# Patient Record
Sex: Male | Born: 1965 | Hispanic: Yes | Marital: Married | Smoking: Former smoker
Health system: Southern US, Community
[De-identification: ages and names within clinical notes are randomized; demographics above are authoritative.]

## PROBLEM LIST (undated history)

## (undated) DIAGNOSIS — Z973 Presence of spectacles and contact lenses: Secondary | ICD-10-CM

## (undated) DIAGNOSIS — J189 Pneumonia, unspecified organism: Secondary | ICD-10-CM

## (undated) DIAGNOSIS — J45909 Unspecified asthma, uncomplicated: Secondary | ICD-10-CM

## (undated) DIAGNOSIS — G4730 Sleep apnea, unspecified: Secondary | ICD-10-CM

## (undated) DIAGNOSIS — I2510 Atherosclerotic heart disease of native coronary artery without angina pectoris: Secondary | ICD-10-CM

## (undated) DIAGNOSIS — T68XXXA Hypothermia, initial encounter: Secondary | ICD-10-CM

## (undated) DIAGNOSIS — E039 Hypothyroidism, unspecified: Secondary | ICD-10-CM

## (undated) DIAGNOSIS — E785 Hyperlipidemia, unspecified: Secondary | ICD-10-CM

## (undated) HISTORY — PX: CARDIAC CATHETERIZATION: SHX172

## (undated) HISTORY — PX: ANTERIOR CRUCIATE LIGAMENT REPAIR: SHX115

## (undated) HISTORY — DX: Hypothermia, initial encounter: T68.XXXA

## (undated) HISTORY — DX: Hyperlipidemia, unspecified: E78.5

## (undated) HISTORY — PX: SHOULDER ARTHROSCOPY: SHX128

## (undated) HISTORY — DX: Pneumonia, unspecified organism: J18.9

## (undated) HISTORY — PX: MEDIAL COLLATERAL LIGAMENT REPAIR, KNEE: SHX2019

## (undated) HISTORY — DX: Sleep apnea, unspecified: G47.30

---

## 2004-10-11 ENCOUNTER — Ambulatory Visit: Payer: Self-pay | Admitting: Internal Medicine

## 2005-01-03 ENCOUNTER — Ambulatory Visit: Payer: Self-pay | Admitting: Internal Medicine

## 2005-02-17 ENCOUNTER — Other Ambulatory Visit: Payer: Self-pay

## 2005-02-17 ENCOUNTER — Emergency Department: Payer: Self-pay | Admitting: Emergency Medicine

## 2005-02-25 ENCOUNTER — Ambulatory Visit: Payer: Self-pay | Admitting: Internal Medicine

## 2005-03-16 ENCOUNTER — Ambulatory Visit: Payer: Self-pay | Admitting: Cardiovascular Disease

## 2009-01-06 ENCOUNTER — Emergency Department: Payer: Self-pay | Admitting: Emergency Medicine

## 2009-01-13 ENCOUNTER — Ambulatory Visit: Payer: Self-pay | Admitting: Sports Medicine

## 2009-02-12 ENCOUNTER — Ambulatory Visit: Payer: Self-pay | Admitting: *Deleted

## 2014-05-12 ENCOUNTER — Inpatient Hospital Stay: Payer: Self-pay | Admitting: Internal Medicine

## 2014-05-12 LAB — URINALYSIS, COMPLETE
Bacteria: NONE SEEN
Bilirubin,UR: NEGATIVE
Blood: NEGATIVE
Glucose,UR: NEGATIVE mg/dL (ref 0–75)
Ketone: NEGATIVE
Leukocyte Esterase: NEGATIVE
Nitrite: NEGATIVE
PROTEIN: NEGATIVE
Ph: 7 (ref 4.5–8.0)
RBC, UR: NONE SEEN /HPF (ref 0–5)
Specific Gravity: 1.016 (ref 1.003–1.030)
Squamous Epithelial: NONE SEEN

## 2014-05-12 LAB — PROTIME-INR
INR: 1
Prothrombin Time: 13 secs (ref 11.5–14.7)

## 2014-05-12 LAB — CBC WITH DIFFERENTIAL/PLATELET
Basophil #: 0.1 10*3/uL (ref 0.0–0.1)
Basophil %: 0.6 %
EOS ABS: 0.1 10*3/uL (ref 0.0–0.7)
EOS PCT: 0.4 %
HCT: 48.7 % (ref 40.0–52.0)
HGB: 16.1 g/dL (ref 13.0–18.0)
LYMPHS PCT: 6.6 %
Lymphocyte #: 0.9 10*3/uL — ABNORMAL LOW (ref 1.0–3.6)
MCH: 28.8 pg (ref 26.0–34.0)
MCHC: 33 g/dL (ref 32.0–36.0)
MCV: 87 fL (ref 80–100)
Monocyte #: 0.6 x10 3/mm (ref 0.2–1.0)
Monocyte %: 4.3 %
NEUTROS ABS: 12.6 10*3/uL — AB (ref 1.4–6.5)
NEUTROS PCT: 88.1 %
PLATELETS: 220 10*3/uL (ref 150–440)
RBC: 5.59 10*6/uL (ref 4.40–5.90)
RDW: 14.2 % (ref 11.5–14.5)
WBC: 14.3 10*3/uL — ABNORMAL HIGH (ref 3.8–10.6)

## 2014-05-12 LAB — COMPREHENSIVE METABOLIC PANEL
ALBUMIN: 4.3 g/dL (ref 3.4–5.0)
AST: 31 U/L (ref 15–37)
Alkaline Phosphatase: 97 U/L
Anion Gap: 7 (ref 7–16)
BUN: 18 mg/dL (ref 7–18)
Bilirubin,Total: 0.5 mg/dL (ref 0.2–1.0)
CALCIUM: 9.4 mg/dL (ref 8.5–10.1)
Chloride: 103 mmol/L (ref 98–107)
Co2: 29 mmol/L (ref 21–32)
Creatinine: 1.03 mg/dL (ref 0.60–1.30)
EGFR (Non-African Amer.): >60
Glucose: 124 mg/dL — ABNORMAL HIGH (ref 65–99)
Osmolality: 281 (ref 275–301)
Potassium: 3.8 mmol/L (ref 3.5–5.1)
SGPT (ALT): 40 U/L
SODIUM: 139 mmol/L (ref 136–145)
Total Protein: 8.4 g/dL — ABNORMAL HIGH (ref 6.4–8.2)

## 2014-05-12 LAB — CSF CELL CT + PROT + GLU PANEL
CSF Tube #: 1
Eosinophil: 0 %
Glucose, CSF: 69 mg/dL (ref 40–75)
Lymphocytes: 0 %
Monocytes/Macrophages: 0 %
NEUTROS PCT: 0 %
OTHER CELLS: 0 %
Protein, CSF: 40 mg/dL (ref 15–45)
RBC (CSF): 0 /mm3
WBC (CSF): 3 /mm3

## 2014-05-12 LAB — MAGNESIUM: Magnesium: 1.9 mg/dL

## 2014-05-12 LAB — PHOSPHORUS: PHOSPHORUS: 1.5 mg/dL — AB (ref 2.5–4.9)

## 2014-05-12 LAB — HEMOGLOBIN A1C: Hemoglobin A1C: 6.1 % (ref 4.2–6.3)

## 2014-05-12 LAB — TSH: THYROID STIMULATING HORM: 1.36 u[IU]/mL

## 2014-05-12 LAB — RAPID HIV SCREEN (HIV 1/2 AB+AG)

## 2014-05-12 LAB — TROPONIN I: Troponin-I: <0.02 ng/mL

## 2014-05-13 LAB — BASIC METABOLIC PANEL
ANION GAP: 9 (ref 7–16)
BUN: 12 mg/dL (ref 7–18)
CALCIUM: 8.3 mg/dL — AB (ref 8.5–10.1)
Chloride: 104 mmol/L (ref 98–107)
Co2: 24 mmol/L (ref 21–32)
Creatinine: 1 mg/dL (ref 0.60–1.30)
Glucose: 125 mg/dL — ABNORMAL HIGH (ref 65–99)
Osmolality: 275 (ref 275–301)
POTASSIUM: 3.5 mmol/L (ref 3.5–5.1)
Sodium: 137 mmol/L (ref 136–145)

## 2014-05-13 LAB — CBC WITH DIFFERENTIAL/PLATELET
BASOS ABS: 0 10*3/uL (ref 0.0–0.1)
Basophil %: 0.2 %
EOS ABS: 0 10*3/uL (ref 0.0–0.7)
EOS PCT: 0.1 %
HCT: 43.3 % (ref 40.0–52.0)
HGB: 14.3 g/dL (ref 13.0–18.0)
Lymphocyte #: 0.4 10*3/uL — ABNORMAL LOW (ref 1.0–3.6)
Lymphocyte %: 5.3 %
MCH: 28.9 pg (ref 26.0–34.0)
MCHC: 33 g/dL (ref 32.0–36.0)
MCV: 87 fL (ref 80–100)
MONO ABS: 0.5 x10 3/mm (ref 0.2–1.0)
Monocyte %: 6.4 %
NEUTROS ABS: 6.8 10*3/uL — AB (ref 1.4–6.5)
NEUTROS PCT: 88 %
Platelet: 158 10*3/uL (ref 150–440)
RBC: 4.96 10*6/uL (ref 4.40–5.90)
RDW: 14 % (ref 11.5–14.5)
WBC: 7.8 10*3/uL (ref 3.8–10.6)

## 2014-05-14 LAB — BASIC METABOLIC PANEL
Anion Gap: 3 — ABNORMAL LOW (ref 7–16)
BUN: 7 mg/dL (ref 7–18)
Calcium, Total: 7.9 mg/dL — ABNORMAL LOW (ref 8.5–10.1)
Chloride: 108 mmol/L — ABNORMAL HIGH (ref 98–107)
Co2: 27 mmol/L (ref 21–32)
Creatinine: 0.92 mg/dL (ref 0.60–1.30)
EGFR (Non-African Amer.): >60
Glucose: 94 mg/dL (ref 65–99)
Osmolality: 273 (ref 275–301)
POTASSIUM: 3.9 mmol/L (ref 3.5–5.1)
Sodium: 138 mmol/L (ref 136–145)

## 2014-05-14 LAB — VANCOMYCIN, TROUGH: VANCOMYCIN, TROUGH: 7 ug/mL — AB (ref 10–20)

## 2014-05-15 LAB — CSF CULTURE W GRAM STAIN

## 2014-05-17 LAB — CULTURE, BLOOD (SINGLE)

## 2014-09-12 DIAGNOSIS — J189 Pneumonia, unspecified organism: Secondary | ICD-10-CM

## 2014-09-12 HISTORY — DX: Pneumonia, unspecified organism: J18.9

## 2015-01-03 NOTE — H&P (Signed)
PATIENT NAME:  Erik Bush, CASIQUE MR#:  [id number] DATE OF BIRTH:  1966-06-20  DATE OF ADMISSION:  05/12/2014  PRIMARY CARE PHYSICIAN: Dr. Rosario Jacks.   CHIEF COMPLAINT: Rigors and back pain.   HISTORY OF PRESENT ILLNESS: This is a 49 year old male who presents to the hospital complaining of chills and rigors that began at work this morning. The patient said he was in his usual state of health was feeling fine when this morning when he went to work he started developing significant rigors and chills. He also started to have generalized body ache and some back pain. His boss then drove him to the ER for further evaluation. In the Emergency Room, the patient did have a fever of 103 rectally. He was noted to have leukocytosis. The patient met criteria for sepsis given his leukocytosis, tachycardia, and fever. Hospitalist services were contacted for further treatment and evaluation. The patient does admit to a headache and some neck stiffness but no nausea, no vomiting, no chest pain, no shortness of breath, no abdominal pain, diarrhea. He also complains of lower back pain, but no history of recent trauma or no open wound to the area.   REVIEW OF SYSTEMS:  CONSTITUTIONAL: Positive documented fever. No weight gain, no weight loss.  EYES: No blurry or double vision.  ENT: No tinnitus. No postnasal drip. No redness of the oropharynx.  RESPIRATORY: No cough, no wheeze, no hemoptysis, no dyspnea.  CARDIOVASCULAR: No chest pain, no orthopnea, no palpitations, or syncope.  GASTROINTESTINAL: No nausea, no vomiting, diarrhea. No abdominal pain. No melena or hematochezia.  GENITOURINARY: No dysuria and hematuria.  ENDOCRINE: No polyuria or nocturia. No heat or cold intolerance. HEMATOLOGIC: No anemia, no bruising, no bleeding.  INTEGUMENTARY: No rashes, no lesions.  MUSCULOSKELETAL: No arthritis, no swelling, no gout.  NEUROLOGIC: No numbness or tingling. No ataxia. No seizure-type activity.  PSYCHIATRIC: No  anxiety. No insomnia. No ADD.   PAST MEDICAL HISTORY: Consistent with diabetes and hypothyroidism.   ALLERGIES: TO ASPIRIN, WHICH CAUSES ANAPHYLAXIS.   SOCIAL HISTORY: No smoking. No alcohol abuse. No illicit drug abuse. Lives by himself.   FAMILY HISTORY: The patient's mother is alive and healthy. Father died from complications of a heart attack.   CURRENT MEDICATIONS: He is currently on no medications.   PHYSICAL EXAMINATION:  VITALS SIGNS: Presently is as follows: Temperature is 99, pulse 108, respirations 28, blood pressure 100/72, sats 94% on room air.  GENERAL: He is a pleasant-appearing male in no apparent distress.  HEAD, EYES, EARS, NOSE, AND THROAT: Atraumatic, normocephalic. Extraocular muscles are intact. Pupils equal and reactive to light. Sclerae anicteric. No conjunctival injection. No pharyngeal erythema.  NECK: Supple. There is no jugular venous distention. No bruits, no lymphadenopathy, no thyromegaly.  HEART: Regular rate, rhythm, tachycardic. No murmurs, no rubs, no clicks.  LUNGS: Clear to auscultation bilaterally. No rales, rhonchi, no wheezes.  ABDOMEN: Soft, flat, nontender, nondistended. Has good bowel sounds. No hepatosplenomegaly appreciated.  EXTREMITIES: No evidence of any cyanosis, clubbing, or peripheral edema. Has +2 pedal and radial pulses bilaterally.  NEUROLOGICAL: The patient is alert, awake, and oriented x 3 with no focal motor or sensory deficits distribution bilaterally.  SKIN: Moist and warm with no rashes appreciated.  LYMPHATIC: There is no cervical or axillary lymphadenopathy.   LABORATORY AND RADIOLOGICAL DATA: Serum glucose of 124, BUN 18, creatinine 1.03, sodium 139, potassium 3.8, chloride 103, bicarbonate 29. LFTs are within normal limits. Troponin less than 0.02. White cell count 14.3, hemoglobin 16.1, hematocrit 48.7,  platelet count of 220,000. INR is 1.0. Urinalysis within normal limits.   The patient did have a chest x-ray done which  showed low lung volumes and right basilar atelectasis.   ASSESSMENT AND PLAN: This is a 50 year old male with a history of diet-controlled diabetes, hypothyroidism, who presented to the hospital with rigors, fevers, and generalized body aches.   PROBLEMS:  1. Sepsis. This is the likely diagnosis given the patient's presenting symptoms of fever, tachycardia, and leukocytosis. The source of the sepsis is currently unclear. The patient's chest x-ray is negative. Urinalysis is negative. He has no upper respiratory symptoms. Given his headache and some neck stiffness, he underwent a lumbar puncture done in the Emergency Room, the results of which are still pending. My suspicion for meningitis, although, was on the low side. I will empirically treat the patient with IV vancomycin and Zosyn, follow blood cultures, follow fever curve, and follow him clinically.  2. Diet-controlled diabetes. The patient is currently not taking any medications. I will place him on sliding scale insulin, carbohydrate-controlled diet, check a hemoglobin A1c.  3. Hypothyroidism. The patient currently not on any medications. Check a TSH. 4. Leukocytosis. This was likely due to the sepsis. I will follow white cell count with IV antibiotic therapy.   CODE STATUS: The patient is a full code.   TIME SPENT: 50 minutes.    ____________________________ Belia Heman. Verdell Carmine, MD vjs:lt D: 05/12/2014 14:07:56 ET T: 05/12/2014 14:45:32 ET JOB#: [id number]  cc: Belia Heman. Verdell Carmine, MD, <Dictator> Henreitta Leber MD ELECTRONICALLY SIGNED 05/21/2014 15:48

## 2015-01-03 NOTE — Discharge Summary (Signed)
PATIENT NAME:  Erik Bush, Erik Bush MR#:  [id number] DATE OF BIRTH:  [birth year]  DATE OF ADMISSION:  05/12/2014 DATE OF DISCHARGE:  05/14/2014  ADMITTING PHYSICIAN: Belia Heman. Verdell Carmine, MD  DISCHARGING PHYSICIAN: Gladstone Lighter, MD   PRIMARY MEDICAL DOCTOR: Dr. Loretha Stapler IN THE HOSPITAL: ID consultation by Dr. Ola Spurr.   DISCHARGE DIAGNOSES:  1.  Sepsis secondary to pneumonia, viral or bacterial.  2.  Hypothyroidism.  3.  Chronic low back pain.  DISCHARGE HOME MEDICATIONS: Augmentin 875 mg tablet, 1 tablet p.o. b.i.d. for 8 days.   DISCHARGE DIET: Low-sodium diet.   DISCHARGE ACTIVITY: As tolerated.  FOLLOWUP INSTRUCTIONS:  1.  PCP followup in 2 weeks.  2.  If fevers do not resolve, follow up with Dr. Ola Spurr in 1 week.  LABORATORY AND IMAGING STUDIES PRIOR TO DISCHARGE: Sodium 138, potassium 3.9, chloride 108, bicarbonate 27, BUN 7, creatinine 0.92, glucose 94, and calcium of 7.9.   WBC 7.8, hemoglobin 14.3, hematocrit 43.3, platelet count 158,000. Chest x-ray showing left lower lobe opacity, nonspecific, could be from atelectasis versus infiltrate. CT of the chest done without contrast showing bibasilar atelectasis. No evidence of pneumonia. Age advanced atherosclerosis, hepatic steatosis noted. CSF cultures were negative. CSF negative for any infection. TSH within normal limits. Urinalysis negative for any infection. Blood cultures were negative. HbA1c is 6.1. Also, HIV antibody test is negative.  BRIEF HOSPITAL COURSE: Erik Bush is a 49 year old with no significant past medical history, not taking any medications at home, presents to the hospital secondary to sudden onset of high-grade fevers. He had a temperature of 102 degrees Fahrenheit. Fevers of unknown origin with sepsis. Cultures are negative. No source of infection was identified except clinical exam revealed left basilar crackles. Chest x-ray revealed possible infiltrate; however, CT mentioned atelectasis.  He could still have early pneumonia, either viral or bacterial. He was on broad-spectrum antibiotics. Once the cultures were negative, he was tapered to oral antibiotics. Being discharged on oral augmentin. Seen by ID consult in the hospital, and patient has not had further fevers after the first few hours. He had some back pain; however, he said that was his chronic low back pain and it got worse with fever. With afebrile status he has not had further fevers, so no further workup was pursued since his cultures were negative. His course has been otherwise uneventful in the hospital.   DISCHARGE CONDITION: Stable.   DISCHARGE DISPOSITION: Home.  TIME SPENT ON DISCHARGE: 45 minutes.   ____________________________ Gladstone Lighter, MD rk:ST D: 05/14/2014 14:04:28 ET T: 05/14/2014 21:46:21 ET JOB#: [id number]  cc: Gladstone Lighter, MD, <Dictator> Cheral Marker. Ola Spurr, MD Gladstone Lighter MD ELECTRONICALLY SIGNED 05/15/2014 14:07

## 2015-01-03 NOTE — Consult Note (Signed)
PATIENT NAME:  Erik Bush, Erik Bush MR#:  [id number] DATE OF BIRTH:  Apr 08, 1966  DATE OF CONSULTATION:  05/13/2014  REFERRING PHYSICIAN:  Dr. Verdell Carmine CONSULTING PHYSICIAN:  Cheral Marker. Ola Spurr, MD  REASON FOR CONSULTATION: Fever of unknown origin.   HISTORY OF PRESENT ILLNESS: This is a very pleasant 49 year old gentleman with no known past medical history, who works in a [employer] as a Librarian, academic. He was in his usual state of health for the last several days until yesterday when he began with the sudden onset of chills and rigors while he was at work. He was also having generalized body aches and back pain. He came to the Emergency Room where he was found to have an ER of 103, as well as a leukocytosis. He was admitted and started on IV antibiotics. Since then, he reports feeling somewhat better with less body aches.   The patient has had no travel recently, no sick contacts. He lives alone, but is sexually active with his partner. He has no children. He has 2 pet dogs at home but no other animal contact and no tick bites or insect bites recently. He has done no camping or outdoor activity in the last several weeks.   He does report yesterday when the onset came on he was having some difficulty breathing and pain in the left side of his chest. This was associated with some shortness of breath.   PAST MEDICAL HISTORY: None.   PAST SURGICAL HISTORY: None.   SOCIAL HISTORY: No tobacco, alcohol or drugs. Lives by himself. The rest as above.   FAMILY HISTORY: Positive for father with heart attacks mother is alive.   MEDICATIONS: None.   ALLERGIES: ASPIRIN.   REVIEW OF SYSTEMS: Eleven systems reviewed and negative except as per HPI.   ANTIBIOTICS SINCE ADMISSION: Include vancomycin, Zosyn, and doxycycline.   PHYSICAL EXAMINATION: VITAL SIGNS: T-max since admission 102.3, currently 98.4, pulse 89, blood pressure 116/78, respirations 18, saturating 95% on room air.  GENERAL: He is pleasant,  interactive, in no acute distress. He is overweight.  HEENT: Pupils are equal, round, and reactive to light and accommodation. Extraocular movements are intact. Sclerae are anicteric. His oropharynx is clear. There is no thrush.  NECK: Supple. No anterior cervical, posterior cervical, or supraclavicular lymphadenopathy.  HEART: Tachycardic but regular.  LUNGS: He has crackles at the left base but good air movement. No wheeze.  ABDOMEN: Soft, nontender, nondistended. No hepatosplenomegaly.  SPINE: He has mild tenderness over his mid spine, but this is not severe. He has no CVA tenderness.  EXTREMITIES: No clubbing, cyanosis, or edema.  NEUROLOGIC: He is alert and oriented x 3. Grossly nonfocal neurologic exam.  SKIN: He has multiple tattoos, but no evidence of stigmata of endocarditis.   DATA: White blood count on admission was 14.3, down to 7.8. Differential on admission showed 12.6 neutrophils, hemoglobin stable at 14.3, platelets 158. Lymphocyte count was low at 0.9. INR was 1.0. CSF showed 3 white cells, 0 red cells, normal protein and glucose. Blood cultures x 2 are no growth to date. CSF culture is negative. Urinalysis is negative. HIV antibody test is negative. Lactic acid was elevated at 2.0. LFTs were normal. Troponin was negative. TSH was normal. Renal panel was normal.   IMAGING: Chest x-ray done on admission showed low lung volumes with right basilar atelectasis, upper normal heart size. No other findings.   IMPRESSION: A 49 year old gentleman admitted with sudden onset of fevers and chills, as well as some back pain, generalized  body aches, and some chest pain and dyspnea. His lab work showed an elevated white count with a neutrophilic predominance. Cultures were negative to date. On exam, he has some crackles at the left base. He has no other unusual exposures. His HIV test is negative.   RECOMMENDATIONS: 1.  Continue vancomycin, Zosyn, and doxycycline for now.  2.  Check and repeat  chest x-ray today.  3.  Await further culture results.  4.  Based on further above information can tailor antibiotics.   Thank you for the consult. I would be glad to follow with you.    ____________________________ Cheral Marker. Ola Spurr, MD dpf:at D: 05/13/2014 08:43:00 ET T: 05/13/2014 10:30:24 ET JOB#: [id number]  cc: Cheral Marker. Ola Spurr, MD, <Dictator> Nira Visscher Ola Spurr MD ELECTRONICALLY SIGNED 05/22/2014 10:22

## 2015-05-27 ENCOUNTER — Encounter: Payer: Self-pay | Admitting: *Deleted

## 2015-06-03 ENCOUNTER — Ambulatory Visit (INDEPENDENT_AMBULATORY_CARE_PROVIDER_SITE_OTHER): Payer: Commercial Indemnity | Admitting: General Surgery

## 2015-06-03 ENCOUNTER — Encounter: Payer: Self-pay | Admitting: General Surgery

## 2015-06-03 VITALS — BP 122/62 | HR 78 | Resp 12 | Ht 67.0 in | Wt 167.0 lb

## 2015-06-03 DIAGNOSIS — K603 Anal fistula: Secondary | ICD-10-CM

## 2015-06-03 NOTE — Patient Instructions (Addendum)
Anal Fistula An anal fistula is an abnormal tunnel that develops between the bowel and skin near the outside of the anus, where feces comes out. The anus has a number of tiny glands that make lubricating fluid. Sometimes these glands can become plugged and infected. This may lead to the development of a fluid-filled pocket (abscess). An anal fistula often develops after this infection or abscess. It is nearly always caused by a past or current anal abscess.  CAUSES  Though an anal fistula is almost always caused by a past or current anal abscess, other causes can include:  A complication of surgery.  Trauma to the rectal area.  Radiation to the area.  Other medical conditions or diseases, such as:   Chronic inflammatory bowel disease, such as Crohn disease or ulcerative colitis.   Colon or rectal cancer.   Diverticular disease, such as diverticulitis.   A sexually transmitted disease, such as gonorrhea, chlamydia, or syphilis.  An HIV infection or AIDS.  SYMPTOMS   Throbbing or constant pain that may be worse when sitting.   Swelling or irritation around the anus.   Drainage of pus or blood from an opening near the anus.   Pain with bowel movements.  Fever or chills. DIAGNOSIS  Your caregiver will examine the area to find the openings of the anal fistula and the fistula tract. The external opening of the anal fistula may be seen during a physical examination. Other examinations that may be performed include:   Examination of the rectal area with a gloved hand (digital rectal exam).   Examination with a probe or scope to help locate the internal opening of the fistula.   Injection of a dye into the fistula opening. X-rays can be taken to find the exact location and path of the fistula.   An MRI or ultrasound of the anal area.  Other tests may be performed to find the cause of the anal fistula.   TREATMENT  The most common treatment for an anal fistula is  surgery. There are different surgery options depending on where your fistula is located and how complex the fistula is. Surgical options include:  A fistulotomy. This surgery involves opening up the whole fistula and draining the contents inside to promote healing.  Seton placement. A silk string (seton) is placed into the fistula during a fistulotomy to drain any infection to promote healing.  Advancement flap procedure. Tissue is removed from your rectum or the skin around the anus and is attached to the opening of the fistula.  Bioprosthetic plug. A cone-shaped plug is made from your tissue and is used to block the opening of the fistula. Some anal fistulas do not require surgery. A fibrin glue is a non-surgical option that involves injecting the glue to seal the fistula. You also may be prescribed an antibiotic medicine to treat an infection.  HOME CARE INSTRUCTIONS   Take your antibiotics as directed. Finish them even if you start to feel better.  Only take over-the-counter or prescription medicines as directed by your caregiver.Use a stool softener or laxative, if recommended.   Eat a high-fiber diet to help avoid constipation or as directed by your caregiver.  Drink enough water to keep your urine clear or pale yellow.   A warm sitz bath may be soothing and help with healing. You may take warm sitz baths for 15-20 minutes, 3-4 times a day to ease pain and discomfort.   Follow excellent hygiene to keep the anal area  as clean and dry as possible. Use wet toilet paper or moist towelettes after each bowel movement.  SEEK MEDICAL CARE IF: You have increased pain not controlled with medicines.  SEEK IMMEDIATE MEDICAL CARE IF:  You have severe, intolerable pain.  You have new swelling, redness, or discharge around the anal area.  You have tenderness or warmth around the anal area.  You have chills or diarrhea.  You have severe problems urinating or having a bowel movement.    You have a fever or persistent symptoms for more than 2-3 days.   You have a fever and your symptoms suddenly get worse.  MAKE SURE YOU:   Understand these instructions.  Will watch your condition.  Will get help right away if you are not doing well or get worse. Document Released: 08/11/2008 Document Revised: 08/15/2012 Document Reviewed: 07/04/2011 [facility] Patient Information 2015 [place], [place]. This information is not intended to replace advice given to you by your health care provider. Make sure you discuss any questions you have with your health care provider.  Patient's surgery has been scheduled for 06-08-15 at [facility].

## 2015-06-03 NOTE — Progress Notes (Signed)
[name], male   DOB: 07-29-1966, 49 y.o.   MRN: [id number]  Chief Complaint  Patient presents with  . Other    abscess on anal area    HPI Erik Bush is a 49 y.o. male here today for a evaluation  an anal abscess. He states she noticed this about three months ago. He states the area comes and goes.No drainage noticed, some bleeding. HPI  Past Medical History  Diagnosis Date  . Pneumonia   . Sleep apnea   . Hypothermia     Past Surgical History  Procedure Laterality Date  . Anterior cruciate ligament repair      knees  . Shoulder arthroscopy Right   . Medial collateral ligament repair, knee      Family History  Problem Relation Age of Onset  . Thyroid disease Father   . Heart attack      Social History Social History  Substance Use Topics  . Smoking status: Former Smoker -- 6 years    Quit date: 09/12/1989  . Smokeless tobacco: None  . Alcohol Use: 0.0 oz/week    0 Standard drinks or equivalent per week     Comment: occasionally    Allergies  Allergen Reactions  . Aspirin Itching and Swelling    Current Outpatient Prescriptions  Medication Sig Dispense Refill  . levothyroxine (SYNTHROID, LEVOTHROID) 125 MCG tablet Take 125 mcg by mouth daily before breakfast.     No current facility-administered medications for this visit.    Review of Systems Review of Systems  Constitutional: Negative.   Respiratory: Negative.   Cardiovascular: Negative.   Gastrointestinal: Negative.     Blood pressure 122/62, pulse 78, resp. rate 12, height 5\' 7"  (1.702 m), weight 167 lb (75.751 kg).  Physical Exam Physical Exam  Constitutional: He is oriented to person, place, and time. He appears well-developed and well-nourished.  Eyes: Conjunctivae are normal. No scleral icterus.  Neck: Neck supple.  Cardiovascular: Normal rate, regular rhythm and normal heart sounds.   Pulmonary/Chest: Effort normal and breath sounds normal.  Abdominal: Soft. Normal  appearance and bowel sounds are normal. There is no tenderness. A hernia ( 5-8NI reducible umbilical hernia) is present.  Genitourinary:     Lymphadenopathy:    He has no cervical adenopathy.  Neurological: He is alert and oriented to person, place, and time.  Skin: Skin is warm and dry.    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      Plan    Surgical treatment discussed with pt and he is agreeable.   Patient's surgery has been scheduled for 06-08-15 at [facility].     PCP:  Kris Hartmann 06/03/2015, 1:13 PM

## 2015-06-04 ENCOUNTER — Other Ambulatory Visit: Payer: Self-pay

## 2015-06-04 ENCOUNTER — Encounter: Payer: Self-pay | Admitting: *Deleted

## 2015-06-04 NOTE — Patient Instructions (Signed)
  Your procedure is scheduled on: 06-08-15 Report to [place]. To find out your arrival time please call [phone] between 1PM - 3PM on 06-05-15  Remember: Instructions that are not followed completely may result in serious medical risk, up to and including death, or upon the discretion of your surgeon and anesthesiologist your surgery may need to be rescheduled.    _X___ 1. Do not eat food or drink liquids after midnight. No gum chewing or hard candies.     _X___ 2. No Alcohol for 24 hours before or after surgery.   ____ 3. Bring all medications with you on the day of surgery if instructed.    ____ 4. Notify your doctor if there is any change in your medical condition     (cold, fever, infections).     Do not wear jewelry, make-up, hairpins, clips or nail polish.  Do not wear lotions, powders, or perfumes. You may wear deodorant.  Do not shave 48 hours prior to surgery. Men may shave face and neck.  Do not bring valuables to the hospital.    [facility] is not responsible for any belongings or valuables.               Contacts, dentures or bridgework may not be worn into surgery.  Leave your suitcase in the car. After surgery it may be brought to your room.  For patients admitted to the hospital, discharge time is determined by your treatment team.   Patients discharged the day of surgery will not be allowed to drive home.   Please read over the following fact sheets that you were given:      _X___ Take these medicines the morning of surgery with A SIP OF WATER:    1. LEVOTHYROXINE  2.   3.   4.  5.  6.  _X___ Fleet Enema (as directed)-DO FLEETS ENEMA 1 HOUR PRIOR TO ARRIVAL TIME TO HOSPITAL   ____ Use CHG Soap as directed  ____ Use inhalers on the day of surgery  ____ Stop metformin 2 days prior to surgery    ____ Take 1/2 of usual insulin dose the night before surgery and none on the morning of surgery.   ____ Stop  Coumadin/Plavix/aspirin-N/A  ____ Stop Anti-inflammatories-TYLENOL OK   ____ Stop supplements until after surgery.    ____ Bring C-Pap to the hospital.

## 2015-06-08 ENCOUNTER — Ambulatory Visit: Payer: Managed Care, Other (non HMO) | Admitting: *Deleted

## 2015-06-08 ENCOUNTER — Encounter: Payer: Self-pay | Admitting: *Deleted

## 2015-06-08 ENCOUNTER — Telehealth: Payer: Self-pay | Admitting: General Surgery

## 2015-06-08 ENCOUNTER — Encounter
Admission: RE | Disposition: A | Discharge status: Home / Self Care | Payer: Self-pay | Source: Ambulatory Visit | Attending: General Surgery

## 2015-06-08 ENCOUNTER — Ambulatory Visit
Admission: RE | Admit: 2015-06-08 | Discharge: 2015-06-08 | Disposition: A | Discharge status: Home / Self Care | Payer: Managed Care, Other (non HMO) | Source: Ambulatory Visit | Attending: General Surgery | Admitting: General Surgery

## 2015-06-08 DIAGNOSIS — Z886 Allergy status to analgesic agent status: Secondary | ICD-10-CM | POA: Insufficient documentation

## 2015-06-08 DIAGNOSIS — E669 Obesity, unspecified: Secondary | ICD-10-CM | POA: Diagnosis not present

## 2015-06-08 DIAGNOSIS — G4730 Sleep apnea, unspecified: Secondary | ICD-10-CM | POA: Insufficient documentation

## 2015-06-08 DIAGNOSIS — Z6833 Body mass index (BMI) 33.0-33.9, adult: Secondary | ICD-10-CM | POA: Insufficient documentation

## 2015-06-08 DIAGNOSIS — E039 Hypothyroidism, unspecified: Secondary | ICD-10-CM | POA: Diagnosis not present

## 2015-06-08 DIAGNOSIS — Z87891 Personal history of nicotine dependence: Secondary | ICD-10-CM | POA: Diagnosis not present

## 2015-06-08 DIAGNOSIS — K603 Anal fistula: Secondary | ICD-10-CM | POA: Diagnosis not present

## 2015-06-08 HISTORY — DX: Hypothyroidism, unspecified: E03.9

## 2015-06-08 HISTORY — PX: ANAL FISTULOTOMY: SHX6423

## 2015-06-08 SURGERY — ANAL FISTULOTOMY
Anesthesia: General | Wound class: Clean Contaminated

## 2015-06-08 MED ORDER — FENTANYL CITRATE (PF) 100 MCG/2ML IJ SOLN
INTRAMUSCULAR | Status: DC | PRN
  Administered 2015-06-08 (×2): 50 ug via INTRAVENOUS

## 2015-06-08 MED ORDER — OXYCODONE-ACETAMINOPHEN 5-325 MG PO TABS: 1.0000 | ORAL_TABLET | ORAL | Status: DC | PRN

## 2015-06-08 MED ORDER — LACTATED RINGERS IV SOLN
INTRAVENOUS | Status: DC
  Administered 2015-06-08 (×2): via INTRAVENOUS

## 2015-06-08 MED ORDER — EPHEDRINE SULFATE 50 MG/ML IJ SOLN
INTRAMUSCULAR | Status: DC | PRN
  Administered 2015-06-08 (×2): 5 mg via INTRAVENOUS

## 2015-06-08 MED ORDER — FAMOTIDINE 20 MG PO TABS
20.0000 mg | ORAL_TABLET | Freq: Once | ORAL | Status: AC
  Administered 2015-06-08: 20 mg via ORAL

## 2015-06-08 MED ORDER — ACETAMINOPHEN 10 MG/ML IV SOLN
INTRAVENOUS | Status: AC
  Filled 2015-06-08: qty 100

## 2015-06-08 MED ORDER — KETOROLAC TROMETHAMINE 30 MG/ML IJ SOLN
INTRAMUSCULAR | Status: DC | PRN
  Administered 2015-06-08: 30 mg via INTRAVENOUS

## 2015-06-08 MED ORDER — FLEET ENEMA 7-19 GM/118ML RE ENEM: 1.0000 | ENEMA | Freq: Once | RECTAL | Status: DC

## 2015-06-08 MED ORDER — ACETAMINOPHEN 10 MG/ML IV SOLN
INTRAVENOUS | Status: DC | PRN
  Administered 2015-06-08: 1000 mg via INTRAVENOUS

## 2015-06-08 MED ORDER — FAMOTIDINE 20 MG PO TABS
ORAL_TABLET | ORAL | Status: AC
  Administered 2015-06-08: 20 mg via ORAL
  Filled 2015-06-08: qty 1

## 2015-06-08 MED ORDER — ONDANSETRON HCL 4 MG/2ML IJ SOLN
INTRAMUSCULAR | Status: DC | PRN
  Administered 2015-06-08: 4 mg via INTRAVENOUS

## 2015-06-08 MED ORDER — HYDROMORPHONE HCL 1 MG/ML IJ SOLN: 0.2500 mg | INTRAMUSCULAR | Status: DC | PRN

## 2015-06-08 MED ORDER — LIDOCAINE HCL (PF) 1 % IJ SOLN
INTRAMUSCULAR | Status: AC
  Filled 2015-06-08: qty 30

## 2015-06-08 MED ORDER — BUPIVACAINE HCL (PF) 0.5 % IJ SOLN
INTRAMUSCULAR | Status: DC | PRN
  Administered 2015-06-08: 20 mL

## 2015-06-08 MED ORDER — MIDAZOLAM HCL 2 MG/2ML IJ SOLN
INTRAMUSCULAR | Status: DC | PRN
  Administered 2015-06-08: 3 mg via INTRAVENOUS

## 2015-06-08 MED ORDER — PROPOFOL 10 MG/ML IV BOLUS
INTRAVENOUS | Status: DC | PRN
  Administered 2015-06-08: 200 mg via INTRAVENOUS

## 2015-06-08 MED ORDER — BACITRACIN ZINC 500 UNIT/GM EX OINT
TOPICAL_OINTMENT | CUTANEOUS | Status: AC
  Filled 2015-06-08: qty 28.35

## 2015-06-08 MED ORDER — BACIT-POLY-NEO HC 1 % EX OINT
TOPICAL_OINTMENT | CUTANEOUS | Status: DC | PRN
  Administered 2015-06-08: 1

## 2015-06-08 MED ORDER — ONDANSETRON HCL 4 MG/2ML IJ SOLN: 4.0000 mg | Freq: Once | INTRAMUSCULAR | Status: DC | PRN

## 2015-06-08 MED ORDER — BUPIVACAINE HCL (PF) 0.5 % IJ SOLN
INTRAMUSCULAR | Status: AC
  Filled 2015-06-08: qty 30

## 2015-06-08 SURGICAL SUPPLY — 22 items
BLADE SURG 15 STRL SS SAFETY (BLADE) ×3 IMPLANT
BRIEF STRETCH MATERNITY 2XLG (MISCELLANEOUS) ×3 IMPLANT
CANISTER SUCT 1200ML W/VALVE (MISCELLANEOUS) ×3 IMPLANT
DRAPE LEGGINS SURG 28X43 STRL (DRAPES) ×3 IMPLANT
DRAPE PED LAPAROTOMY (DRAPES) ×3 IMPLANT
DRAPE UNDER BUTTOCK W/FLU (DRAPES) ×3 IMPLANT
GLOVE BIO SURGEON STRL SZ7 (GLOVE) ×15 IMPLANT
GOWN STRL REUS W/ TWL LRG LVL3 (GOWN DISPOSABLE) ×3 IMPLANT
GOWN STRL REUS W/TWL LRG LVL3 (GOWN DISPOSABLE) ×6
KIT RM TURNOVER CYSTO AR (KITS) ×3 IMPLANT
LABEL OR SOLS (LABEL) IMPLANT
NEEDLE HYPO 25X1 1.5 SAFETY (NEEDLE) ×3 IMPLANT
PACK BASIN MINOR ARMC (MISCELLANEOUS) ×3 IMPLANT
PAD GROUND ADULT SPLIT (MISCELLANEOUS) ×3 IMPLANT
PAD OB MATERNITY 4.3X12.25 (PERSONAL CARE ITEMS) ×3 IMPLANT
PAD PREP 24X41 OB/GYN DISP (PERSONAL CARE ITEMS) ×3 IMPLANT
SOL PREP PVP 2OZ (MISCELLANEOUS) ×3
SOLUTION PREP PVP 2OZ (MISCELLANEOUS) ×1 IMPLANT
SURGILUBE 2OZ TUBE FLIPTOP (MISCELLANEOUS) ×3 IMPLANT
SUT VIC AB 3-0 SH 27 (SUTURE) ×2
SUT VIC AB 3-0 SH 27X BRD (SUTURE) ×1 IMPLANT
SYR CONTROL 10ML (SYRINGE) ×3 IMPLANT

## 2015-06-08 NOTE — Discharge Instructions (Signed)
Anal Fistula °An anal fistula is an abnormal tunnel that develops between the bowel and skin near the outside of the anus, where feces comes out. The anus has a number of tiny glands that make lubricating fluid. Sometimes these glands can become plugged and infected. This may lead to the development of a fluid-filled pocket (abscess). An anal fistula often develops after this infection or abscess. It is nearly always caused by a past or current anal abscess.  °CAUSES  °Though an anal fistula is almost always caused by a past or current anal abscess, other causes can include: °· A complication of surgery. °· Trauma to the rectal area. °· Radiation to the area. °· Other medical conditions or diseases, such as:   °¨ Chronic inflammatory bowel disease, such as Crohn disease or ulcerative colitis.   °¨ Colon or rectal cancer.   °¨ Diverticular disease, such as diverticulitis.   °¨ A sexually transmitted disease, such as gonorrhea, chlamydia, or syphilis. °¨ An HIV infection or AIDS.   °SYMPTOMS  °· Throbbing or constant pain that may be worse when sitting.   °· Swelling or irritation around the anus.   °· Drainage of pus or blood from an opening near the anus.   °· Pain with bowel movements. °· Fever or chills. °DIAGNOSIS  °Your caregiver will examine the area to find the openings of the anal fistula and the fistula tract. The external opening of the anal fistula may be seen during a physical examination. Other examinations that may be performed include:  °· Examination of the rectal area with a gloved hand (digital rectal exam).   °· Examination with a probe or scope to help locate the internal opening of the fistula.   °· Injection of a dye into the fistula opening. X-rays can be taken to find the exact location and path of the fistula.   °· An MRI or ultrasound of the anal area.   °Other tests may be performed to find the cause of the anal fistula.    °TREATMENT  °The most common treatment for an anal fistula is  surgery. There are different surgery options depending on where your fistula is located and how complex the fistula is. Surgical options include: °· A fistulotomy. This surgery involves opening up the whole fistula and draining the contents inside to promote healing. °· Seton placement. A silk string (seton) is placed into the fistula during a fistulotomy to drain any infection to promote healing. °· Advancement flap procedure. Tissue is removed from your rectum or the skin around the anus and is attached to the opening of the fistula. °· Bioprosthetic plug. A cone-shaped plug is made from your tissue and is used to block the opening of the fistula. °Some anal fistulas do not require surgery. A fibrin glue is a non-surgical option that involves injecting the glue to seal the fistula. You also may be prescribed an antibiotic medicine to treat an infection.  °HOME CARE INSTRUCTIONS  °· Take your antibiotics as directed. Finish them even if you start to feel better. °· Only take over-the-counter or prescription medicines as directed by your caregiver. Use a stool softener or laxative, if recommended.   °· Eat a high-fiber diet to help avoid constipation or as directed by your caregiver. °· Drink enough water to keep your urine clear or pale yellow.   °· A warm sitz bath may be soothing and help with healing. You may take warm sitz baths for 15-20 minutes, 3-4 times a day to ease pain and discomfort.   °· Follow excellent hygiene to keep the anal area   as clean and dry as possible. Use wet toilet paper or moist towelettes after each bowel movement.   °SEEK MEDICAL CARE IF: °You have increased pain not controlled with medicines.  °SEEK IMMEDIATE MEDICAL CARE IF: °· You have severe, intolerable pain. °· You have new swelling, redness, or discharge around the anal area. °· You have tenderness or warmth around the anal area. °· You have chills or diarrhea. °· You have severe problems urinating or having a bowel movement.    °· You have a fever or persistent symptoms for more than 2-3 days.   °· You have a fever and your symptoms suddenly get worse.   °MAKE SURE YOU:  °· Understand these instructions. °· Will watch your condition. °· Will get help right away if you are not doing well or get worse. °Document Released: 08/11/2008 Document Revised: 08/15/2012 Document Reviewed: 07/04/2011 °[employer]® Patient Information ©2015 [employer]. This information is not intended to replace advice given to you by your health care provider. Make sure you discuss any questions you have with your health care provider. ° °

## 2015-06-08 NOTE — Telephone Encounter (Signed)
PT HAD ANAL FISTULOTOMY 06-08-15 BY DR Jamal Collin.HE WOULD LIKE TO KNOW WHEN HE CAN RET TO WORK? HE WOULD LIKE TO RET Wednesday 06-10-15.

## 2015-06-08 NOTE — Progress Notes (Signed)
   06/08/15 0900  Clinical Encounter Type  Visited With Patient and family together  Visit Type Initial  Provided pastoral presence and support to patient in same day surgery.  [place] [phone]

## 2015-06-08 NOTE — Op Note (Signed)
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  Complications: None  EBL: Minimal  Drains: None  Description: This patient was put to sleep in supine position happening table and then placed in lithotomy position. Anal area was prepped and draped sterile field.. Digital and speculum examination of the rectum was performed showing no apparent abnormality within the anal canal. At about the 8:00 location about a centimeter and a half away from the anal opening was a small fluctuant area. This was felt to be the side of this abscess and fistula. This area was opened with a 5 mm incision and a small amount of purulent material was drained out. With the probe and a hemostat at tract was identified going towards the anal area and ended just within the anal opening. The sphincter muscle was not involved. This tract was laid open and the unhealthy tissue cauterized. The wound was then left open to heal secondarily. Area was covered with the bacitracin ointment and dressed. Procedure was well-tolerated with no immediate problems encountered. Patient advanced and returned recovery room in stable condition.

## 2015-06-08 NOTE — Anesthesia Procedure Notes (Signed)
Procedure Name: LMA Insertion Date/Time: 06/08/2015 9:44 AM Performed by: Jonna Clark Pre-anesthesia Checklist: Patient identified, Patient being monitored, Timeout performed, Emergency Drugs available and Suction available Patient Re-evaluated:Patient Re-evaluated prior to inductionOxygen Delivery Method: Circle system utilized Preoxygenation: Pre-oxygenation with 100% oxygen Intubation Type: IV induction Ventilation: Mask ventilation without difficulty LMA: LMA inserted LMA Size: 4.5 Tube type: Oral Number of attempts: 1 Placement Confirmation: positive ETCO2 and breath sounds checked- equal and bilateral Tube secured with: Tape Dental Injury: Teeth and Oropharynx as per pre-operative assessment

## 2015-06-08 NOTE — Anesthesia Postprocedure Evaluation (Signed)
  Anesthesia Post-op Note  Patient: Erik Bush  Procedure(s) Performed: Procedure(s): ANAL FISTULOTOMY (N/A)  Anesthesia type:General  Patient location: PACU  Post pain: Pain level controlled  Post assessment: Post-op Vital signs reviewed, Patient's Cardiovascular Status Stable, Respiratory Function Stable, Patent Airway and No signs of Nausea or vomiting  Post vital signs: Reviewed and stable  Last Vitals:  Filed Vitals:   06/08/15 1023  BP: 109/59  Pulse: 63  Temp: 36.5 C  Resp: 16    Level of consciousness: awake, alert  and patient cooperative  Complications: No apparent anesthesia complications

## 2015-06-08 NOTE — Interval H&P Note (Signed)
History and Physical Interval Note:  06/08/2015 9:23 AM  Erik Bush  has presented today for surgery, with the diagnosis of ANAL FISTULA  The various methods of treatment have been discussed with the patient and family. After consideration of risks, benefits and other options for treatment, the patient has consented to  Procedure(s): ANAL FISTULOTOMY (N/A) as a surgical intervention .  The patient's history has been reviewed, patient examined, no change in status, stable for surgery.  I have reviewed the patient's chart and labs.  Questions were answered to the patient's satisfaction.     SANKAR,SEEPLAPUTHUR G

## 2015-06-08 NOTE — Anesthesia Preprocedure Evaluation (Addendum)
Anesthesia Evaluation  Patient identified by MRN, date of birth, ID band Patient awake    Reviewed: Allergy & Precautions, NPO status , Patient's Chart, lab work & pertinent test results  Airway Mallampati: IV  TM Distance: >3 FB Neck ROM: Full    Dental  (+) Teeth Intact   Pulmonary sleep apnea , former smoker,  No using CPAP yet--not fitted for the mask and does not yet have settings.   Pulmonary exam normal        Cardiovascular negative cardio ROS Normal cardiovascular exam     Neuro/Psych    GI/Hepatic   Endo/Other  Hypothyroidism Treated.  Renal/GU      Musculoskeletal   Abdominal (+) + obese,  Abdomen: soft.    Peds  Hematology   Anesthesia Other Findings   Reproductive/Obstetrics                            Anesthesia Physical Anesthesia Plan  ASA: III  Anesthesia Plan: General   Post-op Pain Management:    Induction: Intravenous  Airway Management Planned: LMA  Additional Equipment:   Intra-op Plan:   Post-operative Plan: Extubation in OR  Informed Consent: I have reviewed the patients History and Physical, chart, labs and discussed the procedure including the risks, benefits and alternatives for the proposed anesthesia with the patient or authorized representative who has indicated his/her understanding and acceptance.     Plan Discussed with: CRNA  Anesthesia Plan Comments:         Anesthesia Quick Evaluation

## 2015-06-08 NOTE — Transfer of Care (Signed)
Immediate Anesthesia Transfer of Care Note  Patient: Erik Bush  Procedure(s) Performed: Procedure(s): ANAL FISTULOTOMY (N/A)  Patient Location: PACU  Anesthesia Type:General  Level of Consciousness: sedated and responds to stimulation  Airway & Oxygen Therapy: Patient Spontanous Breathing and Patient connected to face mask oxygen  Post-op Assessment: Report given to RN and Post -op Vital signs reviewed and stable  Post vital signs: Reviewed and stable  Last Vitals:  Filed Vitals:   06/08/15 1023  BP: 109/59  Pulse: 63  Temp: 36.5 C  Resp: 16    Complications: No apparent anesthesia complications

## 2015-06-08 NOTE — H&P (View-Only) (Signed)
[name], male   DOB: 1966-05-19, 49 y.o.   MRN: [id number]  Chief Complaint  Patient presents with  . Other    abscess on anal area    HPI Erik Bush is a 49 y.o. male here today for a evaluation  an anal abscess. He states she noticed this about three months ago. He states the area comes and goes.No drainage noticed, some bleeding. HPI  Past Medical History  Diagnosis Date  . Pneumonia   . Sleep apnea   . Hypothermia     Past Surgical History  Procedure Laterality Date  . Anterior cruciate ligament repair      knees  . Shoulder arthroscopy Right   . Medial collateral ligament repair, knee      Family History  Problem Relation Age of Onset  . Thyroid disease Father   . Heart attack      Social History Social History  Substance Use Topics  . Smoking status: Former Smoker -- 6 years    Quit date: 09/12/1989  . Smokeless tobacco: None  . Alcohol Use: 0.0 oz/week    0 Standard drinks or equivalent per week     Comment: occasionally    Allergies  Allergen Reactions  . Aspirin Itching and Swelling    Current Outpatient Prescriptions  Medication Sig Dispense Refill  . levothyroxine (SYNTHROID, LEVOTHROID) 125 MCG tablet Take 125 mcg by mouth daily before breakfast.     No current facility-administered medications for this visit.    Review of Systems Review of Systems  Constitutional: Negative.   Respiratory: Negative.   Cardiovascular: Negative.   Gastrointestinal: Negative.     Blood pressure 122/62, pulse 78, resp. rate 12, height 5\' 7"  (1.702 m), weight 167 lb (75.751 kg).  Physical Exam Physical Exam  Constitutional: He is oriented to person, place, and time. He appears well-developed and well-nourished.  Eyes: Conjunctivae are normal. No scleral icterus.  Neck: Neck supple.  Cardiovascular: Normal rate, regular rhythm and normal heart sounds.   Pulmonary/Chest: Effort normal and breath sounds normal.  Abdominal: Soft. Normal  appearance and bowel sounds are normal. There is no tenderness. A hernia ( 4-2VZ reducible umbilical hernia) is present.  Genitourinary:     Lymphadenopathy:    He has no cervical adenopathy.  Neurological: He is alert and oriented to person, place, and time.  Skin: Skin is warm and dry.    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      Plan    Surgical treatment discussed with pt and he is agreeable.   Patient's surgery has been scheduled for 06-08-15 at [facility].     PCP:  Kris Hartmann 06/03/2015, 1:13 PM

## 2015-06-09 NOTE — Telephone Encounter (Signed)
May return to work as long as he is comfortable.

## 2015-06-17 ENCOUNTER — Encounter: Payer: Self-pay | Admitting: General Surgery

## 2015-06-17 ENCOUNTER — Ambulatory Visit (INDEPENDENT_AMBULATORY_CARE_PROVIDER_SITE_OTHER): Payer: Commercial Indemnity | Admitting: General Surgery

## 2015-06-17 VITALS — BP 120/70 | HR 88 | Resp 12 | Ht 67.0 in | Wt 211.0 lb

## 2015-06-17 DIAGNOSIS — K603 Anal fistula: Secondary | ICD-10-CM

## 2015-06-17 NOTE — Patient Instructions (Signed)
The patient is aware to call back for any questions or concerns.  

## 2015-06-17 NOTE — Progress Notes (Signed)
This is a 49 year old male here today for his post op anal fistulotomy done on 06/08/15. He states he is doing well. He did have some bleeding after he went to the gym. Bowels are moving good.  Anal fistulotomy wound clean.  Follow up in one month. The patient is aware to call back for any questions or concerns.   PCP:  Casilda Carls

## 2015-06-18 ENCOUNTER — Encounter: Payer: Self-pay | Admitting: General Surgery

## 2015-06-23 ENCOUNTER — Ambulatory Visit: Payer: Commercial Indemnity | Admitting: General Surgery

## 2015-07-02 ENCOUNTER — Ambulatory Visit
Admission: RE | Admit: 2015-07-02 | Discharge: 2015-07-02 | Disposition: A | Discharge status: Home / Self Care | Payer: Managed Care, Other (non HMO) | Source: Ambulatory Visit | Attending: Internal Medicine | Admitting: Internal Medicine

## 2015-07-02 ENCOUNTER — Other Ambulatory Visit: Payer: Self-pay | Admitting: Internal Medicine

## 2015-07-02 DIAGNOSIS — M25562 Pain in left knee: Secondary | ICD-10-CM

## 2015-07-02 DIAGNOSIS — M1712 Unilateral primary osteoarthritis, left knee: Secondary | ICD-10-CM | POA: Diagnosis not present

## 2015-07-16 ENCOUNTER — Encounter: Payer: Self-pay | Admitting: General Surgery

## 2015-07-16 ENCOUNTER — Ambulatory Visit (INDEPENDENT_AMBULATORY_CARE_PROVIDER_SITE_OTHER): Payer: Commercial Indemnity | Admitting: General Surgery

## 2015-07-16 VITALS — BP 100/62 | HR 74 | Resp 12 | Ht 67.0 in | Wt 217.0 lb

## 2015-07-16 DIAGNOSIS — K603 Anal fistula: Secondary | ICD-10-CM

## 2015-07-16 NOTE — Patient Instructions (Signed)
Patient to return as needed. 

## 2015-07-16 NOTE — Progress Notes (Signed)
This is a 49 year old male here today for his post op anal fistulotomy done on 06/08/15. He states he is doing well. No rectal symptoms. I have reviewed the history of present illness with the patient.  Anal fistulotomy wound is completely healed. No new findings  Patient to follow up as needed.

## 2016-03-09 ENCOUNTER — Encounter: Payer: Self-pay | Admitting: Emergency Medicine

## 2016-03-09 ENCOUNTER — Emergency Department
Admission: EM | Admit: 2016-03-09 | Discharge: 2016-03-09 | Disposition: A | Discharge status: Home / Self Care | Payer: Managed Care, Other (non HMO) | Attending: Emergency Medicine | Admitting: Emergency Medicine

## 2016-03-09 ENCOUNTER — Emergency Department: Payer: Managed Care, Other (non HMO)

## 2016-03-09 DIAGNOSIS — S0232XA Fracture of orbital floor, left side, initial encounter for closed fracture: Secondary | ICD-10-CM | POA: Diagnosis not present

## 2016-03-09 DIAGNOSIS — E039 Hypothyroidism, unspecified: Secondary | ICD-10-CM | POA: Diagnosis not present

## 2016-03-09 DIAGNOSIS — S022XXA Fracture of nasal bones, initial encounter for closed fracture: Secondary | ICD-10-CM | POA: Insufficient documentation

## 2016-03-09 DIAGNOSIS — Y999 Unspecified external cause status: Secondary | ICD-10-CM | POA: Diagnosis not present

## 2016-03-09 DIAGNOSIS — S01112A Laceration without foreign body of left eyelid and periocular area, initial encounter: Secondary | ICD-10-CM

## 2016-03-09 DIAGNOSIS — Z79899 Other long term (current) drug therapy: Secondary | ICD-10-CM | POA: Diagnosis not present

## 2016-03-09 DIAGNOSIS — Y9389 Activity, other specified: Secondary | ICD-10-CM | POA: Diagnosis not present

## 2016-03-09 DIAGNOSIS — W501XXA Accidental kick by another person, initial encounter: Secondary | ICD-10-CM | POA: Diagnosis not present

## 2016-03-09 DIAGNOSIS — Y929 Unspecified place or not applicable: Secondary | ICD-10-CM | POA: Diagnosis not present

## 2016-03-09 DIAGNOSIS — Z87891 Personal history of nicotine dependence: Secondary | ICD-10-CM | POA: Insufficient documentation

## 2016-03-09 DIAGNOSIS — S0285XA Fracture of orbit, unspecified, initial encounter for closed fracture: Secondary | ICD-10-CM

## 2016-03-09 DIAGNOSIS — S0993XA Unspecified injury of face, initial encounter: Secondary | ICD-10-CM | POA: Diagnosis present

## 2016-03-09 MED ORDER — TETANUS-DIPHTH-ACELL PERTUSSIS 5-2.5-18.5 LF-MCG/0.5 IM SUSP
0.5000 mL | Freq: Once | INTRAMUSCULAR | Status: AC
  Administered 2016-03-09: 0.5 mL via INTRAMUSCULAR

## 2016-03-09 MED ORDER — TETANUS-DIPHTHERIA TOXOIDS TD 5-2 LFU IM INJ: 0.5000 mL | INJECTION | Freq: Once | INTRAMUSCULAR | Status: DC

## 2016-03-09 MED ORDER — CEPHALEXIN 500 MG PO CAPS
ORAL_CAPSULE | ORAL | Status: AC
  Administered 2016-03-09: 500 mg via ORAL
  Filled 2016-03-09: qty 1

## 2016-03-09 MED ORDER — TETANUS-DIPHTH-ACELL PERTUSSIS 5-2.5-18.5 LF-MCG/0.5 IM SUSP
INTRAMUSCULAR | Status: AC
  Filled 2016-03-09: qty 0.5

## 2016-03-09 MED ORDER — CEPHALEXIN 500 MG PO CAPS
500.0000 mg | ORAL_CAPSULE | Freq: Once | ORAL | Status: AC
  Administered 2016-03-09: 500 mg via ORAL
  Filled 2016-03-09: qty 1

## 2016-03-09 MED ORDER — OXYCODONE-ACETAMINOPHEN 5-325 MG PO TABS: 1.0000 | ORAL_TABLET | Freq: Four times a day (QID) | ORAL | Status: DC | PRN

## 2016-03-09 MED ORDER — CEPHALEXIN 500 MG PO CAPS: 500.0000 mg | ORAL_CAPSULE | Freq: Four times a day (QID) | ORAL | Status: DC

## 2016-03-09 NOTE — ED Notes (Signed)
Spoke with Dr Reita Cliche regarding pt---st have flex eval prior to performing CT scan performed
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
## 2016-03-16 ENCOUNTER — Encounter: Payer: Self-pay | Admitting: *Deleted

## 2016-03-21 NOTE — Discharge Instructions (Signed)

## 2016-03-22 ENCOUNTER — Ambulatory Visit: Payer: Managed Care, Other (non HMO) | Admitting: Anesthesiology

## 2016-03-22 ENCOUNTER — Encounter
Admission: RE | Disposition: A | Discharge status: Home / Self Care | Payer: Self-pay | Source: Ambulatory Visit | Attending: Otolaryngology

## 2016-03-22 ENCOUNTER — Encounter: Payer: Self-pay | Admitting: Emergency Medicine

## 2016-03-22 ENCOUNTER — Ambulatory Visit
Admission: RE | Admit: 2016-03-22 | Discharge: 2016-03-22 | Disposition: A | Discharge status: Home / Self Care | Payer: Managed Care, Other (non HMO) | Source: Ambulatory Visit | Attending: Otolaryngology | Admitting: Otolaryngology

## 2016-03-22 ENCOUNTER — Emergency Department: Payer: Managed Care, Other (non HMO)

## 2016-03-22 ENCOUNTER — Emergency Department
Admission: EM | Admit: 2016-03-22 | Discharge: 2016-03-22 | Disposition: A | Discharge status: Home / Self Care | Payer: Managed Care, Other (non HMO) | Source: Home / Self Care | Attending: Emergency Medicine | Admitting: Emergency Medicine

## 2016-03-22 DIAGNOSIS — Z809 Family history of malignant neoplasm, unspecified: Secondary | ICD-10-CM | POA: Diagnosis not present

## 2016-03-22 DIAGNOSIS — S022XXA Fracture of nasal bones, initial encounter for closed fracture: Secondary | ICD-10-CM | POA: Insufficient documentation

## 2016-03-22 DIAGNOSIS — Z79899 Other long term (current) drug therapy: Secondary | ICD-10-CM | POA: Insufficient documentation

## 2016-03-22 DIAGNOSIS — E039 Hypothyroidism, unspecified: Secondary | ICD-10-CM

## 2016-03-22 DIAGNOSIS — Z8249 Family history of ischemic heart disease and other diseases of the circulatory system: Secondary | ICD-10-CM | POA: Insufficient documentation

## 2016-03-22 DIAGNOSIS — G4730 Sleep apnea, unspecified: Secondary | ICD-10-CM | POA: Diagnosis not present

## 2016-03-22 DIAGNOSIS — R079 Chest pain, unspecified: Secondary | ICD-10-CM

## 2016-03-22 DIAGNOSIS — Z87891 Personal history of nicotine dependence: Secondary | ICD-10-CM | POA: Insufficient documentation

## 2016-03-22 DIAGNOSIS — X58XXXA Exposure to other specified factors, initial encounter: Secondary | ICD-10-CM | POA: Insufficient documentation

## 2016-03-22 DIAGNOSIS — Y9371 Activity, boxing: Secondary | ICD-10-CM | POA: Insufficient documentation

## 2016-03-22 DIAGNOSIS — J45901 Unspecified asthma with (acute) exacerbation: Secondary | ICD-10-CM

## 2016-03-22 DIAGNOSIS — Z8379 Family history of other diseases of the digestive system: Secondary | ICD-10-CM | POA: Diagnosis not present

## 2016-03-22 HISTORY — DX: Presence of spectacles and contact lenses: Z97.3

## 2016-03-22 HISTORY — DX: Unspecified asthma, uncomplicated: J45.909

## 2016-03-22 HISTORY — PX: CLOSED REDUCTION NASAL FRACTURE: SHX5365

## 2016-03-22 LAB — CBC
HEMATOCRIT: 43.5 % (ref 40.0–52.0)
HEMOGLOBIN: 15.2 g/dL (ref 13.0–18.0)
MCH: 29.8 pg (ref 26.0–34.0)
MCHC: 35.1 g/dL (ref 32.0–36.0)
MCV: 85.1 fL (ref 80.0–100.0)
Platelets: 194 10*3/uL (ref 150–440)
RBC: 5.11 MIL/uL (ref 4.40–5.90)
RDW: 13.7 % (ref 11.5–14.5)
WBC: 6.4 10*3/uL (ref 3.8–10.6)

## 2016-03-22 LAB — BASIC METABOLIC PANEL
ANION GAP: 10 (ref 5–15)
BUN: 13 mg/dL (ref 6–20)
CHLORIDE: 102 mmol/L (ref 101–111)
CO2: 23 mmol/L (ref 22–32)
Calcium: 9.7 mg/dL (ref 8.9–10.3)
Creatinine, Ser: 0.95 mg/dL (ref 0.61–1.24)
GFR calc Af Amer: >60 mL/min (ref >60)
GLUCOSE: 193 mg/dL — AB (ref 65–99)
POTASSIUM: 3.6 mmol/L (ref 3.5–5.1)
Sodium: 135 mmol/L (ref 135–145)

## 2016-03-22 LAB — TROPONIN I: Troponin I: <0.03 ng/mL (ref <0.03)

## 2016-03-22 SURGERY — CLOSED REDUCTION, FRACTURE, NASAL BONE
Anesthesia: General | Site: Nose | Wound class: Clean Contaminated

## 2016-03-22 MED ORDER — ALBUTEROL SULFATE HFA 108 (90 BASE) MCG/ACT IN AERS: 2.0000 | INHALATION_SPRAY | RESPIRATORY_TRACT | Status: DC | PRN

## 2016-03-22 MED ORDER — MEPERIDINE HCL 25 MG/ML IJ SOLN: 6.2500 mg | INTRAMUSCULAR | Status: DC | PRN

## 2016-03-22 MED ORDER — MORPHINE SULFATE (PF) 4 MG/ML IV SOLN
INTRAVENOUS | Status: AC
  Administered 2016-03-22: 4 mg via INTRAVENOUS
  Filled 2016-03-22: qty 1

## 2016-03-22 MED ORDER — SODIUM CHLORIDE 0.9 % IV BOLUS (SEPSIS)
1000.0000 mL | Freq: Once | INTRAVENOUS | Status: AC
  Administered 2016-03-22: 1000 mL via INTRAVENOUS

## 2016-03-22 MED ORDER — MORPHINE SULFATE (PF) 4 MG/ML IV SOLN
4.0000 mg | Freq: Once | INTRAVENOUS | Status: AC
  Administered 2016-03-22: 4 mg via INTRAVENOUS

## 2016-03-22 MED ORDER — GLYCOPYRROLATE 0.2 MG/ML IJ SOLN
INTRAMUSCULAR | Status: DC | PRN
  Administered 2016-03-22: .1 mg via INTRAVENOUS

## 2016-03-22 MED ORDER — FENTANYL CITRATE (PF) 100 MCG/2ML IJ SOLN
INTRAMUSCULAR | Status: DC | PRN
  Administered 2016-03-22: 100 ug via INTRAVENOUS

## 2016-03-22 MED ORDER — LIDOCAINE HCL (CARDIAC) 20 MG/ML IV SOLN
INTRAVENOUS | Status: DC | PRN
  Administered 2016-03-22: 50 mg via INTRATRACHEAL

## 2016-03-22 MED ORDER — IPRATROPIUM-ALBUTEROL 0.5-2.5 (3) MG/3ML IN SOLN
3.0000 mL | Freq: Once | RESPIRATORY_TRACT | Status: AC
  Administered 2016-03-22: 3 mL via RESPIRATORY_TRACT
  Filled 2016-03-22: qty 3

## 2016-03-22 MED ORDER — PROMETHAZINE HCL 25 MG/ML IJ SOLN: 6.2500 mg | INTRAMUSCULAR | Status: DC | PRN

## 2016-03-22 MED ORDER — DEXAMETHASONE SODIUM PHOSPHATE 4 MG/ML IJ SOLN
INTRAMUSCULAR | Status: DC | PRN
  Administered 2016-03-22: 10 mg via INTRAVENOUS

## 2016-03-22 MED ORDER — OXYCODONE HCL 5 MG PO TABS: 5.0000 mg | ORAL_TABLET | Freq: Once | ORAL | Status: AC | PRN

## 2016-03-22 MED ORDER — OXYMETAZOLINE HCL 0.05 % NA SOLN
NASAL | Status: DC | PRN
  Administered 2016-03-22: 1 via TOPICAL

## 2016-03-22 MED ORDER — CEFPROZIL 500 MG PO TABS: 500.0000 mg | ORAL_TABLET | Freq: Two times a day (BID) | ORAL | Status: DC

## 2016-03-22 MED ORDER — ONDANSETRON HCL 4 MG/2ML IJ SOLN
4.0000 mg | Freq: Once | INTRAMUSCULAR | Status: AC
  Administered 2016-03-22: 4 mg via INTRAVENOUS

## 2016-03-22 MED ORDER — HYDROCODONE-ACETAMINOPHEN 5-325 MG PO TABS: 1.0000 | ORAL_TABLET | ORAL | Status: DC | PRN

## 2016-03-22 MED ORDER — ONDANSETRON HCL 4 MG/2ML IJ SOLN
INTRAMUSCULAR | Status: AC
  Administered 2016-03-22: 4 mg via INTRAVENOUS
  Filled 2016-03-22: qty 2

## 2016-03-22 MED ORDER — ONDANSETRON HCL 4 MG/2ML IJ SOLN
INTRAMUSCULAR | Status: DC | PRN
  Administered 2016-03-22: 4 mg via INTRAVENOUS

## 2016-03-22 MED ORDER — HYDROMORPHONE HCL 1 MG/ML IJ SOLN
0.2500 mg | INTRAMUSCULAR | Status: DC | PRN
  Administered 2016-03-22: 0.4 mg via INTRAVENOUS
  Administered 2016-03-22 (×2): 0.3 mg via INTRAVENOUS

## 2016-03-22 MED ORDER — LACTATED RINGERS IV SOLN
INTRAVENOUS | Status: DC
  Administered 2016-03-22: 08:00:00 via INTRAVENOUS

## 2016-03-22 MED ORDER — MIDAZOLAM HCL 5 MG/5ML IJ SOLN
INTRAMUSCULAR | Status: DC | PRN
  Administered 2016-03-22: 2 mg via INTRAVENOUS

## 2016-03-22 MED ORDER — PROPOFOL 10 MG/ML IV BOLUS
INTRAVENOUS | Status: DC | PRN
  Administered 2016-03-22: 200 mg via INTRAVENOUS

## 2016-03-22 MED ORDER — ALBUTEROL SULFATE (2.5 MG/3ML) 0.083% IN NEBU
2.5000 mg | INHALATION_SOLUTION | Freq: Once | RESPIRATORY_TRACT | Status: AC
  Administered 2016-03-22: 2.5 mg via RESPIRATORY_TRACT
  Filled 2016-03-22: qty 3

## 2016-03-22 MED ORDER — OXYCODONE HCL 5 MG/5ML PO SOLN
5.0000 mg | Freq: Once | ORAL | Status: AC | PRN
  Administered 2016-03-22: 5 mg via ORAL

## 2016-03-22 SURGICAL SUPPLY — 21 items
ADHESIVE MASTISOL STRL (MISCELLANEOUS) ×3 IMPLANT
AQUAPLAST 3X3 FLAT (MISCELLANEOUS) ×3
BASIN GRAD PLASTIC 32OZ STRL (MISCELLANEOUS) ×3 IMPLANT
CANISTER SUCT 1200ML W/VALVE (MISCELLANEOUS) IMPLANT
CLOSURE WOUND 1/2 X4 (GAUZE/BANDAGES/DRESSINGS) ×1
COVER MAYO STAND STRL (DRAPES) IMPLANT
COVER TABLE BACK 60X90 (DRAPES) IMPLANT
CUP MEDICINE 2OZ PLAST GRAD ST (MISCELLANEOUS) ×3 IMPLANT
DRSG NASAL 4CM NASOPORE (MISCELLANEOUS) ×3 IMPLANT
GAUZE SPONGE 4X4 12PLY STRL (GAUZE/BANDAGES/DRESSINGS) ×3 IMPLANT
GLOVE BIO SURGEON STRL SZ7.5 (GLOVE) ×3 IMPLANT
KIT ROOM TURNOVER OR (KITS) ×3 IMPLANT
MARKER SKIN DUAL TIP RULER LAB (MISCELLANEOUS) ×3 IMPLANT
NEEDLE HYPO 25GX1X1/2 BEV (NEEDLE) ×3 IMPLANT
PATTIES SURGICAL .5 X3 (DISPOSABLE) IMPLANT
SPLINT AQUAPLAST 3X3 FLAT (MISCELLANEOUS) ×1 IMPLANT
STRIP CLOSURE SKIN 1/2X4 (GAUZE/BANDAGES/DRESSINGS) ×2 IMPLANT
SYRINGE 10CC LL (SYRINGE) ×3 IMPLANT
TOWEL OR 17X26 4PK STRL BLUE (TOWEL DISPOSABLE) ×3 IMPLANT
TUBING CONN 6MMX3.1M (TUBING) ×2
TUBING SUCTION CONN 0.25 STRL (TUBING) ×1 IMPLANT

## 2016-03-22 NOTE — ED Notes (Addendum)
Pt alert, talking and states CP 4/10. Color WNL at this time. Pt states that he is feeling better.

## 2016-03-22 NOTE — Addendum Note (Signed)
Addendum  created 03/22/16 1105 by Estill Batten, DO   Modules edited: Orders

## 2016-03-22 NOTE — ED Notes (Signed)
Patient transported to x-ray. ?

## 2016-03-22 NOTE — Anesthesia Preprocedure Evaluation (Signed)
Anesthesia Evaluation  Patient identified by MRN, date of birth, ID band Patient awake    Reviewed: Allergy & Precautions, NPO status , Patient's Chart, lab work & pertinent test results  Airway Mallampati: II  TM Distance: >3 FB Neck ROM: Full    Dental no notable dental hx.    Pulmonary sleep apnea , former smoker,    Pulmonary exam normal        Cardiovascular Normal cardiovascular exam     Neuro/Psych negative neurological ROS  negative psych ROS   GI/Hepatic negative GI ROS, Neg liver ROS,   Endo/Other  Hypothyroidism   Renal/GU negative Renal ROS     Musculoskeletal negative musculoskeletal ROS (+)   Abdominal   Peds  Hematology negative hematology ROS (+)   Anesthesia Other Findings   Reproductive/Obstetrics                             Anesthesia Physical Anesthesia Plan  ASA: II  Anesthesia Plan: General   Post-op Pain Management:    Induction: Intravenous  Airway Management Planned:   Additional Equipment:   Intra-op Plan:   Post-operative Plan:   Informed Consent: I have reviewed the patients History and Physical, chart, labs and discussed the procedure including the risks, benefits and alternatives for the proposed anesthesia with the patient or authorized representative who has indicated his/her understanding and acceptance.     Plan Discussed with: CRNA  Anesthesia Plan Comments:         Anesthesia Quick Evaluation

## 2016-03-22 NOTE — ED Notes (Signed)
Pt to ed with c/o chest pain that started today.  Pt had surgery on nose today and afterwards had EKG changes and bradycardia.  Pt also reports sob and difficulty breathing.

## 2016-03-22 NOTE — Transfer of Care (Signed)
Immediate Anesthesia Transfer of Care Note  Patient: Erik Bush  Procedure(s) Performed: Procedure(s) with comments: CLOSED REDUCTION NASAL FRACTURE (N/A) - sleep apnea  Patient Location: PACU  Anesthesia Type: General  Level of Consciousness: awake, alert  and patient cooperative  Airway and Oxygen Therapy: Patient Spontanous Breathing and Patient connected to supplemental oxygen  Post-op Assessment: Post-op Vital signs reviewed, Patient's Cardiovascular Status Stable, Respiratory Function Stable, Patent Airway and No signs of Nausea or vomiting  Post-op Vital Signs: Reviewed and stable  Complications: No apparent anesthesia complications

## 2016-03-22 NOTE — ED Notes (Signed)
Pt alert and oriented X4, active, cooperative, pt in NAD. RR even and unlabored, color WNL.  Pt informed to return if any life threatening symptoms occur.  Pt able to drink and ambulate prior to discharge.

## 2016-03-22 NOTE — Anesthesia Postprocedure Evaluation (Signed)
Anesthesia Post Note  Patient: Erik Bush  Procedure(s) Performed: Procedure(s) (LRB): CLOSED REDUCTION NASAL FRACTURE (N/A)  Patient location during evaluation: PACU Anesthesia Type: General Level of consciousness: awake and alert and oriented Pain management: pain level controlled Vital Signs Assessment: post-procedure vital signs reviewed and stable Respiratory status: spontaneous breathing and nonlabored ventilation Cardiovascular status: stable Postop Assessment: no signs of nausea or vomiting and adequate PO intake Anesthetic complications: no    Estill Batten

## 2016-03-22 NOTE — Discharge Instructions (Signed)
You were seen in the Emergency Department (ED) today and was diagnosed with an asthma exacerbation.   During an asthma attack, the airways swell and narrow. This makes it hard to breathe. Severe asthma attacks can be life-threatening, but you can help prevent them by keeping your asthma under control and treating symptoms before they get bad. This can also help you avoid future trips to the emergency room.   Follow-up with your doctor in 1 day for further evaluation.  Use albuterol 2 puffs every 4 hours as needed for shortness of breath, wheezing, or cough. Take the antibiotics prescribed to you by your surgeon.  When should you call for help?  Call 911 anytime you think you may need emergency care. For example, call if:  You have severe trouble breathing. Call your doctor now or seek immediate medical care if:  Your symptoms do not get better after you have followed your asthma action plan.  You have new or worse trouble breathing.  Your coughing and wheezing get worse.  You cough up dark brown or bloody mucus (sputum).  You have a new or higher fever. Watch closely for changes in your health, and be sure to contact your doctor if:  You need to use quick-relief medicine on more than 2 days a week (unless it is just for exercise).  You cough more deeply or more often, especially if you notice more mucus or a change in the color of your mucus.  You are not getting better as expected.  How can you care for yourself at home?  Follow your asthma action plan to prevent and treat attacks. If you don't have an asthma action plan, work with your doctor to create one.  Take your asthma medicines exactly as prescribed. Talk to your doctor right away if you have any questions about how to take them.  Use your quick-relief medicine when you have symptoms of an attack. Quick-relief medicine is usually an albuterol inhaler. Some people need to use quick-relief medicine before they exercise.  Take your  controller medicine every day, not just when you have symptoms. Controller medicine is usually an inhaled corticosteroid. The goal is to prevent problems before they occur. Don't use your controller medicine to treat an attack that has already started. It doesn't work fast enough to help.  If your doctor prescribed corticosteroid pills to use during an attack, take them exactly as prescribed. It may take hours for the pills to work, but they may make the episode shorter and help you breathe better.  Keep your quick-relief medicine with you at all times. Talk to your doctor before using other medicines. Some medicines, such as aspirin, can cause asthma attacks in some people.  Learn how to use a peak flow meter to check how well you are breathing. This can help you predict when an asthma attack is going to occur. Then you can take medicine to prevent the asthma attack or make it less severe.  Do not smoke or allow others to smoke around you. Avoid smoky places. Smoking makes asthma worse. If you need help quitting, talk to your doctor about stop-smoking programs and medicines. These can increase your chances of quitting for good.  Learn what triggers an asthma attack for you, and avoid the triggers when you can. Common triggers include colds, smoke, air pollution, dust, pollen, mold, pets, cockroaches, stress, and cold air.  Avoid colds and the flu. Get a pneumococcal vaccine shot. If you have had one before,  ask your doctor if you need a second dose. Get a flu vaccine every fall. If you must be around people with colds or the flu, wash your hands often.  To control asthma over the long term   Medicines  Controller medicines reduce swelling in your lungs. They also prevent asthma attacks. Take your controller medicine exactly as prescribed. Talk to your doctor if you have any problems with your medicine.  Inhaled corticosteroid is a common and effective controller medicine. Using it the right way can  prevent or reduce most side effects.  Take your controller medicine every day, not just when you have symptoms. It helps prevent problems before they occur.  Your doctor may prescribe another medicine that you use along with the corticosteroid. This is often a long-acting bronchodilator. Do not take this medicine by itself. Using a long-acting bronchodilator by itself can increase your risk of a severe or fatal asthma attack.  Do not take inhaled corticosteroids or long-acting bronchodilators to stop an asthma attack that has already started. They don't work fast enough to help.  Talk to your doctor before you use other medicines. Some medicines, such as aspirin, can cause asthma attacks in some people.  Education  Learn what triggers an asthma attack. Avoid these triggers when you can. Common triggers include colds, smoke, air pollution, dust, pollen, mold, pets, cockroaches, stress, and cold air.  If you have a peak flow meter, use it to check how well you are breathing. It can help you know when an asthma attack is going to occur. Then you can take medicine to prevent the asthma attack or make it less severe.  Do not smoke or allow others to smoke around you. Avoid smoky places. Smoking makes asthma worse. If you need help quitting, talk to your doctor about stop-smoking programs and medicines. These can increase your chances of quitting for good.  Avoid colds and the flu. Get a pneumococcal vaccine shot. If you have had one before, ask your doctor whether you need a second dose. Get a flu vaccine every year, as soon as it's available. If you must be around people with colds or the flu, wash your hands often.

## 2016-03-22 NOTE — Op Note (Signed)
03/22/2016 9:52 AM  Raford Pitcher RC:[id number]   Pre-Op Dx: NASAL FRACTURE  Post-op Dx: SAME  Procedure: Closed reduction of nasal fracture   Surgeon: Riley Nearing., MD  Anesthesia: General Endotracheal   EBL: Minimal   Complications: None   Findings: Nasal dorsum had a left sided depressed nasal bone fracture   Procedure: After the patient was identified in holding and the history and physical and consent was reviewed, the patient was taken to the operating room and placed in a supine position. General endotracheal anesthesia was induced in the normal fashion. The patient was draped with the eyes protected. The nose was decongested with Afrin moistened pledgets. After allowing time for decongestion, these were removed.   A Boies elevator was then placed intranasally, and used to manipulate the nasal bone fragments until the nasal dorsum appeared to be midline with no palpable step-off deformity. The left nasal bone had very little support, so Nasopore absorbable packing was placed in the left nasal cavity to support the loose bone fragment in the appropriate position.  Following this, the skin was prepped with Mastisol, and 1/2 Ster-strips applied to the nose. Next an Aquaplast splint was fashioned to fit the nasal dorsum, and placed for protection of the nasal bones during healing. This was further secured with Steri-strips. The care of patient was returned to anesthesia, awakened, and transferred to recovery in stable condition.   Disposition: PACU to home   Plan: Regular diet. Ice pack to nose as needed for pain and swelling. Keep nasal splint in place until follow-up. Limit exercise and strenuous activity for the next week. Recheck my office once week.   Riley Nearing., MD  9:52 AM 03/22/2016

## 2016-03-22 NOTE — ED Notes (Signed)
MD at bedside. 

## 2016-03-22 NOTE — Progress Notes (Signed)
Occasional irregular beats noted at approx 1045  when HR in the 40s, which appeared to be junctional escape beats. 12 lead EKG ordered & reviewed by Dr. Tommi Rumps, which revealed sinus bradycardia. Otherwise pt's VSS have been stable, pt asymptomatic, MD verbalizes pt is OK for discharge.

## 2016-03-22 NOTE — ED Provider Notes (Signed)
[facility] Emergency Department Provider Note  ____________________________________________  Time seen: Approximately 4:30 PM  I have reviewed the triage vital signs and the nursing notes.   HISTORY  Chief Complaint Chest Pain   HPI Erik Bush is a 50 y.o. male with a history of hypothyroidism, remote h/o asthma, and POD 0 from rhinoplasty who presents for evaluation of chest pain. Patient had his procedure earlier this morning. According to his wife the procedure lasted about 15 minutes. Patient was intubated with an LMA. During recovery patient had one episode of bradycardia but was asymptomatic. He was discharged home. At 1:30 PM he took a dose of Vicodin for pain. An hour later patient woke up and started complaining of left-sided chest pain. He currently endorses severe pain, located in left side of his chest, non radiating, pleuritic in nature, associated with shortness of breath. No diaphoresis, no nausea, no vomiting. He reports a very small amount of bleeding from his nose. Patient denies any personal history of ischemic heart disease. Has a brother with a history of ischemic heart disease. Patient is not a smoker. Denies any personal or family history blood clots, any recent travel or immobilization, exogenous hormones, leg pain or swelling. Denies fever or coughing.  Past Medical History  Diagnosis Date  . Hypothermia   . Pneumonia 2016  . Hypothyroidism   . Sleep apnea     NO CPAP  . Wears contact lenses   . Asthma     There are no active problems to display for this patient.   Past Surgical History  Procedure Laterality Date  . Anterior cruciate ligament repair      knees  . Shoulder arthroscopy Right   . Medial collateral ligament repair, knee    . Anal fistulotomy N/A 06/08/2015    Procedure: ANAL FISTULOTOMY;  Surgeon: Christene Lye, MD;  Location: [facility] ORS;  Service: General;  Laterality: N/A;    Current Outpatient Rx    Name  Route  Sig  Dispense  Refill  . cefPROZIL (CEFZIL) 500 MG tablet   Oral   Take 1 tablet (500 mg total) by mouth 2 (two) times daily.   20 tablet   0   . HYDROcodone-acetaminophen (NORCO/VICODIN) 5-325 MG tablet   Oral   Take 1-2 tablets by mouth every 4 (four) hours as needed for moderate pain.   30 tablet   0   . levothyroxine (SYNTHROID, LEVOTHROID) 125 MCG tablet   Oral   Take 125 mcg by mouth daily before breakfast.         . albuterol (PROVENTIL HFA;VENTOLIN HFA) 108 (90 Base) MCG/ACT inhaler   Inhalation   Inhale 2 puffs into the lungs every 4 (four) hours as needed for wheezing or shortness of breath.   1 Inhaler   2     Allergies Aspirin; Brussels sprouts; and Other  Family History  Problem Relation Age of Onset  . Thyroid disease Father   . Heart attack      Social History Social History  Substance Use Topics  . Smoking status: Former Smoker -- 6 years    Quit date: 09/12/1989  . Smokeless tobacco: None  . Alcohol Use: 3.6 oz/week    6 Cans of beer, 0 Standard drinks or equivalent per week     Comment:      Review of Systems Constitutional: Negative for fever. Eyes: Negative for visual changes. ENT: Negative for sore throat. Cardiovascular:+ chest pain. Respiratory: + shortness of  breath. Gastrointestinal: Negative for abdominal pain, vomiting or diarrhea. Genitourinary: Negative for dysuria. Musculoskeletal: Negative for back pain. Skin: Negative for rash. Neurological: Negative for headaches, weakness or numbness.  ____________________________________________   PHYSICAL EXAM:  VITAL SIGNS: ED Triage Vitals  Enc Vitals Group     BP 03/22/16 1607 119/77 mmHg     Pulse Rate 03/22/16 1607 70     Resp 03/22/16 1607 26     Temp 03/22/16 1607 98 F (36.7 C)     Temp Source 03/22/16 1607 Oral     SpO2 03/22/16 1607 100 %     Weight 03/22/16 1607 190 lb (86.183 kg)     Height 03/22/16 1607 5\' 7"  (1.702 m)     Head Cir --      Peak  Flow --      Pain Score 03/22/16 1605 6     Pain Loc --      Pain Edu? --      Excl. in [place]? --     Constitutional: Alert and oriented. Well appearing and in no apparent distress. HEENT:      Head: Normocephalic and atraumatic.         Eyes: Conjunctivae are normal. Sclera is non-icteric. EOMI. PERRL      Mouth/Throat: Mucous membranes are moist.       Neck: Supple with no signs of meningismus. Cardiovascular: Regular rate and rhythm. No murmurs, gallops, or rubs. 2+ symmetrical distal pulses are present in all extremities. No JVD. Respiratory: Normal respiratory effort. Lungs are clear to auscultation bilaterally. No wheezes, crackles, or rhonchi. Decrease air movement. Gastrointestinal: Soft, non tender, and non distended with positive bowel sounds. No rebound or guarding. Musculoskeletal: Nontender with normal range of motion in all extremities. No edema, cyanosis, or erythema of extremities. Neurologic: Normal speech and language. Face is symmetric. Moving all extremities. No gross focal neurologic deficits are appreciated. Skin: Skin is warm, dry and intact. No rash noted. Psychiatric: Mood and affect are normal. Speech and behavior are normal.  ____________________________________________   LABS (all labs ordered are listed, but only abnormal results are displayed)  Labs Reviewed  BASIC METABOLIC PANEL - Abnormal; Notable for the following:    Glucose, Bld 193 (*)    All other components within normal limits  CBC  TROPONIN I   ____________________________________________  EKG  ED ECG REPORT I, Rudene Re, the attending physician, personally viewed and interpreted this ECG.  Normal sinus rhythm, rate of 69, first-degree AV block, normal QRS and QTc intervals, normal axis, no ST elevations or depressions, T-wave flattening on lead 3. Unchanged from prior ____________________________________________  RADIOLOGY  CXR:  Atelectasis ____________________________________________   PROCEDURES  Procedure(s) performed: None Critical Care performed:  None ____________________________________________   INITIAL IMPRESSION / ASSESSMENT AND PLAN / ED COURSE  50 y.o. male with a history of hypothyroidism, remote h/o asthma, and POD 0 from rhinoplasty who presents for evaluation of left sided chest pain, pleuritic, associated with shortness of breath. Patient is postop today however was a very short surgery lasted 15 minutes, no airway manipulation. I believe this should not predispose patient to blood clots. He has no other risk factors for blood clots. Heart score of 1. Possibly aspiration causing pneumonitis and asthma flair. Plan for telemetry, labs, CXR. Will treat pain with IV morphine, IV zofran, and normal saline. Will give duoneb treatment.   ----------------------------------------- 5:53 PM on 03/22/2016 -----------------------------------------  Patient reports marked improvement of his shortness of breath and chest pain after  one DuoNeb treatment. Still not moving great air. We'll give a second breathing treatment.   ----------------------------------------- 6:30 PM on 03/22/2016 -----------------------------------------  Patient reports resolution of chest pain and shortness of breath after 2 breathing treatments. We'll discharge him home with an albuterol inhaler, 2 puffs every 4 hours for shortness of breath or wheezing. Patient is on antibiotics postop which cover aspiration pneumonia so encourage patient to fill his prescription today and start taking it. Discussed return precautions with patient.   Pertinent labs & imaging results that were available during my care of the patient were reviewed by me and considered in my medical decision making (see chart for details).   I discussed my evaluation of the patient's symptoms, my clinical impression, and my proposed outpatient treatment plan with  patient/ family members. We have discussed anticipatory guidance, scheduled follow-up, and careful return precautions. The patient expresses understanding and is comfortable with the discharge plan. All patient's questions were answered.    ____________________________________________   FINAL CLINICAL IMPRESSION(S) / ED DIAGNOSES  Final diagnoses:  Chest pain, unspecified chest pain type  Asthma exacerbation      NEW MEDICATIONS STARTED DURING THIS VISIT:  New Prescriptions   ALBUTEROL (PROVENTIL HFA;VENTOLIN HFA) 108 (90 BASE) MCG/ACT INHALER    Inhale 2 puffs into the lungs every 4 (four) hours as needed for wheezing or shortness of breath.     Note:  This document was prepared using Dragon voice recognition software and may include unintentional dictation errors.    Rudene Re, MD 03/22/16 [phone]

## 2016-03-22 NOTE — H&P (Signed)
History and physical reviewed and will be scanned in later. No change in medical status reported by the patient or family, appears stable for surgery. All questions regarding the procedure answered, and patient (or family if a child) expressed understanding of the procedure.  Amer Alcindor S @TODAY@ 

## 2016-03-22 NOTE — Anesthesia Procedure Notes (Signed)
Procedure Name: LMA Insertion Date/Time: 03/22/2016 9:33 AM Performed by: Mayme Genta Pre-anesthesia Checklist: Patient identified, Emergency Drugs available, Suction available, Timeout performed and Patient being monitored Patient Re-evaluated:Patient Re-evaluated prior to inductionOxygen Delivery Method: Circle system utilized Preoxygenation: Pre-oxygenation with 100% oxygen Intubation Type: IV induction LMA: LMA inserted LMA Size: 4.0 Number of attempts: 1 Placement Confirmation: positive ETCO2 and breath sounds checked- equal and bilateral Tube secured with: Tape

## 2016-03-22 NOTE — ED Notes (Addendum)
Pt pale, color WNL. Pt did take 1 Vicodin 2.5 hours PTA.

## 2016-03-23 ENCOUNTER — Encounter: Payer: Self-pay | Admitting: Otolaryngology

## 2017-01-11 ENCOUNTER — Other Ambulatory Visit: Payer: Self-pay | Admitting: Neurosurgery

## 2017-01-11 DIAGNOSIS — M5412 Radiculopathy, cervical region: Secondary | ICD-10-CM

## 2017-01-25 ENCOUNTER — Ambulatory Visit: Payer: 59

## 2017-02-13 ENCOUNTER — Ambulatory Visit
Admission: RE | Admit: 2017-02-13 | Discharge: 2017-02-13 | Disposition: A | Discharge status: Home / Self Care | Payer: 59 | Source: Ambulatory Visit | Attending: Neurosurgery | Admitting: Neurosurgery

## 2017-02-13 DIAGNOSIS — M50221 Other cervical disc displacement at C4-C5 level: Secondary | ICD-10-CM | POA: Diagnosis not present

## 2017-02-13 DIAGNOSIS — M4802 Spinal stenosis, cervical region: Secondary | ICD-10-CM | POA: Insufficient documentation

## 2017-02-13 DIAGNOSIS — M5412 Radiculopathy, cervical region: Secondary | ICD-10-CM

## 2017-02-13 DIAGNOSIS — D170 Benign lipomatous neoplasm of skin and subcutaneous tissue of head, face and neck: Secondary | ICD-10-CM | POA: Insufficient documentation

## 2017-02-13 DIAGNOSIS — M50322 Other cervical disc degeneration at C5-C6 level: Secondary | ICD-10-CM | POA: Diagnosis not present

## 2017-02-13 DIAGNOSIS — M2578 Osteophyte, vertebrae: Secondary | ICD-10-CM | POA: Diagnosis not present

## 2017-02-27 ENCOUNTER — Ambulatory Visit (INDEPENDENT_AMBULATORY_CARE_PROVIDER_SITE_OTHER): Payer: 59 | Admitting: Surgery

## 2017-02-27 ENCOUNTER — Encounter: Payer: Self-pay | Admitting: Surgery

## 2017-02-27 VITALS — BP 128/76 | HR 69 | Temp 98.0°F | Ht 67.0 in | Wt 217.2 lb

## 2017-02-27 DIAGNOSIS — D170 Benign lipomatous neoplasm of skin and subcutaneous tissue of head, face and neck: Secondary | ICD-10-CM | POA: Diagnosis not present

## 2017-02-27 NOTE — Patient Instructions (Signed)
We have scheduled your Lipoma Removal for 04/05/17 with Dr. Dahlia Byes.  Please see your blue sheet for further instructions.

## 2017-02-27 NOTE — Progress Notes (Signed)
Surgical Consultation  02/27/2017  Erik Bush is an 51 y.o. male.   Chief Complaint  Patient presents with  . New Patient (Initial Visit)    Lipoma on right side of neck-referred by Dr. Rosario Jacks Office     HPI: Erik Bush Seen in consultation for a lipoma referred by Dr. Rosario Jacks. He reports that he has had this soft tissue mass for several years for last few months it has increased in size and now experiences some intermittent pain. Patient reports that he is experiences some numbness to the right arm and the pain goes from the neck to the arm. Pain  is intermittent and sharp in nature and is moderate. No nspecific  alleviating or aggravating factors. The workup has included an MRI that I have personally reviewed. There is a large lipoma on the nuchal area on the right side no evidence of sarcomatous component. No previous surgeries in that area. hE is able to perform more than 4 METS w/o SOB or c/p  Past Medical History:  Diagnosis Date  . Asthma   . Hypothermia   . Hypothyroidism   . Pneumonia 2016  . Sleep apnea    NO CPAP  . Wears contact lenses     Past Surgical History:  Procedure Laterality Date  . ANAL FISTULOTOMY N/A 06/08/2015   Procedure: ANAL FISTULOTOMY;  Surgeon: Christene Lye, MD;  Location: [facility] ORS;  Service: General;  Laterality: N/A;  . ANTERIOR CRUCIATE LIGAMENT REPAIR     knees  . CLOSED REDUCTION NASAL FRACTURE N/A 03/22/2016   Procedure: CLOSED REDUCTION NASAL FRACTURE;  Surgeon: Clyde Canterbury, MD;  Location: [place];  Service: ENT;  Laterality: N/A;  sleep apnea  . MEDIAL COLLATERAL LIGAMENT REPAIR, KNEE    . SHOULDER ARTHROSCOPY Right     Family History  Problem Relation Age of Onset  . Thyroid disease Father   . Heart attack Unknown     Social History:  reports that he quit smoking about 27 years ago. He quit after 6.00 years of use. He has never used smokeless tobacco. He reports that he drinks alcohol. He reports that he does  not use drugs.  Allergies:  Allergies  Allergen Reactions  . Aspirin Itching and Swelling  . Brussels Sprouts [Brassica Oleracea Italica] Swelling  . Other Swelling    OLIVES-SWELLING    Medications reviewed.    ROS Full ROS performed and is otherwise negative other than what is stated in the HPI   BP 128/76   Pulse 69   Temp 98 F (36.7 C) (Oral)   Ht 5\' 7"  (1.702 m)   Wt 98.5 kg (217 lb 3.2 oz)   BMI 34.02 kg/m    Physical Exam  Constitutional: He is oriented to person, place, and time and well-developed, well-nourished, and in no distress. No distress.  Eyes: Pupils are equal, round, and reactive to light. No scleral icterus.  Neck: Normal range of motion. Neck supple. No JVD present. No tracheal deviation present. No thyromegaly present.  Cardiovascular: Normal rate, regular rhythm and normal heart sounds.   No murmur heard. Pulmonary/Chest: Effort normal. No respiratory distress. He has no wheezes. He has no rales.  Abdominal: Soft. He exhibits no distension. There is no tenderness. There is no rebound and no guarding.  Neurological: He is alert and oriented to person, place, and time. Gait normal. GCS score is 15.  Skin: Skin is warm and dry.  7 cms x 5 cms right sided nuchal soft tissue  mass, non tender c/w lipoma  Psychiatric: Mood, memory, affect and judgment normal.  Nursing note and vitals reviewed.   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 Discussed with the patient in detail about the procedure. I do think that given its size we'll have to do it under general anesthetic and in a prone position. We'll likely place and JP drain given the size of the lesion. Scars with the patient in detail about the procedure, risks benefits and possible complications including but not limited to: Bleeding, seroma, infection, recurrence. He understands and wishes to proceed. Discussed in detail about the neurological symptoms  may not be related to the lipoma but more likely related to his spine pathology. He understands this.    Caroleen Hamman, MD [facility] General Surgeon

## 2017-03-01 ENCOUNTER — Telehealth: Payer: Self-pay | Admitting: Surgery

## 2017-03-01 NOTE — Telephone Encounter (Signed)
Pt advised of pre op date/time and sx date. Sx: 04/05/17 with Dr Dorathy Daft removal of the nuchal.  Pre op: 03/29/17 between 9-1:00pm--phone.   Patient made aware to call [phone], between 1-3:00pm the day before surgery, to find out what time to arrive.

## 2017-03-29 ENCOUNTER — Encounter
Admission: RE | Admit: 2017-03-29 | Discharge: 2017-03-29 | Disposition: A | Discharge status: Home / Self Care | Payer: 59 | Source: Ambulatory Visit | Attending: Surgery | Admitting: Surgery

## 2017-03-29 DIAGNOSIS — Z01812 Encounter for preprocedural laboratory examination: Secondary | ICD-10-CM | POA: Insufficient documentation

## 2017-03-29 DIAGNOSIS — D170 Benign lipomatous neoplasm of skin and subcutaneous tissue of head, face and neck: Secondary | ICD-10-CM | POA: Insufficient documentation

## 2017-03-29 NOTE — Patient Instructions (Signed)
  Your procedure is scheduled on: 04-05-17 [facility] Report to Same Day Surgery 2nd floor medical mall [facility] Entrance-take elevator on left to 2nd floor.  Check in with surgery information desk.) To find out your arrival time please call [phone] between 1PM - 3PM on 04-04-17 TUESDAY  Remember: Instructions that are not followed completely may result in serious medical risk, up to and including death, or upon the discretion of your surgeon and anesthesiologist your surgery may need to be rescheduled.    _x___ 1. Do not eat food or drink liquids after midnight. No gum chewing or hard candies.     __x__ 2. No Alcohol for 24 hours before or after surgery.   __x__3. No Smoking for 24 prior to surgery.   ____  4. Bring all medications with you on the day of surgery if instructed.    __x__ 5. Notify your doctor if there is any change in your medical condition     (cold, fever, infections).     Do not wear jewelry, make-up, hairpins, clips or nail polish.  Do not wear lotions, powders, or perfumes. You may wear deodorant.  Do not shave 48 hours prior to surgery. Men may shave face and neck.  Do not bring valuables to the hospital.    [facility] is not responsible for any belongings or valuables.               Contacts, dentures or bridgework may not be worn into surgery.  Leave your suitcase in the car. After surgery it may be brought to your room.  For patients admitted to the hospital, discharge time is determined by your treatment team.   Patients discharged the day of surgery will not be allowed to drive home.  You will need someone to drive you home and stay with you the night of your procedure.    Please read over the following fact sheets that you were given:     _x___ [place] WITH A SMALL SIP OF WATER. These include:  1. LEVOTHYROXINE  2.  3.  4.  5.  6.  ____Fleets enema or Magnesium Citrate as directed.   _x___ Use  CHG Soap or sage wipes as directed on instruction sheet   ____ Use inhalers on the day of surgery and bring to hospital day of surgery  ____ Stop Metformin and Janumet 2 days prior to surgery.    ____ Take 1/2 of usual insulin dose the night before surgery and none on the morning  surgery.   ____ Follow recommendations from Cardiologist, Pulmonologist or PCP regarding stopping Aspirin, Coumadin, Pllavix ,Eliquis, Effient, or Pradaxa, and Pletal.  X____Stop Anti-inflammatories such as Advil, Aleve, Ibuprofen, Motrin, Naproxen, Naprosyn, Goodies powders or aspirin products NOW-OK to take Tylenol    ____ Stop supplements until after surgery.     ____ Bring C-Pap to the hospital.

## 2017-03-30 ENCOUNTER — Encounter
Admission: RE | Admit: 2017-03-30 | Discharge: 2017-03-30 | Disposition: A | Discharge status: Home / Self Care | Payer: 59 | Source: Ambulatory Visit | Attending: Surgery | Admitting: Surgery

## 2017-03-30 DIAGNOSIS — Z01812 Encounter for preprocedural laboratory examination: Secondary | ICD-10-CM | POA: Diagnosis present

## 2017-03-30 DIAGNOSIS — D170 Benign lipomatous neoplasm of skin and subcutaneous tissue of head, face and neck: Secondary | ICD-10-CM | POA: Diagnosis not present

## 2017-03-30 LAB — BASIC METABOLIC PANEL
ANION GAP: 7 (ref 5–15)
BUN: 15 mg/dL (ref 6–20)
CHLORIDE: 104 mmol/L (ref 101–111)
CO2: 27 mmol/L (ref 22–32)
Calcium: 9.3 mg/dL (ref 8.9–10.3)
Creatinine, Ser: 0.92 mg/dL (ref 0.61–1.24)
GFR calc Af Amer: >60 mL/min (ref >60)
GFR calc non Af Amer: >60 mL/min (ref >60)
GLUCOSE: 100 mg/dL — AB (ref 65–99)
POTASSIUM: 4.1 mmol/L (ref 3.5–5.1)
Sodium: 138 mmol/L (ref 135–145)

## 2017-04-04 MED ORDER — CEFAZOLIN SODIUM-DEXTROSE 2-4 GM/100ML-% IV SOLN
2.0000 g | INTRAVENOUS | Status: AC
  Administered 2017-04-05: 2 g via INTRAVENOUS

## 2017-04-05 ENCOUNTER — Ambulatory Visit
Admission: RE | Admit: 2017-04-05 | Discharge: 2017-04-05 | Disposition: A | Discharge status: Home / Self Care | Payer: 59 | Source: Ambulatory Visit | Attending: Surgery | Admitting: Surgery

## 2017-04-05 ENCOUNTER — Encounter: Payer: Self-pay | Admitting: *Deleted

## 2017-04-05 ENCOUNTER — Encounter
Admission: RE | Disposition: A | Discharge status: Home / Self Care | Payer: Self-pay | Source: Ambulatory Visit | Attending: Surgery

## 2017-04-05 ENCOUNTER — Ambulatory Visit: Payer: 59 | Admitting: Anesthesiology

## 2017-04-05 DIAGNOSIS — D170 Benign lipomatous neoplasm of skin and subcutaneous tissue of head, face and neck: Secondary | ICD-10-CM | POA: Diagnosis present

## 2017-04-05 DIAGNOSIS — E039 Hypothyroidism, unspecified: Secondary | ICD-10-CM | POA: Insufficient documentation

## 2017-04-05 DIAGNOSIS — G4730 Sleep apnea, unspecified: Secondary | ICD-10-CM | POA: Insufficient documentation

## 2017-04-05 DIAGNOSIS — J45909 Unspecified asthma, uncomplicated: Secondary | ICD-10-CM | POA: Diagnosis not present

## 2017-04-05 DIAGNOSIS — Z87891 Personal history of nicotine dependence: Secondary | ICD-10-CM | POA: Insufficient documentation

## 2017-04-05 DIAGNOSIS — R221 Localized swelling, mass and lump, neck: Secondary | ICD-10-CM | POA: Diagnosis not present

## 2017-04-05 DIAGNOSIS — Z79899 Other long term (current) drug therapy: Secondary | ICD-10-CM | POA: Diagnosis not present

## 2017-04-05 HISTORY — PX: LIPOMA EXCISION: SHX5283

## 2017-04-05 SURGERY — EXCISION LIPOMA
Anesthesia: General | Wound class: Clean

## 2017-04-05 MED ORDER — HYDROCODONE-ACETAMINOPHEN 5-325 MG PO TABS
1.0000 | ORAL_TABLET | Freq: Four times a day (QID) | ORAL | Status: AC | PRN
  Administered 2017-04-05 (×2): 1 via ORAL

## 2017-04-05 MED ORDER — HYDROCODONE-ACETAMINOPHEN 5-325 MG PO TABS
ORAL_TABLET | ORAL | Status: AC
  Administered 2017-04-05 (×2): 1 via ORAL
  Filled 2017-04-05: qty 1

## 2017-04-05 MED ORDER — CHLORHEXIDINE GLUCONATE CLOTH 2 % EX PADS: 6.0000 | MEDICATED_PAD | Freq: Once | CUTANEOUS | Status: DC

## 2017-04-05 MED ORDER — BUPIVACAINE HCL (PF) 0.25 % IJ SOLN
INTRAMUSCULAR | Status: AC
  Filled 2017-04-05: qty 30

## 2017-04-05 MED ORDER — FENTANYL CITRATE (PF) 100 MCG/2ML IJ SOLN: 25.0000 ug | INTRAMUSCULAR | Status: DC | PRN

## 2017-04-05 MED ORDER — KETOROLAC TROMETHAMINE 30 MG/ML IJ SOLN
INTRAMUSCULAR | Status: DC | PRN
  Administered 2017-04-05: 30 mg via INTRAVENOUS

## 2017-04-05 MED ORDER — LACTATED RINGERS IV SOLN
INTRAVENOUS | Status: DC
  Administered 2017-04-05: 10:00:00 via INTRAVENOUS

## 2017-04-05 MED ORDER — LACTATED RINGERS IV SOLN
INTRAVENOUS | Status: DC | PRN
  Administered 2017-04-05: 10:00:00 via INTRAVENOUS

## 2017-04-05 MED ORDER — FAMOTIDINE 20 MG PO TABS
20.0000 mg | ORAL_TABLET | Freq: Once | ORAL | Status: AC
  Administered 2017-04-05: 20 mg via ORAL

## 2017-04-05 MED ORDER — PROPOFOL 10 MG/ML IV BOLUS
INTRAVENOUS | Status: DC | PRN
  Administered 2017-04-05: 160 mg via INTRAVENOUS

## 2017-04-05 MED ORDER — MIDAZOLAM HCL 2 MG/2ML IJ SOLN
INTRAMUSCULAR | Status: DC | PRN
  Administered 2017-04-05: 2 mg via INTRAVENOUS

## 2017-04-05 MED ORDER — MIDAZOLAM HCL 2 MG/2ML IJ SOLN
INTRAMUSCULAR | Status: AC
  Filled 2017-04-05: qty 2

## 2017-04-05 MED ORDER — FENTANYL CITRATE (PF) 100 MCG/2ML IJ SOLN
INTRAMUSCULAR | Status: DC | PRN
  Administered 2017-04-05: 100 ug via INTRAVENOUS

## 2017-04-05 MED ORDER — PROPOFOL 10 MG/ML IV BOLUS
INTRAVENOUS | Status: AC
  Filled 2017-04-05: qty 20

## 2017-04-05 MED ORDER — HYDROCODONE-ACETAMINOPHEN 5-325 MG PO TABS
1.0000 | ORAL_TABLET | Freq: Four times a day (QID) | ORAL | Status: DC | PRN
  Administered 2017-04-05: 1 via ORAL

## 2017-04-05 MED ORDER — ROCURONIUM BROMIDE 100 MG/10ML IV SOLN
INTRAVENOUS | Status: DC | PRN
  Administered 2017-04-05: 40 mg via INTRAVENOUS
  Administered 2017-04-05: 10 mg via INTRAVENOUS

## 2017-04-05 MED ORDER — ONDANSETRON HCL 4 MG/2ML IJ SOLN
4.0000 mg | Freq: Once | INTRAMUSCULAR | Status: DC | PRN
Start: 2017-04-05 — End: 2017-04-05

## 2017-04-05 MED ORDER — SUGAMMADEX SODIUM 200 MG/2ML IV SOLN
INTRAVENOUS | Status: DC | PRN
  Administered 2017-04-05: 190.6 mg via INTRAVENOUS

## 2017-04-05 MED ORDER — ONDANSETRON HCL 4 MG/2ML IJ SOLN
INTRAMUSCULAR | Status: DC | PRN
  Administered 2017-04-05: 4 mg via INTRAVENOUS

## 2017-04-05 MED ORDER — HYDROCODONE-ACETAMINOPHEN 5-325 MG PO TABS
ORAL_TABLET | ORAL | Status: AC
  Filled 2017-04-05: qty 1

## 2017-04-05 MED ORDER — BUPIVACAINE HCL (PF) 0.5 % IJ SOLN
INTRAMUSCULAR | Status: AC
  Filled 2017-04-05: qty 30

## 2017-04-05 MED ORDER — HYDROCODONE-ACETAMINOPHEN 5-325 MG PO TABS: 1.0000 | ORAL_TABLET | Freq: Four times a day (QID) | ORAL | 0 refills | Status: DC | PRN

## 2017-04-05 MED ORDER — FAMOTIDINE 20 MG PO TABS
ORAL_TABLET | ORAL | Status: AC
  Administered 2017-04-05: 20 mg via ORAL
  Filled 2017-04-05: qty 1

## 2017-04-05 MED ORDER — FENTANYL CITRATE (PF) 100 MCG/2ML IJ SOLN
INTRAMUSCULAR | Status: AC
  Filled 2017-04-05: qty 2

## 2017-04-05 MED ORDER — LIDOCAINE HCL (CARDIAC) 20 MG/ML IV SOLN
INTRAVENOUS | Status: DC | PRN
  Administered 2017-04-05: 50 mg via INTRAVENOUS

## 2017-04-05 MED ORDER — CEFAZOLIN SODIUM-DEXTROSE 2-4 GM/100ML-% IV SOLN
INTRAVENOUS | Status: AC
  Filled 2017-04-05: qty 100

## 2017-04-05 SURGICAL SUPPLY — 20 items
BLADE CLIPPER SURG (BLADE) ×3 IMPLANT
DERMABOND ADVANCED (GAUZE/BANDAGES/DRESSINGS) ×2
DERMABOND ADVANCED .7 DNX12 (GAUZE/BANDAGES/DRESSINGS) ×1 IMPLANT
DRSG TELFA 4X3 1S NADH ST (GAUZE/BANDAGES/DRESSINGS) ×3 IMPLANT
ELECT REM PT RETURN 9FT ADLT (ELECTROSURGICAL) ×3
ELECTRODE REM PT RTRN 9FT ADLT (ELECTROSURGICAL) ×1 IMPLANT
GAUZE SPONGE 4X4 12PLY STRL (GAUZE/BANDAGES/DRESSINGS) ×3 IMPLANT
GLOVE BIO SURGEON STRL SZ7 (GLOVE) ×6 IMPLANT
GLOVE BIOGEL PI IND STRL 7.5 (GLOVE) ×1 IMPLANT
GLOVE BIOGEL PI INDICATOR 7.5 (GLOVE) ×2
GOWN STRL REUS W/ TWL LRG LVL3 (GOWN DISPOSABLE) ×2 IMPLANT
GOWN STRL REUS W/TWL LRG LVL3 (GOWN DISPOSABLE) ×4
NDL SAFETY 25GX1.5 (NEEDLE) ×3 IMPLANT
PACK BASIN MINOR ARMC (MISCELLANEOUS) ×3 IMPLANT
SPONGE KITTNER 5P (MISCELLANEOUS) ×3 IMPLANT
SUT VIC AB 3-0 SH 27 (SUTURE) ×4
SUT VIC AB 3-0 SH 27X BRD (SUTURE) ×2 IMPLANT
SYR 20CC LL (SYRINGE) ×3 IMPLANT
TOWEL OR 17X26 4PK STRL BLUE (TOWEL DISPOSABLE) ×3 IMPLANT
WATER STERILE IRR 1000ML POUR (IV SOLUTION) ×3 IMPLANT

## 2017-04-05 NOTE — H&P (Signed)
Chief Complaint  Patient presents with  . New Patient (Initial Visit)    Lipoma on right side of neck-referred by Dr. Rosario Jacks Office     HPI: Erik Bush Seen in consultation for a lipoma referred by Dr. Rosario Jacks. He reports that he has had this soft tissue mass for several years for last few months it has increased in size and now experiences some intermittent pain. Patient reports that he is experiences some numbness to the right arm and the pain goes from the neck to the arm. Pain  is intermittent and sharp in nature and is moderate. No nspecific  alleviating or aggravating factors. The workup has included an MRI that I have personally reviewed. There is a large lipoma on the nuchal area on the right side no evidence of sarcomatous component. No previous surgeries in that area. hE is able to perform more than 4 METS w/o SOB or c/p      Past Medical History:  Diagnosis Date  . Asthma   . Hypothermia   . Hypothyroidism   . Pneumonia 2016  . Sleep apnea    NO CPAP  . Wears contact lenses          Past Surgical History:  Procedure Laterality Date  . ANAL FISTULOTOMY N/A 06/08/2015   Procedure: ANAL FISTULOTOMY;  Surgeon: Christene Lye, MD;  Location: [facility] ORS;  Service: General;  Laterality: N/A;  . ANTERIOR CRUCIATE LIGAMENT REPAIR     knees  . CLOSED REDUCTION NASAL FRACTURE N/A 03/22/2016   Procedure: CLOSED REDUCTION NASAL FRACTURE;  Surgeon: Clyde Canterbury, MD;  Location: [place];  Service: ENT;  Laterality: N/A;  sleep apnea  . MEDIAL COLLATERAL LIGAMENT REPAIR, KNEE    . SHOULDER ARTHROSCOPY Right          Family History  Problem Relation Age of Onset  . Thyroid disease Father   . Heart attack Unknown     Social History:  reports that he quit smoking about 27 years ago. He quit after 6.00 years of use. He has never used smokeless tobacco. He reports that he drinks alcohol. He reports that he does not use drugs.  Allergies:    Allergies  Allergen Reactions  . Aspirin Itching and Swelling  . Brussels Sprouts [Brassica Oleracea Italica] Swelling  . Other Swelling    OLIVES-SWELLING    Medications reviewed.    ROS Full ROS performed and is otherwise negative other than what is stated in the HPI   Physical Exam  Constitutional: He is oriented to person, place, and time and well-developed, well-nourished, and in no distress. No distress.  Eyes: Pupils are equal, round, and reactive to light. No scleral icterus.  Neck: Normal range of motion. Neck supple. No JVD present. No tracheal deviation present. No thyromegaly present.  Cardiovascular: Normal rate, regular rhythm and normal heart sounds.   No murmur heard. Pulmonary/Chest: Effort normal. No respiratory distress. He has no wheezes. He has no rales.  Abdominal: Soft. He exhibits no distension. There is no tenderness. There is no rebound and no guarding.  Neurological: He is alert and oriented to person, place, and time. Gait normal. GCS score is 15.  Skin: Skin is warm and dry.  7 cms x 5 cms right sided nuchal soft tissue mass, non tender c/w lipoma  Psychiatric: Mood, memory, affect and judgment normal.  Nursing note and vitals reviewed.   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 Discussed with the  patient in detail about the procedure. I do think that given its size we'll have to do it under general anesthetic and in a prone position. We'll likely place and JP drain given the size of the lesion. Scars with the patient in detail about the procedure, risks benefits and possible complications including but not limited to: Bleeding, seroma, infection, recurrence. He understands and wishes to proceed. Discussed in detail about the neurological symptoms may not be related to the lipoma but more likely related to his spine pathology. He understands this. All questions answered    Caroleen Hamman, MD [place] Surgeon

## 2017-04-05 NOTE — Transfer of Care (Signed)
Immediate Anesthesia Transfer of Care Note  Patient: Erik Bush  Procedure(s) Performed: Procedure(s) with comments: EXCISION LIPOMA Nuchal area.  prone position (N/A) - Prone  Patient Location: PACU  Anesthesia Type:General  Level of Consciousness: sedated  Airway & Oxygen Therapy: Patient Spontanous Breathing and Patient connected to face mask oxygen  Post-op Assessment: Report given to RN and Post -op Vital signs reviewed and stable  Post vital signs: Reviewed and stable  Last Vitals:  Vitals:   04/05/17 0908  BP: 109/71  Pulse: (!) 58  Resp: 20  Temp: 36.8 C    Last Pain:  Vitals:   04/05/17 0908  TempSrc: Oral  PainSc: 8       Patients Stated Pain Goal: 0 (16/10/96 0454)  Complications: No apparent anesthesia complications

## 2017-04-05 NOTE — OR Nursing (Signed)
Prescription for home 1-2 tabs 5 mg hydrocodone-325 mg acetaminophen every 6 hours as needed for moderate pan.

## 2017-04-05 NOTE — Anesthesia Procedure Notes (Signed)
Procedure Name: Intubation Date/Time: 04/05/2017 10:34 AM Performed by: XENMMHW, Kent Braunschweig Pre-anesthesia Checklist: Patient identified, Emergency Drugs available, Suction available, Patient being monitored and Timeout performed Patient Re-evaluated:Patient Re-evaluated prior to induction Oxygen Delivery Method: Circle system utilized Preoxygenation: Pre-oxygenation with 100% oxygen Induction Type: IV induction Laryngoscope Size: Mac and 4 Grade View: Grade I Tube type: Oral Tube size: 7.5 mm Number of attempts: 1 Placement Confirmation: ETT inserted through vocal cords under direct vision,  breath sounds checked- equal and bilateral,  CO2 detector and positive ETCO2 Secured at: 22 cm Tube secured with: Tape

## 2017-04-05 NOTE — Anesthesia Post-op Follow-up Note (Cosign Needed)
Anesthesia QCDR form completed.        

## 2017-04-05 NOTE — Op Note (Addendum)
  04/05/2017  11:29 AM  PATIENT:  Erik Bush  51 y.o. male  PRE-OPERATIVE DIAGNOSIS: Right posterior Neck soft tissue mass  POST-OPERATIVE DIAGNOSIS:  Same  PROCEDURE:  Excision of large deep subcutaneous mass Right posterior neck area  SURGEON:  Surgeon(s) and Role:    * Pabon, Diego F, MD - Primary  EBL: 5cc  ANESTHESIA: GETA   DICTATION:  Patient was playing about proceeding detail, risk benefits possible complications and a consent was obtained. The patient taken to the operating room and placed in the prone position.   He was prepped and draped in the usual sterile fashion using a 10 blade knife , cautery used to divide the subq tissue and the lipoma was noted to be in the deep subcutaneous cutaneous tissue. The lipoma was underneath the fascia but on top of the muscle. The fascia was incised and the lipoma delivered and excised from the subcutaneous tissues and muscle using electrocautery. We were very careful not to be in the vicinity of the spinal accessory nerve and we stayed on the medial aspect of the trapezius muscle at all times. Hemostasis was obtained with electrocautery. The fascia was closed with interrupted 2-0 Vicryls and the subcutaneous tissue approximated with 3-0 Vicryl in a running fashion. Care was closed with the Monocryl 4-0 in a subcuticular manner Dermabond applied. Marcaine quarter percent was injected around the wound site. Needle and laparotomy counts were correct and there were no immediate complications  Jules Husbands, MD

## 2017-04-05 NOTE — Anesthesia Preprocedure Evaluation (Signed)
Anesthesia Evaluation  Patient identified by MRN, date of birth, ID band Patient awake    Reviewed: Allergy & Precautions, NPO status , Patient's Chart, lab work & pertinent test results, reviewed documented beta blocker date and time   Airway Mallampati: III  TM Distance: >3 FB     Dental  (+) Chipped   Pulmonary sleep apnea and Continuous Positive Airway Pressure Ventilation , pneumonia, former smoker,           Cardiovascular      Neuro/Psych    GI/Hepatic   Endo/Other  Hypothyroidism   Renal/GU      Musculoskeletal   Abdominal   Peds  Hematology   Anesthesia Other Findings   Reproductive/Obstetrics                             Anesthesia Physical Anesthesia Plan  ASA: III  Anesthesia Plan: General   Post-op Pain Management:    Induction: Intravenous  PONV Risk Score and Plan:   Airway Management Planned: Oral ETT  Additional Equipment:   Intra-op Plan:   Post-operative Plan:   Informed Consent: I have reviewed the patients History and Physical, chart, labs and discussed the procedure including the risks, benefits and alternatives for the proposed anesthesia with the patient or authorized representative who has indicated his/her understanding and acceptance.     Plan Discussed with: CRNA  Anesthesia Plan Comments:         Anesthesia Quick Evaluation

## 2017-04-05 NOTE — Anesthesia Postprocedure Evaluation (Signed)
Anesthesia Post Note  Patient: Erik Bush  Procedure(Bush) Performed: Procedure(Bush) (LRB): EXCISION LIPOMA Nuchal area.  prone position (N/A)  Patient location during evaluation: PACU Anesthesia Type: General Level of consciousness: awake and alert Pain management: pain level controlled Vital Signs Assessment: post-procedure vital signs reviewed and stable Respiratory status: spontaneous breathing, nonlabored ventilation, respiratory function stable and patient connected to nasal cannula oxygen Cardiovascular status: blood pressure returned to baseline and stable Postop Assessment: no signs of nausea or vomiting Anesthetic complications: no     Last Vitals:  Vitals:   04/05/17 1230 04/05/17 1240  BP: 114/81 116/67  Pulse: 70 70  Resp: 18 18  Temp:  (!) 36 C    Last Pain:  Vitals:   04/05/17 1240  TempSrc: Temporal  PainSc:                  Erik Bush

## 2017-04-06 ENCOUNTER — Telehealth: Payer: Self-pay

## 2017-04-06 LAB — SURGICAL PATHOLOGY

## 2017-04-06 NOTE — Telephone Encounter (Signed)
Post-op call made to patient at this time. Spoke with [employer]. Post-op interview questions below.  1. How are you feeling? Still having some soreness  2. Is your pain controlled? Yes  3. What are you doing for the pain? Taking pain medication as needed  4. Are you having any Nausea or Vomiting? Some nausea  5. Are you having any Fever or Chills? None  6. Are you having any Constipation or Diarrhea? Constipation  7. Is there any Swelling or Bruising you are concerned about?   8. Do you have any questions or concerns at this time? Wants to know if he can have a et a doctors note for work with his restrictions faxed to his job? I told him that I would call and get a fax number and have it sent over for him as well as have a copy of the work note at the front desk. Patient verbalized understanding and stated that he would pick up the work note tomorrow.    Discussion: Reminded patient of his appointment on 8/15 with Dr. Dahlia Byes at 2:15PM     Call made to [place] to obtain a fax number [phone]) to send patients' work note.

## 2017-04-06 NOTE — Telephone Encounter (Signed)
Post-op call made to patient at this time. Left message for patient to call back with any questions or concerns. Also reminded him of his appointment with Dr. Dahlia Byes on 8/15 at 2:15PM.

## 2017-04-13 ENCOUNTER — Encounter: Payer: Self-pay | Admitting: Surgery

## 2017-04-13 ENCOUNTER — Ambulatory Visit (INDEPENDENT_AMBULATORY_CARE_PROVIDER_SITE_OTHER): Payer: 59 | Admitting: Surgery

## 2017-04-13 VITALS — BP 104/68 | HR 74 | Temp 98.4°F | Wt 215.0 lb

## 2017-04-13 DIAGNOSIS — D170 Benign lipomatous neoplasm of skin and subcutaneous tissue of head, face and neck: Secondary | ICD-10-CM

## 2017-04-13 NOTE — Progress Notes (Signed)
Outpatient postop visit  04/13/2017  Erik Bush is an 51 y.o. male.    Procedure: Excision of neck mass  OZ:YYQM wound  HPI: Patient status post excision of a posterior neck mass. He called today stating that his wound had opened up and was asked to be seen. He also describes shortness of breath and dizziness 2 or 3 days ago he has not had any since. He drove his himself today. He is experienced no shortness of breath today.  Medications reviewed.    Physical Exam:  BP 104/68   Pulse (!) 166   Temp 98.4 F (36.9 C) (Oral)   Wt 215 lb (97.5 kg)   BMI 33.67 kg/m     PE: Patient's pulse was measured on the monitor 166 with a blood pressure of 104/68. I had the assistant repeat that and it was 97 pulse. By my exam manually I took his pulse to be in the low 70s. It is not in atrial fibrillation was very regular rate and rhythm.  Patient does not appear to be in any sort of distress and appears quite comfortable.  The chest was clear to auscultation.  Neck wound is completely closed there is no erythema no drainage no tenderness Dermabond is in place no sign of even the smallest of openings.    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 Patient asked to be seen today because his fiance thought the wound that opened up. At this point I see absolutely no evidence of the wound is open in fact it looks perfect. The patient does not appear to have any cardiac issues at this time he is not in atrial fibrillation and his heart rate measured on the monitor was 166 and then repeated at 97 but at the same time it was reading 97 on the machine my exam showed low 70s. I see no need for cardiac workup or chest x-ray at this time. He will follow-up with Dr. Dahlia Byes in 10 days.  Florene Glen, MD, FACS

## 2017-04-13 NOTE — Patient Instructions (Signed)
Lipoma Removal, Care After  Refer to this sheet in the next few weeks. These instructions provide you with information about caring for yourself after your procedure. Your health care provider may also give you more specific instructions. Your treatment has been planned according to current medical practices, but problems sometimes occur. Call your health care provider if you have any problems or questions after your procedure.  What can I expect after the procedure?  After the procedure, it is common to have:  · Mild pain.  · Swelling.  · Bruising.    Follow these instructions at home:    Bathing  · Do not take baths, swim, or use a hot tub until your health care provider approves. Ask your health care provider if you can take showers. You may only be allowed to take sponge baths for bathing.  · Keep your bandage (dressing) dry until your health care provider says it can be removed.  Incision care    · Follow instructions from your health care provider about how to take care of your incision. Make sure you:  ? Wash your hands with soap and water before you change your bandage (dressing). If soap and water are not available, use hand sanitizer.  ? Change your dressing as told by your health care provider.  ? Leave stitches (sutures), skin glue, or adhesive strips in place. These skin closures may need to stay in place for 2 weeks or longer. If adhesive strip edges start to loosen and curl up, you may trim the loose edges. Do not remove adhesive strips completely unless your health care provider tells you to do that.  · Check your incision area every day for signs of infection. Check for:  ? More redness, swelling, or pain.  ? Fluid or blood.  ? Warmth.  ? Pus or a bad smell.  Driving  · Do not drive or operate heavy machinery while taking prescription pain medicine.  · Do not drive for 24 hours if you received a medicine to help you relax (sedative) during your procedure.  · Ask your health care provider when it is  safe for you to drive.  General instructions  · Take over-the-counter and prescription medicines only as told by your health care provider.  · Do not use any tobacco products, such as cigarettes, chewing tobacco, and e-cigarettes. These can delay healing. If you need help quitting, ask your health care provider.  · Return to your normal activities as told by your health care provider. Ask your health care provider what activities are safe for you.  · Keep all follow-up visits as told by your health care provider. This is important.  Contact a health care provider if:  · You have more redness, swelling, or pain around your incision.  · You have fluid or blood coming from your incision.  · Your incision feels warm to the touch.  · You have pus or a bad smell coming from your incision.  · You have pain that does not get better with medicine.  Get help right away if:  · You have chills or a fever.  · You have severe pain.  This information is not intended to replace advice given to you by your health care provider. Make sure you discuss any questions you have with your health care provider.  Document Released: 11/12/2015 Document Revised: 02/09/2016 Document Reviewed: 11/12/2015  Elsevier Interactive Patient Education © 2018 [employer]

## 2017-04-26 ENCOUNTER — Ambulatory Visit (INDEPENDENT_AMBULATORY_CARE_PROVIDER_SITE_OTHER): Payer: 59 | Admitting: Surgery

## 2017-04-26 ENCOUNTER — Encounter: Payer: Self-pay | Admitting: Surgery

## 2017-04-26 VITALS — BP 122/76 | HR 87 | Temp 98.3°F | Ht 67.0 in | Wt 215.4 lb

## 2017-04-26 DIAGNOSIS — Z09 Encounter for follow-up examination after completed treatment for conditions other than malignant neoplasm: Secondary | ICD-10-CM

## 2017-04-26 NOTE — Progress Notes (Signed)
S/p  Excision nuchal lipoma Doing well Some hardness around incision  PE NAD Incisions healing well, no infection, seroma or complications  A/P Reassure pt about benign findings He is doing well w/o complications Path d/w pt RTC prn

## 2017-04-26 NOTE — Patient Instructions (Signed)
Continue to ice the area for stiffness that you maybe having. The incision are will heal over time and start to become smoother within the next 6 months.  If you have any further questions or concerns please give our office a call.

## 2017-07-12 IMAGING — CR DG CHEST 2V
1 series · 2 of 2 positions shown · non-contrast
Comparison: None.

CLINICAL DATA: Chest pain

EXAM:
CHEST  2 VIEW

[Series 1: dg chest 2 view · 0.14mm/px · 2 of 2 slices shown]
[im 1/2]
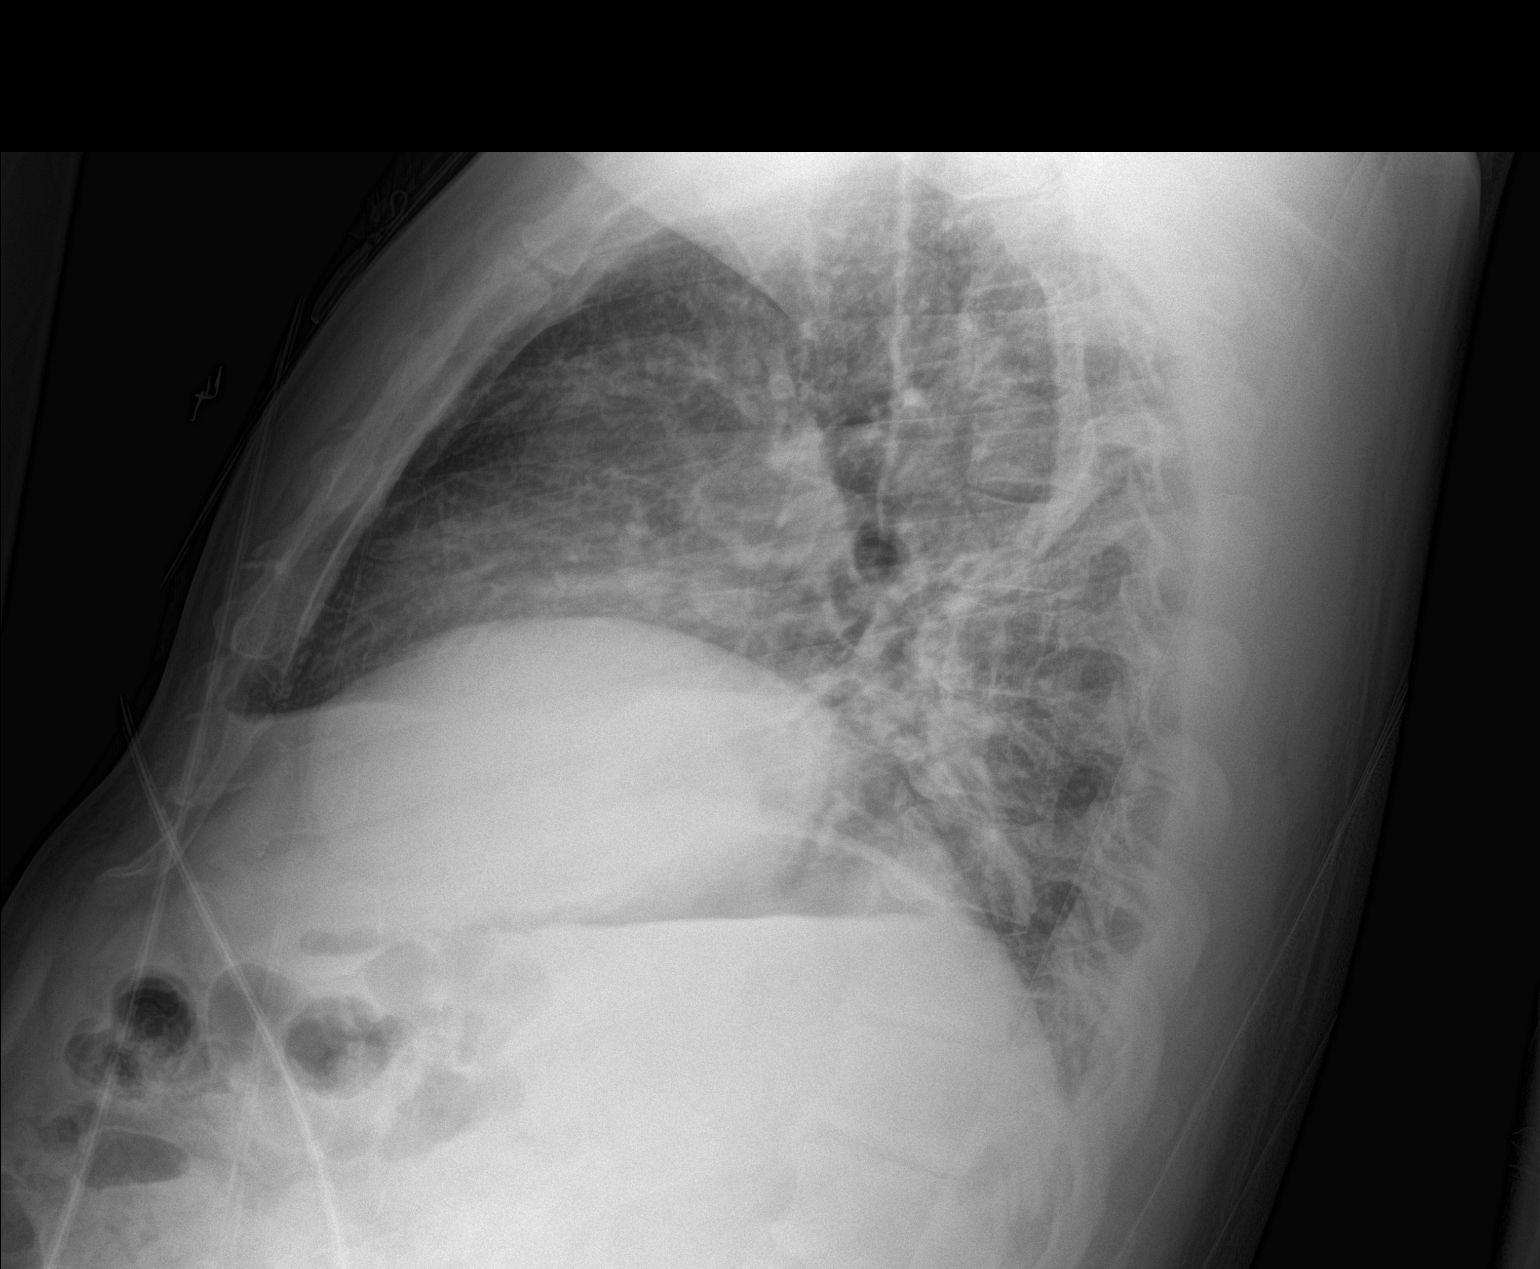
[im 2/2]
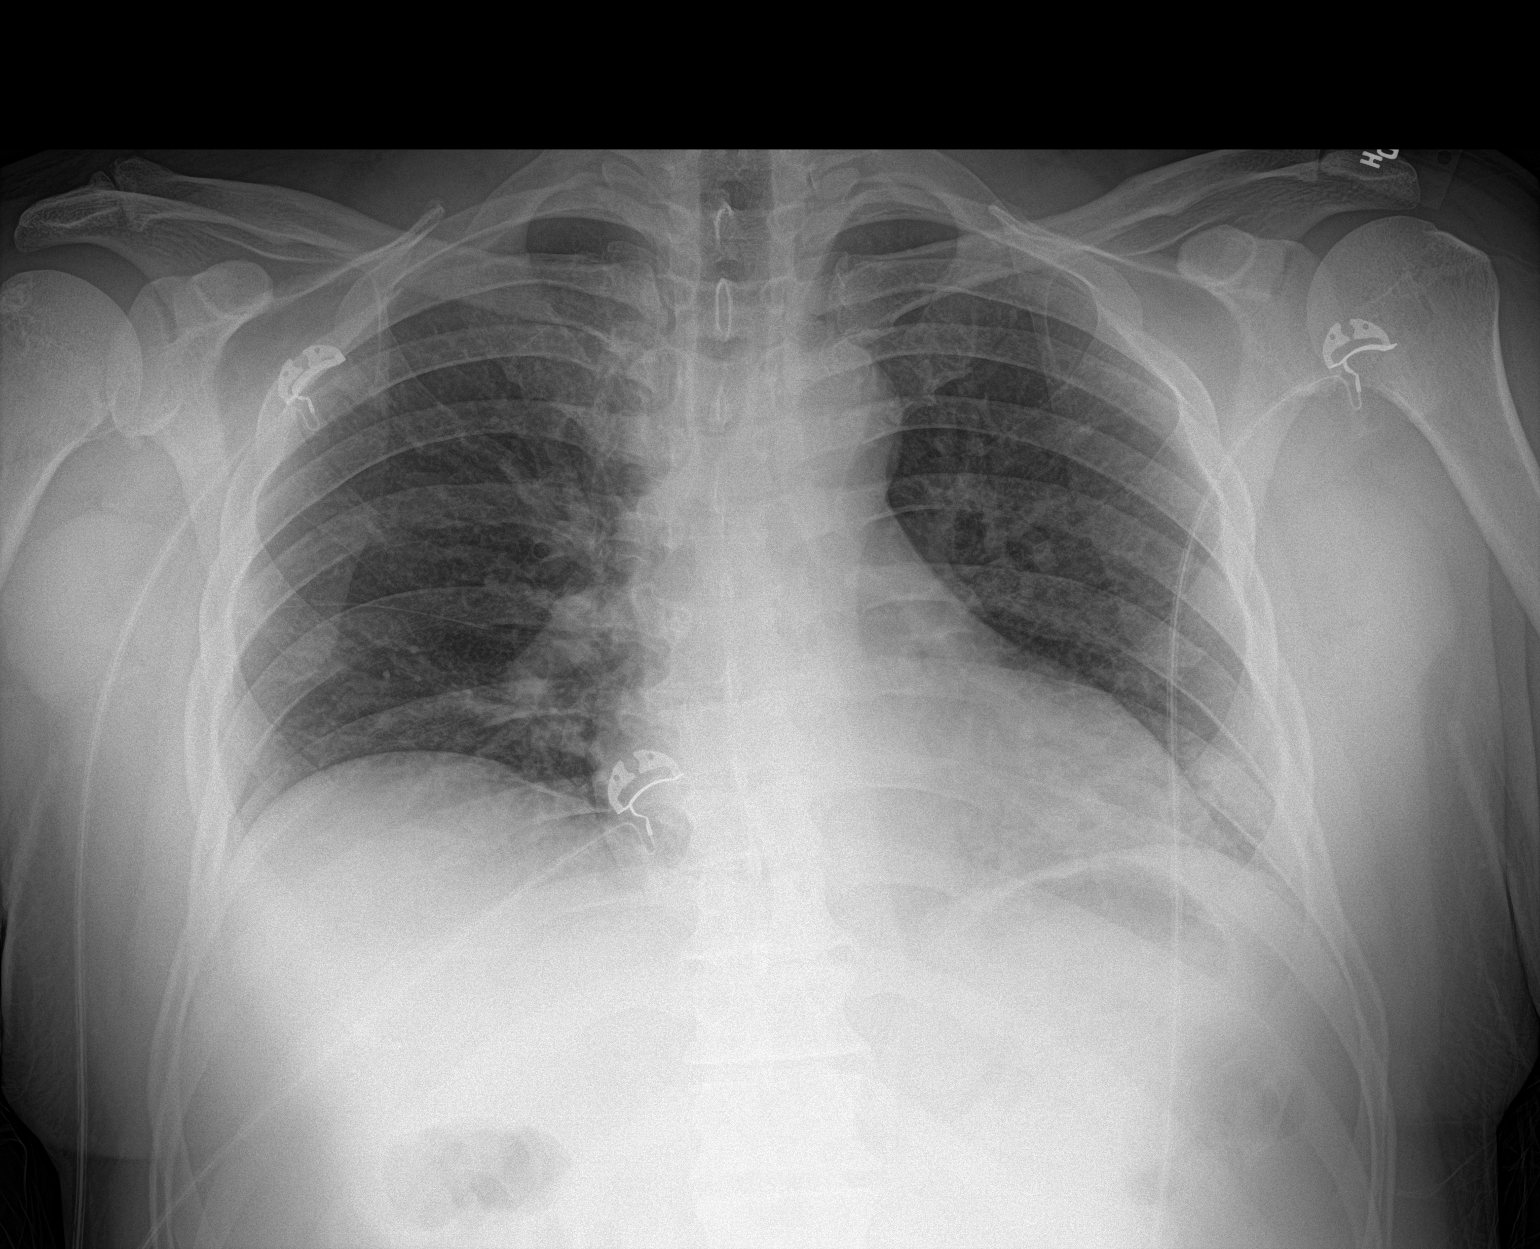

[2 of 2 positions shown; findings below may reference images not displayed]

FINDINGS: The heart size and mediastinal contours are within normal limits.
The overall inspiratory effort is poor. Minimal left basilar
atelectasis is noted. . The visualized skeletal structures are
unremarkable.
IMPRESSION: Minimal left basilar atelectasis.

## 2017-07-22 ENCOUNTER — Ambulatory Visit
Admission: EM | Admit: 2017-07-22 | Discharge: 2017-07-22 | Disposition: A | Discharge status: Home / Self Care | Payer: 59 | Attending: Family Medicine | Admitting: Family Medicine

## 2017-07-22 ENCOUNTER — Other Ambulatory Visit: Payer: Self-pay

## 2017-07-22 ENCOUNTER — Ambulatory Visit (INDEPENDENT_AMBULATORY_CARE_PROVIDER_SITE_OTHER): Payer: 59

## 2017-07-22 DIAGNOSIS — R5383 Other fatigue: Secondary | ICD-10-CM

## 2017-07-22 DIAGNOSIS — J988 Other specified respiratory disorders: Secondary | ICD-10-CM

## 2017-07-22 DIAGNOSIS — R0602 Shortness of breath: Secondary | ICD-10-CM

## 2017-07-22 DIAGNOSIS — R05 Cough: Secondary | ICD-10-CM

## 2017-07-22 MED ORDER — AMOXICILLIN-POT CLAVULANATE 875-125 MG PO TABS: 1.0000 | ORAL_TABLET | Freq: Two times a day (BID) | ORAL | 0 refills | Status: DC

## 2017-07-22 NOTE — Discharge Instructions (Signed)
Antibiotic as prescribed.  Take care  Dr. Sohil Timko  

## 2017-07-22 NOTE — ED Triage Notes (Signed)
Patient complains of cough, congestion, and fever x 1 week. Patient states that symptoms improved in the middle of the week and then worsened again. Patient states that he has a history of pneumonia.

## 2017-07-22 NOTE — ED Provider Notes (Signed)
Erik Bush    CSN: [id number] Arrival date & time: 07/22/17  1006     History   Chief Complaint Chief Complaint  Patient presents with  . Fever   HPI 51 year old male presents with numerous complaints.  Patient states that he has not been feeling well.  He was sick last week with a respiratory infection and got better with over-the-counter treatment.  His symptoms recurred yesterday.  He states that he feels extremely unwell.  He reports body aches, sore throat, congestion, shortness of breath, fatigue.  Patient states that he is just not feeling well.  Patient is concerned he has pneumonia as he has had this in the past.  No medications or interventions tried.  No known exacerbating or relieving factors.  No other complaints or concerns at this time.   Past Medical History:  Diagnosis Date  . Asthma   . Hypothermia   . Hypothyroidism   . Pneumonia 2016  . Sleep apnea    USES CPAP  . Wears contact lenses     Patient Active Problem List   Diagnosis Date Noted  . Neck mass     Past Surgical History:  Procedure Laterality Date  . ANTERIOR CRUCIATE LIGAMENT REPAIR     knees  . MEDIAL COLLATERAL LIGAMENT REPAIR, KNEE    . SHOULDER ARTHROSCOPY Right      Home Medications    Prior to Admission medications   Medication Sig Start Date End Date Taking? Authorizing Provider  levothyroxine (SYNTHROID, LEVOTHROID) 125 MCG tablet Take 125 mcg by mouth daily before breakfast.   Yes [provider]  Multiple Vitamins-Minerals (MULTIVITAMIN WITH MINERALS) tablet Take 1 tablet by mouth daily.   Yes [provider]  amoxicillin-clavulanate (AUGMENTIN) 875-125 MG tablet Take 1 tablet every 12 (twelve) hours by mouth. 07/22/17   Coral Spikes, DO    Family History Family History  Problem Relation Age of Onset  . Thyroid disease Father   . Heart attack Unknown     Social History Social History   Tobacco Use  . Smoking status: Former Smoker      Packs/day: 2.00    Years: 6.00    Pack years: 12.00    Types: Cigarettes    Last attempt to quit: 09/12/1989    Years since quitting: 27.8  . Smokeless tobacco: Never Used  Substance Use Topics  . Alcohol use: Yes    Alcohol/week: 0.0 oz    Comment: occassionally  . Drug use: No     Allergies   Aspirin; Brussels sprouts [brassica oleracea italica]; and Other   Review of Systems Review of Systems  Constitutional: Positive for fatigue.       Patient reports fever but he has not had a true fever.  HENT: Positive for congestion and sore throat.   Respiratory: Positive for cough and shortness of breath.   All other systems reviewed and are negative.  Physical Exam Triage Vital Signs ED Triage Vitals  Enc Vitals Group     BP 07/22/17 1057 111/75     Pulse Rate 07/22/17 1057 90     Resp 07/22/17 1057 18     Temp 07/22/17 1057 98.2 F (36.8 C)     Temp Source 07/22/17 1057 Oral     SpO2 07/22/17 1057 98 %     Weight 07/22/17 1054 206 lb (93.4 kg)     Height 07/22/17 1054 5\' 7"  (1.702 m)     Head Circumference --  Peak Flow --      Pain Score 07/22/17 1055 8     Pain Loc --      Pain Edu? --      Excl. in [place]? --    Updated Vital Signs BP 111/75 (BP Location: Left Arm)   Pulse 90   Temp 98.2 F (36.8 C) (Oral)   Resp 18   Ht 5\' 7"  (1.702 m)   Wt 206 lb (93.4 kg)   SpO2 98%   BMI 32.26 kg/m   Physical Exam  Constitutional: He is oriented to person, place, and time. He appears well-developed and well-nourished.  Appears as if he does not feel well but he is in no apparent distress.  HENT:  Head: Normocephalic and atraumatic.  Mild oropharyngeal erythema.  Normal TMs bilaterally.  Eyes: Conjunctivae are normal. Right eye exhibits no discharge. Left eye exhibits no discharge.  Neck: Neck supple.  Cardiovascular: Normal rate.  Pulmonary/Chest: Effort normal and breath sounds normal. No respiratory distress. He has no wheezes. He has no rales.  Abdominal:  Soft. He exhibits no distension. There is no tenderness.  Lymphadenopathy:    He has no cervical adenopathy.  Neurological: He is alert and oriented to person, place, and time.  Normal tone.  No apparent focal deficits.  Skin: Skin is warm. No rash noted.  Psychiatric: He has a normal mood and affect. His behavior is normal.  Vitals reviewed.  UC Treatments / Results  Labs (all labs ordered are listed, but only abnormal results are displayed) Labs Reviewed - No data to display  EKG  EKG Interpretation None       Radiology Dg Chest 2 View  Result Date: 07/22/2017 CLINICAL DATA:  Productive cough EXAM: CHEST  2 VIEW COMPARISON:  03/22/2016 FINDINGS: Normal heart size. Lungs clear. No pneumothorax. No pleural effusion. Dilated small bowel loops project over the upper abdomen. IMPRESSION: No active cardiopulmonary disease. Dilated small bowel loops are present in the upper abdomen. Electronically Signed   By: Marybelle Killings M.D.   On: 07/22/2017 11:56    Procedures Procedures (including critical Bush time)  Medications Ordered in UC Medications - No data to display   Initial Impression / Assessment and Plan / UC Course  I have reviewed the triage vital signs and the nursing notes.  Pertinent labs & imaging results that were available during my Bush of the patient were reviewed by me and considered in my medical decision making (see chart for details).     51 year old male presents with a respiratory infection.  Chest x-ray negative.  Given the fact that he has recently been sick and acutely worsened again, I am concerned about underlying bacterial cause.  Treating with Augmentin.  Final Clinical Impressions(s) / UC Diagnoses   Final diagnoses:  Respiratory infection    ED Discharge Orders        Ordered    amoxicillin-clavulanate (AUGMENTIN) 875-125 MG tablet  Every 12 hours     07/22/17 1203     Controlled Substance Prescriptions [place] Controlled Substance Registry  consulted? Not Applicable   Coral Spikes, DO 07/22/17 1213

## 2017-09-13 ENCOUNTER — Emergency Department: Payer: BLUE CROSS/BLUE SHIELD

## 2017-09-13 ENCOUNTER — Encounter: Payer: Self-pay | Admitting: Emergency Medicine

## 2017-09-13 ENCOUNTER — Inpatient Hospital Stay
Admission: EM | Admit: 2017-09-13 | Discharge: 2017-09-15 | DRG: 247 | Disposition: A | Discharge status: Home / Self Care | Payer: BLUE CROSS/BLUE SHIELD | Attending: Internal Medicine | Admitting: Internal Medicine

## 2017-09-13 DIAGNOSIS — I214 Non-ST elevation (NSTEMI) myocardial infarction: Secondary | ICD-10-CM | POA: Diagnosis present

## 2017-09-13 DIAGNOSIS — Z886 Allergy status to analgesic agent status: Secondary | ICD-10-CM | POA: Diagnosis not present

## 2017-09-13 DIAGNOSIS — Z91018 Allergy to other foods: Secondary | ICD-10-CM

## 2017-09-13 DIAGNOSIS — I429 Cardiomyopathy, unspecified: Secondary | ICD-10-CM | POA: Diagnosis present

## 2017-09-13 DIAGNOSIS — R42 Dizziness and giddiness: Secondary | ICD-10-CM | POA: Diagnosis present

## 2017-09-13 DIAGNOSIS — E669 Obesity, unspecified: Secondary | ICD-10-CM | POA: Diagnosis present

## 2017-09-13 DIAGNOSIS — Z23 Encounter for immunization: Secondary | ICD-10-CM | POA: Diagnosis not present

## 2017-09-13 DIAGNOSIS — E785 Hyperlipidemia, unspecified: Secondary | ICD-10-CM | POA: Diagnosis present

## 2017-09-13 DIAGNOSIS — Z87891 Personal history of nicotine dependence: Secondary | ICD-10-CM | POA: Diagnosis not present

## 2017-09-13 DIAGNOSIS — E039 Hypothyroidism, unspecified: Secondary | ICD-10-CM | POA: Diagnosis present

## 2017-09-13 DIAGNOSIS — I959 Hypotension, unspecified: Secondary | ICD-10-CM | POA: Diagnosis present

## 2017-09-13 DIAGNOSIS — J45909 Unspecified asthma, uncomplicated: Secondary | ICD-10-CM | POA: Diagnosis present

## 2017-09-13 DIAGNOSIS — Z79899 Other long term (current) drug therapy: Secondary | ICD-10-CM

## 2017-09-13 DIAGNOSIS — R079 Chest pain, unspecified: Secondary | ICD-10-CM

## 2017-09-13 DIAGNOSIS — Z6833 Body mass index (BMI) 33.0-33.9, adult: Secondary | ICD-10-CM | POA: Diagnosis not present

## 2017-09-13 DIAGNOSIS — G4730 Sleep apnea, unspecified: Secondary | ICD-10-CM | POA: Diagnosis present

## 2017-09-13 DIAGNOSIS — R0602 Shortness of breath: Secondary | ICD-10-CM | POA: Diagnosis present

## 2017-09-13 DIAGNOSIS — Z79890 Hormone replacement therapy (postmenopausal): Secondary | ICD-10-CM

## 2017-09-13 DIAGNOSIS — I25119 Atherosclerotic heart disease of native coronary artery with unspecified angina pectoris: Secondary | ICD-10-CM | POA: Diagnosis present

## 2017-09-13 DIAGNOSIS — R001 Bradycardia, unspecified: Secondary | ICD-10-CM | POA: Diagnosis present

## 2017-09-13 LAB — CBC
HEMATOCRIT: 45.2 % (ref 40.0–52.0)
Hemoglobin: 15.7 g/dL (ref 13.0–18.0)
MCH: 29.3 pg (ref 26.0–34.0)
MCHC: 34.7 g/dL (ref 32.0–36.0)
MCV: 84.5 fL (ref 80.0–100.0)
PLATELETS: 210 10*3/uL (ref 150–440)
RBC: 5.34 MIL/uL (ref 4.40–5.90)
RDW: 13.9 % (ref 11.5–14.5)
WBC: 7.4 10*3/uL (ref 3.8–10.6)

## 2017-09-13 LAB — BASIC METABOLIC PANEL
Anion gap: 9 (ref 5–15)
BUN: 20 mg/dL (ref 6–20)
CHLORIDE: 106 mmol/L (ref 101–111)
CO2: 22 mmol/L (ref 22–32)
CREATININE: 1.03 mg/dL (ref 0.61–1.24)
Calcium: 9 mg/dL (ref 8.9–10.3)
GFR calc Af Amer: >60 mL/min (ref >60)
GFR calc non Af Amer: >60 mL/min (ref >60)
Glucose, Bld: 104 mg/dL — ABNORMAL HIGH (ref 65–99)
POTASSIUM: 4.4 mmol/L (ref 3.5–5.1)
Sodium: 137 mmol/L (ref 135–145)

## 2017-09-13 LAB — APTT: APTT: 144 s — AB (ref 24–36)

## 2017-09-13 LAB — TROPONIN I
Troponin I: 1.08 ng/mL (ref <0.03)
Troponin I: 1.57 ng/mL (ref <0.03)

## 2017-09-13 LAB — PROTIME-INR
INR: 1.19
PROTHROMBIN TIME: 15 s (ref 11.4–15.2)

## 2017-09-13 MED ORDER — CLOPIDOGREL BISULFATE 75 MG PO TABS
300.0000 mg | ORAL_TABLET | Freq: Once | ORAL | Status: AC
  Administered 2017-09-13: 300 mg via ORAL
  Filled 2017-09-13: qty 4

## 2017-09-13 MED ORDER — HEPARIN BOLUS VIA INFUSION
4000.0000 [IU] | Freq: Once | INTRAVENOUS | Status: DC
  Filled 2017-09-13: qty 4000

## 2017-09-13 MED ORDER — HEPARIN SODIUM (PORCINE) 5000 UNIT/ML IJ SOLN
INTRAMUSCULAR | Status: AC
  Filled 2017-09-13: qty 1

## 2017-09-13 MED ORDER — MORPHINE SULFATE (PF) 2 MG/ML IV SOLN: 2.0000 mg | INTRAVENOUS | Status: DC | PRN

## 2017-09-13 MED ORDER — ONDANSETRON HCL 4 MG/2ML IJ SOLN
INTRAMUSCULAR | Status: AC
  Filled 2017-09-13: qty 2

## 2017-09-13 MED ORDER — HEPARIN (PORCINE) IN NACL 100-0.45 UNIT/ML-% IJ SOLN
1200.0000 [IU]/h | INTRAMUSCULAR | Status: DC
  Administered 2017-09-13: 1050 [IU]/h via INTRAVENOUS
  Filled 2017-09-13: qty 250

## 2017-09-13 MED ORDER — SODIUM CHLORIDE 0.9 % IV BOLUS (SEPSIS)
1000.0000 mL | Freq: Once | INTRAVENOUS | Status: AC
  Administered 2017-09-13: 1000 mL via INTRAVENOUS

## 2017-09-13 MED ORDER — ONDANSETRON HCL 4 MG/2ML IJ SOLN
4.0000 mg | Freq: Once | INTRAMUSCULAR | Status: AC
  Administered 2017-09-13: 4 mg via INTRAVENOUS

## 2017-09-13 MED ORDER — HEPARIN SODIUM (PORCINE) 5000 UNIT/ML IJ SOLN: 4000.0000 [IU] | Freq: Once | INTRAMUSCULAR | Status: DC

## 2017-09-13 MED ORDER — MORPHINE SULFATE (PF) 2 MG/ML IV SOLN
2.0000 mg | Freq: Once | INTRAVENOUS | Status: AC
  Administered 2017-09-13: 2 mg via INTRAVENOUS
  Filled 2017-09-13: qty 1

## 2017-09-13 MED ORDER — IOPAMIDOL (ISOVUE-370) INJECTION 76%
100.0000 mL | Freq: Once | INTRAVENOUS | Status: AC | PRN
  Administered 2017-09-13: 100 mL via INTRAVENOUS

## 2017-09-13 MED ORDER — HEPARIN SODIUM (PORCINE) 5000 UNIT/ML IJ SOLN
4000.0000 [IU] | Freq: Once | INTRAMUSCULAR | Status: AC
  Administered 2017-09-13: 4000 [IU] via INTRAVENOUS

## 2017-09-13 NOTE — ED Triage Notes (Signed)
Pt comes into the Ed via POV c/o dizziness that started yesterday, numbness and tingling in the left arm, chest pain and shortness of breath.  Patient states the dizziness has been ongoing since yesterday but the numbness and tingling in the arm started today.  Patient alert and oriented x4, but gets lightheaded with movement.  Patient has labored respirations at this time.  Denies any recent injury. Cardiac history.

## 2017-09-13 NOTE — ED Notes (Signed)
Pt states pain increased in severity after morphine. Pt now sitting rigidly in stretcher, hands tightly gripping siderails. Repeat EKG done. EDP Veronese notified.

## 2017-09-13 NOTE — Progress Notes (Signed)
ANTICOAGULATION CONSULT NOTE - Initial Consult  Pharmacy Consult for heparin Indication: chest pain/ACS  Allergies  Allergen Reactions  . Aspirin Itching and Swelling  . Brussels Sprouts [Brassica Oleracea Italica] Swelling  . Other Swelling    OLIVES-SWELLING    Patient Measurements: Height: 5\' 7"  (170.2 cm) Weight: 210 lb (95.3 kg) IBW/kg (Calculated) : 66.1 Heparin Dosing Weight: 86.4 kg  Vital Signs: Temp: 97.6 F (36.4 C) (01/02 2101) Temp Source: Oral (01/02 2101) BP: 115/73 (01/02 2230) Pulse Rate: 75 (01/02 2230)  Labs: Recent Labs    09/13/17 2106  HGB 15.7  HCT 45.2  PLT 210  CREATININE 1.03  TROPONINI 1.08*    Estimated Creatinine Clearance: 95.5 mL/min (by C-G formula based on SCr of 1.03 mg/dL).   Medical History: Past Medical History:  Diagnosis Date  . Asthma   . Hypothermia   . Hypothyroidism   . Pneumonia 2016  . Sleep apnea    USES CPAP  . Wears contact lenses     Medications:  Scheduled:  . heparin      . heparin  4,000 Units Intravenous Once  . heparin  4,000 Units Intravenous Once    Assessment: Patient admitted for dizziness, numbness, tingling, in L arm w/ CP and trops @ 1.08 EKG showing sinus bradycardia w/o ST elevation Patient is being started on heparin for NSTEMI  Goal of Therapy:  Heparin level 0.3-0.7 units/ml Monitor platelets by anticoagulation protocol: Yes   Plan:  Will bolus heparin 4000 units IV x 1 Will start drip @ 1050 units/hr  Will draw HL @ 0500 Baseline labs drawn Will monitor daily CBC's and adjust per HL's  Tobie Lords, PharmD, BCPS Clinical Pharmacist 09/13/2017

## 2017-09-13 NOTE — ED Notes (Signed)
ED Provider at bedside. 

## 2017-09-13 NOTE — ED Notes (Signed)
All pt clothing and jewelry removed and given to pt's spouse.

## 2017-09-13 NOTE — Consult Note (Signed)
Cardiology Indication chest pain possible unstable angina borderline troponins Referring physician emergency room Dr. primary physician Dr. Rosario Jacks  HPI Called by ER to see patient with persistent midsternal chest pain reported hypertension and mild diaphoresis with unremarkable EKG. patient gives a history of chest pain on and off over the last 48-72 hours with nausea no vomiting occasional diaphoresis radiation of pain to the back chest pain worse with sitting up he also had some intermittent numbness of his left forearm no previous cardiac history only on thyroid medication.  Patient gives a history of aspirin.  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  In the emergency room the patient was given 300 of Plavix 2 L of normal saline to help with mild hypotension.  Nitrates were held because of relative hypotension beta-blockers were held because of relative bradycardia. Troponin was found to be 1.08 chemistries are normal CBC normal chest x-ray was unremarkable CT of the chest abdomen also essentially unremarkable.  EKG normal sinus rhythm rate of around 60 nonspecific ST-T wave changes no significant ST depression or elevation.  Physical exam HEENT normocephalic atraumatic pupils equal Esterline Neck exam was supple no significant JVD Lung exam was clear to auscultation percussion Heart exam regular rate rhythm no significant murmur gallops or rubs Abdominal exam was benign positive bowel sounds Extremities are within normal limits Neurologic essentially intact  appeared to be slightly anxious   LAB Negative electrolytes CBC within normal limits Chest x-ray was negative CT of the chest unremarkable negative for PE EKG sinus rhythm nonspecific diffuse change  Impression Does not meet criteria for STEMI Chest pain unclear etiology Relative hypotension Relative bradycardia Borderline obesity Possible obstructive sleep apnea History of thyroid  disease Borderline troponins . Plan  would recommend anticoagulation with heparin for now Agree with Plavix load since the patient is aspirin allergic Follow-up EKGs Repeat troponin II hours since initial Morphine for pain as needed Third liter of normal saline for hydration fluid resuscitation Consider Tylenol for pain as well as low-dose morphine Consider echocardiogram at some point for further assessment Do not recommend emergent cardiac cath until more information troponins are abnormal EKG no worsening symptoms We will consider admission and either functional study or cardiac cath in the morning Case discussed with patient as well as emerge from Dr. Reino Bellis have hospitalist admit to telemetry

## 2017-09-13 NOTE — ED Notes (Signed)
Date and time results received: 09/13/17 10:31 PM  Test: troponin I Critical Value: 1.08  Name of Provider Notified: Dr. Alfred Levins  Orders Received? Or Actions Taken?: Orders Received - See Orders for details

## 2017-09-13 NOTE — ED Provider Notes (Signed)
[name] Emergency Department Provider Note  ____________________________________________  Time seen: Approximately 11:59 PM  I have reviewed the triage vital signs and the nursing notes.   HISTORY  Chief Complaint Dizziness; Chest Pain; and Shortness of Breath   HPI Erik Bush is a 52 y.o. male with a history of hypothyroidism who presents for evaluation of chest pain. Patient reports the chest pain woke him up from his sleep last night he describes as a pressure, 6 out of 10, located in the center of his chest, radiating to his back, associated with shortness of breath and dizziness. Patient denies ever having similar pain. The pain is unchanged with exertion. The pain is nonpleuritic in nature. He denies personal or family history of heart attacks, history of smoking, history of blood clots. Patient reports associated numbness of his left upper extremity and nausea.  Past Medical History:  Diagnosis Date  . Asthma   . Hypothermia   . Hypothyroidism   . Pneumonia 2016  . Sleep apnea    USES CPAP  . Wears contact lenses     Patient Active Problem List   Diagnosis Date Noted  . Non-STEMI (non-ST elevated myocardial infarction) ([place]) 09/13/2017  . Neck mass     Past Surgical History:  Procedure Laterality Date  . ANAL FISTULOTOMY N/A 06/08/2015   Procedure: ANAL FISTULOTOMY;  Surgeon: Christene Lye, MD;  Location: [facility] ORS;  Service: General;  Laterality: N/A;  . ANTERIOR CRUCIATE LIGAMENT REPAIR     knees  . CLOSED REDUCTION NASAL FRACTURE N/A 03/22/2016   Procedure: CLOSED REDUCTION NASAL FRACTURE;  Surgeon: Clyde Canterbury, MD;  Location: [place];  Service: ENT;  Laterality: N/A;  sleep apnea  . LIPOMA EXCISION N/A 04/05/2017   Procedure: EXCISION LIPOMA Nuchal area.  prone position;  Surgeon: Jules Husbands, MD;  Location: [facility] ORS;  Service: General;  Laterality: N/A;  Prone  . MEDIAL COLLATERAL LIGAMENT REPAIR, KNEE      . SHOULDER ARTHROSCOPY Right     Prior to Admission medications   Medication Sig Start Date End Date Taking? Authorizing Provider  levothyroxine (SYNTHROID, LEVOTHROID) 125 MCG tablet Take 125 mcg by mouth daily before breakfast.   Yes [provider]  Multiple Vitamins-Minerals (MULTIVITAMIN WITH MINERALS) tablet Take 1 tablet by mouth daily.   Yes [provider]  amoxicillin-clavulanate (AUGMENTIN) 875-125 MG tablet Take 1 tablet every 12 (twelve) hours by mouth. Patient not taking: Reported on 09/13/2017 07/22/17   Coral Spikes, DO    Allergies Aspirin; Brussels sprouts [brassica oleracea italica]; and Other  Family History  Problem Relation Age of Onset  . Thyroid disease Father   . Heart attack Unknown     Social History Social History   Tobacco Use  . Smoking status: Former Smoker    Packs/day: 2.00    Years: 6.00    Pack years: 12.00    Types: Cigarettes    Last attempt to quit: 09/12/1989    Years since quitting: 28.0  . Smokeless tobacco: Never Used  Substance Use Topics  . Alcohol use: Yes    Alcohol/week: 0.0 oz    Comment: occassionally  . Drug use: No    Review of Systems  Constitutional: Negative for fever. Eyes: Negative for visual changes. ENT: Negative for sore throat. Neck: No neck pain  Cardiovascular: +chest pain. Respiratory: + shortness of breath. Gastrointestinal: Negative for abdominal pain, vomiting or diarrhea. Genitourinary: Negative for dysuria. Musculoskeletal: Negative for back pain.  Skin: Negative for rash. Neurological: Negative for headaches, weakness or numbness. Psych: No SI or HI  ____________________________________________   PHYSICAL EXAM:  VITAL SIGNS: ED Triage Vitals  Enc Vitals Group     BP 09/13/17 2101 (!) 92/58     Pulse Rate 09/13/17 2101 (!) 58     Resp 09/13/17 2101 (!) 24     Temp 09/13/17 2101 97.6 F (36.4 C)     Temp Source 09/13/17 2101 Oral     SpO2 09/13/17 2101 99 %      Weight 09/13/17 2102 210 lb (95.3 kg)     Height 09/13/17 2102 5\' 7"  (1.702 m)     Head Circumference --      Peak Flow --      Pain Score 09/13/17 2101 7     Pain Loc --      Pain Edu? --      Excl. in [place]? --     Constitutional: Alert and oriented, pale, diaphoretic, hypotensive and bradycardic.  HEENT:      Head: Normocephalic and atraumatic.         Eyes: Conjunctivae are normal. Sclera is non-icteric.       Mouth/Throat: Mucous membranes are moist.       Neck: Supple with no signs of meningismus. Cardiovascular: Bradycardic with regular rhythm. No murmurs, gallops, or rubs. 2+ symmetrical distal pulses are present in all extremities. No JVD. Respiratory: Normal respiratory effort. Lungs are clear to auscultation bilaterally. No wheezes, crackles, or rhonchi.  Gastrointestinal: Soft, non tender, and non distended with positive bowel sounds. No rebound or guarding. Musculoskeletal: Nontender with normal range of motion in all extremities. No edema, cyanosis, or erythema of extremities. Neurologic: Normal speech and language. Face is symmetric. Moving all extremities. No gross focal neurologic deficits are appreciated. Skin: Skin is warm, dry and intact. No rash noted. Psychiatric: Mood and affect are normal. Speech and behavior are normal.  ____________________________________________   LABS (all labs ordered are listed, but only abnormal results are displayed)  Labs Reviewed  BASIC METABOLIC PANEL - Abnormal; Notable for the following components:      Result Value   Glucose, Bld 104 (*)    All other components within normal limits  TROPONIN I - Abnormal; Notable for the following components:   Troponin I 1.08 (*)    All other components within normal limits  TROPONIN I - Abnormal; Notable for the following components:   Troponin I 1.57 (*)    All other components within normal limits  APTT - Abnormal; Notable for the following components:   aPTT 144 (*)    All other  components within normal limits  CBC  PROTIME-INR  HEPARIN LEVEL (UNFRACTIONATED)  CBC   ____________________________________________  EKG  ED ECG REPORT I, Rudene Re, the attending physician, personally viewed and interpreted this ECG.  20:57 - sinus bradycardia, normal intervals, normal axis, no ST elevations or depressions.  21:04 - sinus bradycardia, normal intervals, normal axis, no ST elevations or depressions. Unchanged from prior  22:31 - normal sinus rhythm, normal intervals, normal axis, no ST elevations or depressions.  23:13 - normal sinus rhythm, normal axis, no ST elevations or depressions, normal intervals.   ____________________________________________  [id number]  CTA chest: Negative  ____________________________________________   PROCEDURES  Procedure(s) performed: None Procedures Critical Care performed: yes  CRITICAL CARE Performed by: Rudene Re  ?  Total critical care time: 40 min  Critical care time was exclusive of separately billable procedures and  treating other patients.  Critical care was necessary to treat or prevent imminent or life-threatening deterioration.  Critical care was time spent personally by me on the following activities: development of treatment plan with patient and/or surrogate as well as nursing, discussions with consultants, evaluation of patient's response to treatment, examination of patient, obtaining history from patient or surrogate, ordering and performing treatments and interventions, ordering and review of laboratory studies, ordering and review of radiographic studies, pulse oximetry and re-evaluation of patient's condition.  ____________________________________________   INITIAL IMPRESSION / ASSESSMENT AND PLAN / ED COURSE  52 y.o. male with a history of hypothyroidism who presents for evaluation of chest pain. Patient looks pale, diaphoretic, hypotensive and bradycardic on arrival, bedside  ultrasound was done showing normal RV, no pericardial effusion, no AAA. Patient had 3 EKGs done showing no evidence of ischemia therefore he was taken for CT angiogram was negative for PE, dissection, pericardial effusion. Patient continued to complain of 6 out of 10 pain with episodes of hypotension with systolics in the 56L. He was started on fluids, heparin, he was loaded with Plavix due to aspirin anaphylaxis. I consulted Dr. Clayborn Bigness, cardiologist on call who agrees that EKGs do not meet STEMI criteria and did not show any signs of ischemia. First troponin elevated at 1.06. Presentation is concerning for NSTEMI. Dr. Clayborn Bigness on his way to evaluate patient.   Clinical Course as of Sep 14 36  Wed Sep 13, 2017  2307 Dr. Clayborn Bigness at the bedside. Wishes to repeat troponin and if trending up will take patient to cath lab, in the meantime agress with treatment with plavix, heparin, and IVF  [CV]    Clinical Course User Index [CV] Alfred Levins, [place], MD   _________________________ 12:04 AM on 09/14/2017 -----------------------------------------  Second troponin trending up 1.57. Patient on heparin. Dr. Clayborn Bigness advised against cath this evening. Patient will be admitted to the Hospitalist service.  As part of my medical decision making, I reviewed the following data within the [facility] notes reviewed and incorporated, Labs reviewed , EKG interpreted , Radiograph reviewed , Discussed with admitting physician , A consult was requested and obtained from this/these consultant(s) Cardiology, Notes from prior ED visits and [place] Controlled Substance Database    Pertinent labs & imaging results that were available during my care of the patient were reviewed by me and considered in my medical decision making (see chart for details).    ____________________________________________   FINAL CLINICAL IMPRESSION(S) / ED DIAGNOSES  Final diagnoses:  NSTEMI (non-ST elevated  myocardial infarction) ([place])      NEW MEDICATIONS STARTED DURING THIS VISIT:  ED Discharge Orders    None       Note:  This document was prepared using Dragon voice recognition software and may include unintentional dictation errors.    Alfred Levins, [place], MD 09/14/17 [phone]

## 2017-09-14 ENCOUNTER — Encounter
Admission: EM | Disposition: A | Discharge status: Home / Self Care | Payer: Self-pay | Source: Home / Self Care | Attending: Internal Medicine

## 2017-09-14 ENCOUNTER — Other Ambulatory Visit: Payer: Self-pay

## 2017-09-14 HISTORY — PX: LEFT HEART CATH AND CORONARY ANGIOGRAPHY: CATH118249

## 2017-09-14 LAB — BASIC METABOLIC PANEL
Anion gap: 6 (ref 5–15)
BUN: 14 mg/dL (ref 6–20)
CHLORIDE: 108 mmol/L (ref 101–111)
CO2: 24 mmol/L (ref 22–32)
Calcium: 8.3 mg/dL — ABNORMAL LOW (ref 8.9–10.3)
Creatinine, Ser: 0.72 mg/dL (ref 0.61–1.24)
GFR calc Af Amer: >60 mL/min (ref >60)
GFR calc non Af Amer: >60 mL/min (ref >60)
GLUCOSE: 96 mg/dL (ref 65–99)
Potassium: 4 mmol/L (ref 3.5–5.1)
SODIUM: 138 mmol/L (ref 135–145)

## 2017-09-14 LAB — CBC
HCT: 41 % (ref 40.0–52.0)
Hemoglobin: 14.1 g/dL (ref 13.0–18.0)
MCH: 29.5 pg (ref 26.0–34.0)
MCHC: 34.3 g/dL (ref 32.0–36.0)
MCV: 85.9 fL (ref 80.0–100.0)
PLATELETS: 181 10*3/uL (ref 150–440)
RBC: 4.78 MIL/uL (ref 4.40–5.90)
RDW: 14 % (ref 11.5–14.5)
WBC: 5.7 10*3/uL (ref 3.8–10.6)

## 2017-09-14 LAB — TROPONIN I
Troponin I: 2.38 ng/mL (ref <0.03)
Troponin I: 2.36 ng/mL (ref <0.03)
Troponin I: 2.05 ng/mL (ref <0.03)

## 2017-09-14 LAB — LIPID PANEL
CHOLESTEROL: 188 mg/dL (ref 0–200)
HDL: 36 mg/dL — ABNORMAL LOW (ref >40)
LDL Cholesterol: 107 mg/dL — ABNORMAL HIGH (ref 0–99)
Total CHOL/HDL Ratio: 5.2 RATIO
Triglycerides: 225 mg/dL — ABNORMAL HIGH (ref <150)
VLDL: 45 mg/dL — ABNORMAL HIGH (ref 0–40)

## 2017-09-14 LAB — HEPARIN LEVEL (UNFRACTIONATED)
HEPARIN UNFRACTIONATED: 0.24 [IU]/mL — AB (ref 0.30–0.70)
Heparin Unfractionated: 0.37 IU/mL (ref 0.30–0.70)
Heparin Unfractionated: <0.1 IU/mL — ABNORMAL LOW (ref 0.30–0.70)

## 2017-09-14 LAB — POCT ACTIVATED CLOTTING TIME: Activated Clotting Time: 351 seconds

## 2017-09-14 SURGERY — LEFT HEART CATH AND CORONARY ANGIOGRAPHY
Anesthesia: Moderate Sedation

## 2017-09-14 MED ORDER — LEVOTHYROXINE SODIUM 25 MCG PO TABS
125.0000 ug | ORAL_TABLET | Freq: Every day | ORAL | Status: DC
  Administered 2017-09-14 – 2017-09-15 (×2): 125 ug via ORAL
  Filled 2017-09-14 (×2): qty 1

## 2017-09-14 MED ORDER — ACETAMINOPHEN 325 MG PO TABS
ORAL_TABLET | ORAL | Status: AC
  Administered 2017-09-14: 15:00:00
  Filled 2017-09-14: qty 2

## 2017-09-14 MED ORDER — KETOROLAC TROMETHAMINE 15 MG/ML IJ SOLN: 15.0000 mg | Freq: Three times a day (TID) | INTRAMUSCULAR | Status: DC | PRN

## 2017-09-14 MED ORDER — ACETAMINOPHEN 325 MG PO TABS: 650.0000 mg | ORAL_TABLET | ORAL | Status: DC | PRN

## 2017-09-14 MED ORDER — CLOPIDOGREL BISULFATE 75 MG PO TABS: 75.0000 mg | ORAL_TABLET | ORAL | Status: DC

## 2017-09-14 MED ORDER — MIDAZOLAM HCL 2 MG/2ML IJ SOLN
INTRAMUSCULAR | Status: DC | PRN
Start: 2017-09-14 — End: 2017-09-14
  Administered 2017-09-14: 1 mg via INTRAVENOUS

## 2017-09-14 MED ORDER — BIVALIRUDIN TRIFLUOROACETATE 250 MG IV SOLR
INTRAVENOUS | Status: AC
  Filled 2017-09-14: qty 250

## 2017-09-14 MED ORDER — NITROGLYCERIN 0.4 MG SL SUBL
SUBLINGUAL_TABLET | SUBLINGUAL | Status: AC
  Administered 2017-09-14: 13:00:00
  Filled 2017-09-14: qty 1

## 2017-09-14 MED ORDER — ADULT MULTIVITAMIN W/MINERALS CH
1.0000 | ORAL_TABLET | Freq: Every day | ORAL | Status: DC
  Administered 2017-09-14 – 2017-09-15 (×2): 1 via ORAL
  Filled 2017-09-14 (×2): qty 1

## 2017-09-14 MED ORDER — HEPARIN (PORCINE) IN NACL 2-0.9 UNIT/ML-% IJ SOLN
INTRAMUSCULAR | Status: AC
Start: 2017-09-14 — End: 2017-09-14
  Filled 2017-09-14: qty 500

## 2017-09-14 MED ORDER — IOPAMIDOL (ISOVUE-300) INJECTION 61%
INTRAVENOUS | Status: DC | PRN
  Administered 2017-09-14: 285 mL via INTRA_ARTERIAL

## 2017-09-14 MED ORDER — SODIUM CHLORIDE 0.9% FLUSH: 3.0000 mL | Freq: Two times a day (BID) | INTRAVENOUS | Status: DC

## 2017-09-14 MED ORDER — FENTANYL CITRATE (PF) 100 MCG/2ML IJ SOLN
INTRAMUSCULAR | Status: AC
  Filled 2017-09-14: qty 2

## 2017-09-14 MED ORDER — SODIUM CHLORIDE 0.9 % IV SOLN
INTRAVENOUS | Status: DC
  Administered 2017-09-14: 02:00:00 via INTRAVENOUS

## 2017-09-14 MED ORDER — HEPARIN BOLUS VIA INFUSION
1300.0000 [IU] | Freq: Once | INTRAVENOUS | Status: AC
  Administered 2017-09-14: 1300 [IU] via INTRAVENOUS
  Filled 2017-09-14: qty 1300

## 2017-09-14 MED ORDER — CLOPIDOGREL BISULFATE 75 MG PO TABS
75.0000 mg | ORAL_TABLET | Freq: Every day | ORAL | Status: DC
  Administered 2017-09-15: 75 mg via ORAL
  Filled 2017-09-14: qty 1

## 2017-09-14 MED ORDER — HYDRALAZINE HCL 20 MG/ML IJ SOLN: 5.0000 mg | INTRAMUSCULAR | Status: AC | PRN

## 2017-09-14 MED ORDER — HEPARIN (PORCINE) IN NACL 2-0.9 UNIT/ML-% IJ SOLN
INTRAMUSCULAR | Status: AC
  Filled 2017-09-14: qty 500

## 2017-09-14 MED ORDER — SODIUM CHLORIDE 0.9 % IV SOLN: 250.0000 mL | INTRAVENOUS | Status: DC | PRN

## 2017-09-14 MED ORDER — NITROGLYCERIN 1 MG/10 ML FOR IR/CATH LAB
INTRA_ARTERIAL | Status: DC | PRN
  Administered 2017-09-14: 200 ug via INTRACORONARY

## 2017-09-14 MED ORDER — ONDANSETRON HCL 4 MG/2ML IJ SOLN: 4.0000 mg | Freq: Four times a day (QID) | INTRAMUSCULAR | Status: DC | PRN

## 2017-09-14 MED ORDER — SODIUM CHLORIDE 0.9% FLUSH: 3.0000 mL | INTRAVENOUS | Status: DC | PRN

## 2017-09-14 MED ORDER — INFLUENZA VAC SPLIT QUAD 0.5 ML IM SUSY
0.5000 mL | PREFILLED_SYRINGE | INTRAMUSCULAR | Status: AC
  Administered 2017-09-15: 0.5 mL via INTRAMUSCULAR
  Filled 2017-09-14: qty 0.5

## 2017-09-14 MED ORDER — NITROGLYCERIN 0.4 MG SL SUBL
0.4000 mg | SUBLINGUAL_TABLET | SUBLINGUAL | Status: AC | PRN
  Administered 2017-09-14 (×3): 0.4 mg via SUBLINGUAL
  Filled 2017-09-14: qty 1

## 2017-09-14 MED ORDER — CLOPIDOGREL BISULFATE 75 MG PO TABS
ORAL_TABLET | ORAL | Status: AC
  Filled 2017-09-14: qty 3

## 2017-09-14 MED ORDER — CLOPIDOGREL BISULFATE 75 MG PO TABS
ORAL_TABLET | ORAL | Status: DC | PRN
  Administered 2017-09-14: 225 mg via ORAL

## 2017-09-14 MED ORDER — SODIUM CHLORIDE 0.9 % WEIGHT BASED INFUSION
3.0000 mL/kg/h | INTRAVENOUS | Status: AC
  Administered 2017-09-14: 3 mL/kg/h via INTRAVENOUS

## 2017-09-14 MED ORDER — SODIUM CHLORIDE 0.9 % IV SOLN
INTRAVENOUS | Status: AC | PRN
  Administered 2017-09-14: 1.75 mg/kg/h via INTRAVENOUS

## 2017-09-14 MED ORDER — SODIUM CHLORIDE 0.9% FLUSH
3.0000 mL | Freq: Two times a day (BID) | INTRAVENOUS | Status: DC
  Administered 2017-09-14: 3 mL via INTRAVENOUS

## 2017-09-14 MED ORDER — SODIUM CHLORIDE 0.9 % WEIGHT BASED INFUSION
1.0000 mL/kg/h | INTRAVENOUS | Status: DC
  Administered 2017-09-14: 1 mL/kg/h via INTRAVENOUS

## 2017-09-14 MED ORDER — MIDAZOLAM HCL 2 MG/2ML IJ SOLN
INTRAMUSCULAR | Status: AC
  Filled 2017-09-14: qty 2

## 2017-09-14 MED ORDER — ACETAMINOPHEN 325 MG PO TABS
650.0000 mg | ORAL_TABLET | ORAL | Status: DC | PRN
  Administered 2017-09-14: 650 mg via ORAL

## 2017-09-14 MED ORDER — LABETALOL HCL 5 MG/ML IV SOLN: 10.0000 mg | INTRAVENOUS | Status: AC | PRN

## 2017-09-14 MED ORDER — NITROGLYCERIN 5 MG/ML IV SOLN
INTRAVENOUS | Status: AC
  Filled 2017-09-14: qty 10

## 2017-09-14 MED ORDER — ATORVASTATIN CALCIUM 20 MG PO TABS
80.0000 mg | ORAL_TABLET | Freq: Every day | ORAL | Status: DC
  Administered 2017-09-14: 80 mg via ORAL
  Filled 2017-09-14: qty 4

## 2017-09-14 MED ORDER — SODIUM CHLORIDE 0.9 % WEIGHT BASED INFUSION: 1.0000 mL/kg/h | INTRAVENOUS | Status: DC

## 2017-09-14 MED ORDER — FENTANYL CITRATE (PF) 100 MCG/2ML IJ SOLN
INTRAMUSCULAR | Status: DC | PRN
  Administered 2017-09-14: 25 ug via INTRAVENOUS

## 2017-09-14 MED ORDER — BIVALIRUDIN BOLUS VIA INFUSION - CUPID
INTRAVENOUS | Status: DC | PRN
  Administered 2017-09-14: 73.5 mg via INTRAVENOUS

## 2017-09-14 SURGICAL SUPPLY — 21 items
BALLN MINITREK RX 2.0X20 (BALLOONS) ×3
BALLN TREK RX 2.5X15 (BALLOONS) ×3
BALLN ~~LOC~~ TREK RX 4.5X20 (BALLOONS) ×3
BALLOON MINITREK RX 2.0X20 (BALLOONS) ×1 IMPLANT
BALLOON TREK RX 2.5X15 (BALLOONS) ×1 IMPLANT
BALLOON ~~LOC~~ TREK RX 4.5X20 (BALLOONS) ×1 IMPLANT
CATH INFINITI 5FR ANG PIGTAIL (CATHETERS) ×3 IMPLANT
CATH INFINITI 5FR JL4 (CATHETERS) ×3 IMPLANT
CATH INFINITI JR4 5F (CATHETERS) ×3 IMPLANT
CATH VISTA GUIDE 6FR JR4 (CATHETERS) ×3 IMPLANT
DEVICE CLOSURE MYNXGRIP 6/7F (Vascular Products) ×3 IMPLANT
DEVICE INFLAT 30 PLUS (MISCELLANEOUS) ×3 IMPLANT
KIT MANI 3VAL PERCEP (MISCELLANEOUS) ×3 IMPLANT
NEEDLE PERC 18GX7CM (NEEDLE) ×6 IMPLANT
PACK CARDIAC CATH (CUSTOM PROCEDURE TRAY) ×3 IMPLANT
SHEATH AVANTI 5FR X 11CM (SHEATH) ×6 IMPLANT
SHEATH AVANTI 6FR X 11CM (SHEATH) ×3 IMPLANT
STENT SIERRA 4.00 X 12 MM (Permanent Stent) ×3 IMPLANT
STENT SIERRA 4.00 X 33 MM (Permanent Stent) ×3 IMPLANT
WIRE ASAHI PROWATER 180CM (WIRE) ×3 IMPLANT
WIRE EMERALD 3MM-J .035X150CM (WIRE) ×3 IMPLANT

## 2017-09-14 NOTE — Progress Notes (Signed)
[facility] [place] NAME: Erik Bush    MR#:  [id number]  DATE OF BIRTH:  July 20, 1966  SUBJECTIVE:  Patient came in with some ongoing chest pain.  Found to have elevated troponin.  Patient had earlier chest pain episode.  Wife in the room.  Received nitroglycerin.  Chest pain has eased off some.  REVIEW OF SYSTEMS:   Review of Systems  Constitutional: Negative for chills, fever and weight loss.  HENT: Negative for ear discharge, ear pain and nosebleeds.   Eyes: Negative for blurred vision, pain and discharge.  Respiratory: Negative for sputum production, shortness of breath, wheezing and stridor.   Cardiovascular: Positive for chest pain. Negative for palpitations, orthopnea and PND.  Gastrointestinal: Negative for abdominal pain, diarrhea, nausea and vomiting.  Genitourinary: Negative for frequency and urgency.  Musculoskeletal: Negative for back pain and joint pain.  Neurological: Positive for weakness. Negative for sensory change, speech change and focal weakness.  Psychiatric/Behavioral: Negative for depression and hallucinations. The patient is not nervous/anxious.    Tolerating Diet:npo Tolerating PT: ambulatory  DRUG ALLERGIES:   Allergies  Allergen Reactions  . Aspirin Itching and Swelling  . Brussels Sprouts [Brassica Oleracea Italica] Swelling  . Other Swelling    OLIVES-SWELLING  . Morphine And Related     Had tingling fingers,chest pain,tightness after dose.    VITALS:  Blood pressure 101/77, pulse 63, temperature 98.1 F (36.7 C), temperature source Oral, resp. rate 20, height 5\' 7"  (1.702 m), weight 98 kg (216 lb), SpO2 99 %.  PHYSICAL EXAMINATION:   Physical Exam  GENERAL:  52 y.o.-year-old patient lying in the bed with no acute distress.  EYES: Pupils equal, round, reactive to light and accommodation. No scleral icterus. Extraocular muscles intact.  HEENT: Head atraumatic, normocephalic. Oropharynx  and nasopharynx clear.  NECK:  Supple, no jugular venous distention. No thyroid enlargement, no tenderness.  LUNGS: Normal breath sounds bilaterally, no wheezing, rales, rhonchi. No use of accessory muscles of respiration.  CARDIOVASCULAR: S1, S2 normal. No murmurs, rubs, or gallops.  ABDOMEN: Soft, nontender, nondistended. Bowel sounds present. No organomegaly or mass.  EXTREMITIES: No cyanosis, clubbing or edema b/l.    NEUROLOGIC: Cranial nerves II through XII are intact. No focal Motor or sensory deficits b/l.   PSYCHIATRIC:  patient is alert and oriented x 3.  SKIN: No obvious rash, lesion, or ulcer.   LABORATORY PANEL:  CBC Recent Labs  Lab 09/14/17 0507  WBC 5.7  HGB 14.1  HCT 41.0  PLT 181    Chemistries  Recent Labs  Lab 09/14/17 0507  NA 138  K 4.0  CL 108  CO2 24  GLUCOSE 96  BUN 14  CREATININE 0.72  CALCIUM 8.3*   Cardiac Enzymes Recent Labs  Lab 09/14/17 1107  TROPONINI 2.36*   RADIOLOGY:  Dg Chest 1 View  Result Date: 09/13/2017 CLINICAL DATA:  Shortness of breath and chest pain EXAM: CHEST 1 VIEW COMPARISON:  July 22, 2017 FINDINGS: There is no edema or consolidation. Heart is upper normal in size with pulmonary vascularity within normal limits. No pneumothorax. No adenopathy. No bone lesions. IMPRESSION: No edema or consolidation. Electronically Signed   By: Lowella Grip III M.D.   On: 09/13/2017 21:48   Ct Angio Chest Aorta W/cm &/or Wo/cm  Result Date: 09/13/2017 CLINICAL DATA:  Chest pain, cardiac etiology is suspected. Left arm numbness and tingling. Shortness of breath. Dizziness. EXAM: CT ANGIOGRAPHY CHEST WITH CONTRAST TECHNIQUE: Multidetector  CT imaging of the chest was performed using the standard protocol during bolus administration of intravenous contrast. Multiplanar CT image reconstructions and MIPs were obtained to evaluate the vascular anatomy. CONTRAST:  159mL ISOVUE-370 IOPAMIDOL (ISOVUE-370) INJECTION 76% COMPARISON:  Chest  radiograph earlier this day. FINDINGS: Cardiovascular: Thoracic aorta is normal in caliber. No dissection, aortic hematoma or aneurysm. No significant aortic atherosclerosis. Conventional branching pattern from the aortic arch. No filling defects in the central pulmonary arteries to suggest pulmonary embolus. There are coronary artery calcifications. No pericardial fluid. Mediastinum/Nodes: No enlarged mediastinal or hilar lymph nodes. The esophagus is decompressed, tiny hiatal hernia. Visualized thyroid gland is normal. Lungs/Pleura: Mild central bronchial thickening. Lower lobe atelectasis, right greater than left. No confluent consolidation. No pulmonary edema. No pleural fluid. 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 No acute abnormality. Musculoskeletal: Again seen bone island in the midthoracic vertebra. There are no acute or suspicious osseous abnormalities. Review of the MIP images confirms the above findings. IMPRESSION: 1. Mild central bronchial thickening. 2. No aortic dissection or acute aortic syndrome. 3. Coronary artery calcifications. Electronically Signed   By: Jeb Levering M.D.   On: 09/13/2017 22:21   ASSESSMENT AND PLAN:   Erik Bush  is a 51 y.o. male with a known history of hypothyroidism,  bronchial asthma, sleep apnea presented to the emergency room with chest pain of 48-72 hours duration.  Patient also had nausea but no vomiting occasional diaphoresis and radiation of the pain to the back.  The patient is allergic to aspirin.  EKG did not reveal any STEMI.  1.Acute non-Q wave MI -Patient came in with persistent chest pain.  EKG did not reveal any ST MI. -Troponin were 1.08--1.57--2.38--2.36 -IV heparin drip, aspirin, Plavix, statins and beta-blockers -Cardiology to do cardiac cath today -Some chest pain.  Got better with nitroglycerin sublingual -Spoke with Dr. Clayborn Bigness  2.  Hyperlipidemia -Continue  statins  3.  Hypothyroidism continue Synthroid  4.  Asthma -Stable.  Oxygen as needed  Discussed with patient and wife  Case discussed with Care Management/Social Worker. Management plans discussed with the patient, family and they are in agreement.  CODE STATUS: full  DVT Prophylaxis: heparin gtt  TOTAL TIME TAKING CARE OF THIS PATIENT: *30* minutes.  >50% time spent on counselling and coordination of care  POSSIBLE D/C IN 1-2 DAYS, DEPENDING ON CLINICAL CONDITION.  Note: This dictation was prepared with Dragon dictation along with smaller phrase technology. Any transcriptional errors that result from this process are unintentional.  Fritzi Mandes M.D on 09/14/2017 at 12:29 PM  Between 7am to 6pm - Pager - [phone]  After 6pm go to [email or web address] - password [email or web address] [facility]  Office  [phone]  CC: Primary care physician; Casilda Carls, MDPatient ID: Erik Bush, male   DOB: 06-23-1966, 52 y.o.   MRN: [id number]

## 2017-09-14 NOTE — Progress Notes (Addendum)
Patient returned from cardiac cath s/p PCI, on bed rest until 1800. No complaints of pain. Right femoral vascular site clean, dry and intact at level 0. No complaints of pain at this time. Will continue to assess and monitor.

## 2017-09-14 NOTE — Care Management Note (Signed)
Case Management Note  Patient Details  Name: Erik Bush MRN: [id number] Date of Birth: [birth year]  Subjective/Objective:                 Admitted with chest pain and elevated troponins indicating nstemi.  Second is 2.38. No previous cardiac history.  Chronic cpap. Heparin drip. Cardiology is consulting  Action/Plan:   Expected Discharge Date:                  Expected Discharge Plan:     In-House Referral:     Discharge planning Services     Post Acute Care Choice:    Choice offered to:     DME Arranged:    DME Agency:     HH Arranged:    HH Agency:     Status of Service:     If discussed at [employer], dates discussed:    Additional Comments:  Katrina Stack, RN 09/14/2017, 8:44 AM

## 2017-09-14 NOTE — Progress Notes (Signed)
Subjective:  Still having some chest pain symptoms relatively constant moderate no shortness of breath  Objective:  Vital Signs in the last 24 hours: Temp:  [97.6 F (36.4 C)-98 F (36.7 C)] 97.8 F (36.6 C) (01/03 0806) Pulse Rate:  [58-75] 59 (01/03 0806) Resp:  [13-32] 18 (01/03 0547) BP: (86-126)/(58-89) 112/70 (01/03 0806) SpO2:  [95 %-100 %] 96 % (01/03 0806) Weight:  [210 lb (95.3 kg)-216 lb 11.2 oz (98.3 kg)] 216 lb 11.2 oz (98.3 kg) (01/03 0127)  Intake/Output from previous day: 01/02 0701 - 01/03 0700 In: 2069.2 [I.V.:69.2; IV Piggyback:2000] Out: 0  Intake/Output from this shift: No intake/output data recorded.  Physical Exam: General appearance: appears stated age Neck: no adenopathy, no carotid bruit, no JVD, supple, symmetrical, trachea midline and thyroid not enlarged, symmetric, no tenderness/mass/nodules Lungs: clear to auscultation bilaterally Heart: regular rate and rhythm, S1, S2 normal, no murmur, click, rub or gallop Abdomen: soft, non-tender; bowel sounds normal; no masses,  no organomegaly Extremities: extremities normal, atraumatic, no cyanosis or edema Pulses: 2+ and symmetric Skin: Skin color, texture, turgor normal. No rashes or lesions Neurologic: Alert and oriented X 3, normal strength and tone. Normal symmetric reflexes. Normal coordination and gait  Lab Results: Recent Labs    09/13/17 2106 09/14/17 0507  WBC 7.4 5.7  HGB 15.7 14.1  PLT 210 181   Recent Labs    09/13/17 2106 09/14/17 0507  NA 137 138  K 4.4 4.0  CL 106 108  CO2 22 24  GLUCOSE 104* 96  BUN 20 14  CREATININE 1.03 0.72   Recent Labs    09/13/17 2257 09/14/17 0507  TROPONINI 1.57* 2.38*   Hepatic Function Panel No results for input(s): PROT, ALBUMIN, AST, ALT, ALKPHOS, BILITOT, BILIDIR, IBILI in the last 72 hours. Recent Labs    09/14/17 0507  CHOL 188   No results for input(s): PROTIME in the last 72 hours.  Imaging: Imaging results have been  reviewed  Cardiac Studies:  Assessment/Plan:  Angina Chest Pain Shortness of Breath  Elevated troponin Possible unstable angina Hyperlipidemia Possible obstructive sleep apnea Mild obesity . Plan Agree with admission follow-up EKGs and troponins Continue IV heparin therapy for anticoagulation Maintain Plavix therapy for antiplatelet patient allergic to aspirin Echocardiogram will be helpful for further assessment Recommend cardiac cath today for evaluation of coronary in the face of elevated troponins persistent chest pain Dr. Saralyn Pilar has agreed to proceed with the procedure today Risk and benefits of cardiac cath and possible PCI have been discussed with the patient and he agrees and understands   LOS: 1 day    Erik Bush D Jakoby Melendrez 09/14/2017, 8:55 AM

## 2017-09-14 NOTE — Progress Notes (Signed)
Pt sated that exhaltion port too loud ,  Taken off cpap per pt request ,

## 2017-09-14 NOTE — Progress Notes (Signed)
Pt wears CPAP at bedtime at home, but no orders were placed for him to have one here. MD paged, Dr. Duane Boston gave orders to place CPAP at bedtime. Will continue to monitor. Conley Simmonds, RN, BSN

## 2017-09-14 NOTE — Progress Notes (Signed)
Pt has home CPAP. CPAP is plugged into red outlet. CPAP has no signs of damage. Distilled water placed in CPAP humidifier per pt request. Pt states that he takes CPAP on and off as needed. Pt encouraged to call should he need assistance.

## 2017-09-14 NOTE — H&P (Signed)
[facility] [place] NAME: Erik Bush    MR#:  [id number]  DATE OF BIRTH:  11/22/1965  DATE OF ADMISSION:  09/13/2017  PRIMARY CARE PHYSICIAN: Casilda Carls, MD   REQUESTING/REFERRING PHYSICIAN:   CHIEF COMPLAINT:   Chief Complaint  Patient presents with  . Dizziness  . Chest Pain  . Shortness of Breath    HISTORY OF PRESENT ILLNESS: Erik Bush  is a 52 y.o. male with a known history of hypothyroidism, hypothermia, bronchial asthma, sleep apnea presented to the emergency room with chest pain of 48-72 hours duration.  Patient also had nausea but no vomiting occasional diaphoresis and radiation of the pain to the back.  The patient is allergic to aspirin.  EKG did not reveal any STEMI. Patient had 3 EKGs done in the emergency room. Patient was loaded with oral Plavix 300 mg and his blood pressure was low when he presented to the emergency room he was given normal saline bolus 3 L.First set of troponin was elevated around 1.08.  Cardiology consultation was done and patient was seen by Dr. Clayborn Bigness who recommended heparin drip and medical management and did not recommend any emergent cath.  Patient was worked up with CTA chest which showed no aortic dissection. Nitrates were held because of low blood pressure and beta-blockers were held because of bradycardia.  PAST MEDICAL HISTORY:   Past Medical History:  Diagnosis Date  . Asthma   . Hypothermia   . Hypothyroidism   . Pneumonia 2016  . Sleep apnea    USES CPAP  . Wears contact lenses     PAST SURGICAL HISTORY:  Past Surgical History:  Procedure Laterality Date  . ANAL FISTULOTOMY N/A 06/08/2015   Procedure: ANAL FISTULOTOMY;  Surgeon: Christene Lye, MD;  Location: [facility];  Service: General;  Laterality: N/A;  . ANTERIOR CRUCIATE LIGAMENT REPAIR     knees  . CLOSED REDUCTION NASAL FRACTURE N/A 03/22/2016   Procedure: CLOSED REDUCTION NASAL FRACTURE;  Surgeon:  Clyde Canterbury, MD;  Location: [facility];  Service: ENT;  Laterality: N/A;  sleep apnea  . LIPOMA EXCISION N/A 04/05/2017   Procedure: EXCISION LIPOMA Nuchal area.  prone position;  Surgeon: Jules Husbands, MD;  Location: [facility];  Service: General;  Laterality: N/A;  Prone  . MEDIAL COLLATERAL LIGAMENT REPAIR, KNEE    . SHOULDER ARTHROSCOPY Right     SOCIAL HISTORY:  Social History   Tobacco Use  . Smoking status: Former Smoker    Packs/day: 2.00    Years: 6.00    Pack years: 12.00    Types: Cigarettes    Last attempt to quit: 09/12/1989    Years since quitting: 28.0  . Smokeless tobacco: Never Used  Substance Use Topics  . Alcohol use: Yes    Alcohol/week: 0.0 oz    Comment: occassionally    FAMILY HISTORY:  Family History  Problem Relation Age of Onset  . Thyroid disease Father   . Heart attack Unknown     DRUG ALLERGIES:  Allergies  Allergen Reactions  . Aspirin Itching and Swelling  . Brussels Sprouts [Brassica Oleracea Italica] Swelling  . Other Swelling    OLIVES-SWELLING    REVIEW OF SYSTEMS:   CONSTITUTIONAL: No fever, fatigue or weakness.  EYES: No blurred or double vision.  EARS, NOSE, AND THROAT: No tinnitus or ear pain.  RESPIRATORY: No cough, shortness of breath, wheezing or hemoptysis.  CARDIOVASCULAR: Has chest pain,  no orthopnea, edema.  GASTROINTESTINAL: No nausea, vomiting, diarrhea or abdominal pain.  GENITOURINARY: No dysuria, hematuria.  ENDOCRINE: No polyuria, nocturia,  HEMATOLOGY: No anemia, easy bruising or bleeding SKIN: No rash or lesion. MUSCULOSKELETAL: No joint pain or arthritis.   NEUROLOGIC: No tingling, numbness, weakness.  PSYCHIATRY: No anxiety or depression.   MEDICATIONS AT HOME:  Prior to Admission medications   Medication Sig Start Date End Date Taking? Authorizing Provider  levothyroxine (SYNTHROID, LEVOTHROID) 125 MCG tablet Take 125 mcg by mouth daily before breakfast.   Yes [provider]   Multiple Vitamins-Minerals (MULTIVITAMIN WITH MINERALS) tablet Take 1 tablet by mouth daily.   Yes [provider]  amoxicillin-clavulanate (AUGMENTIN) 875-125 MG tablet Take 1 tablet every 12 (twelve) hours by mouth. Patient not taking: Reported on 09/13/2017 07/22/17   Coral Spikes, DO      PHYSICAL EXAMINATION:   VITAL SIGNS: Blood pressure 108/69, pulse 63, temperature 97.6 F (36.4 C), temperature source Oral, resp. rate 16, height 5\' 7"  (1.702 m), weight 95.3 kg (210 lb), SpO2 99 %.  GENERAL:  52 y.o.-year-old well built male patient lying in the bed with no acute distress.  EYES: Pupils equal, round, reactive to light and accommodation. No scleral icterus. Extraocular muscles intact.  HEENT: Head atraumatic, normocephalic. Oropharynx and nasopharynx clear.  NECK:  Supple, no jugular venous distention. No thyroid enlargement, no tenderness.  LUNGS: Normal breath sounds bilaterally, no wheezing, rales,rhonchi or crepitation. No use of accessory muscles of respiration.  CARDIOVASCULAR: S1, S2 normal. No murmurs, rubs, or gallops.  ABDOMEN: Soft, nontender, nondistended. Bowel sounds present. No organomegaly or mass.  EXTREMITIES: No pedal edema, cyanosis, or clubbing.  NEUROLOGIC: Cranial nerves II through XII are intact. Muscle strength 5/5 in all extremities. Sensation intact. Gait not checked.  PSYCHIATRIC: The patient is alert and oriented x 3.  SKIN: No obvious rash, lesion, or ulcer.   LABORATORY PANEL:   CBC Recent Labs  Lab 09/13/17 2106  WBC 7.4  HGB 15.7  HCT 45.2  PLT 210  MCV 84.5  MCH 29.3  MCHC 34.7  RDW 13.9   ------------------------------------------------------------------------------------------------------------------  Chemistries  Recent Labs  Lab 09/13/17 2106  NA 137  K 4.4  CL 106  CO2 22  GLUCOSE 104*  BUN 20  CREATININE 1.03  CALCIUM 9.0    ------------------------------------------------------------------------------------------------------------------ estimated creatinine clearance is 95.5 mL/min (by C-G formula based on SCr of 1.03 mg/dL). ------------------------------------------------------------------------------------------------------------------ No results for input(s): TSH, T4TOTAL, T3FREE, THYROIDAB in the last 72 hours.  Invalid input(s): FREET3   Coagulation profile Recent Labs  Lab 09/13/17 2313  INR 1.19   ------------------------------------------------------------------------------------------------------------------- No results for input(s): DDIMER in the last 72 hours. -------------------------------------------------------------------------------------------------------------------  Cardiac Enzymes Recent Labs  Lab 09/13/17 2106 09/13/17 2257  TROPONINI 1.08* 1.57*   ------------------------------------------------------------------------------------------------------------------ Invalid input(s): POCBNP  ---------------------------------------------------------------------------------------------------------------  Urinalysis    Component Value Date/Time   COLORURINE Yellow 05/12/2014 1017   APPEARANCEUR Clear 05/12/2014 1017   LABSPEC 1.016 05/12/2014 1017   PHURINE 7.0 05/12/2014 1017   GLUCOSEU Negative 05/12/2014 1017   HGBUR Negative 05/12/2014 1017   BILIRUBINUR Negative 05/12/2014 1017   KETONESUR Negative 05/12/2014 1017   PROTEINUR Negative 05/12/2014 1017   NITRITE Negative 05/12/2014 1017   LEUKOCYTESUR Negative 05/12/2014 1017     RADIOLOGY: Dg Chest 1 View  Result Date: 09/13/2017 CLINICAL DATA:  Shortness of breath and chest pain EXAM: CHEST 1 VIEW COMPARISON:  July 22, 2017 FINDINGS: There is no edema or consolidation. Heart is upper  normal in size with pulmonary vascularity within normal limits. No pneumothorax. No adenopathy. No bone lesions. IMPRESSION: No  edema or consolidation. Electronically Signed   By: Lowella Grip III M.D.   On: 09/13/2017 21:48   Ct Angio Chest Aorta W/cm &/or Wo/cm  Result Date: 09/13/2017 CLINICAL DATA:  Chest pain, cardiac etiology is suspected. Left arm numbness and tingling. Shortness of breath. Dizziness. EXAM: CT ANGIOGRAPHY CHEST WITH CONTRAST TECHNIQUE: Multidetector CT imaging of the chest was performed using the standard protocol during bolus administration of intravenous contrast. Multiplanar CT image reconstructions and MIPs were obtained to evaluate the vascular anatomy. CONTRAST:  183mL ISOVUE-370 IOPAMIDOL (ISOVUE-370) INJECTION 76% COMPARISON:  Chest radiograph earlier this day. FINDINGS: Cardiovascular: Thoracic aorta is normal in caliber. No dissection, aortic hematoma or aneurysm. No significant aortic atherosclerosis. Conventional branching pattern from the aortic arch. No filling defects in the central pulmonary arteries to suggest pulmonary embolus. There are coronary artery calcifications. No pericardial fluid. Mediastinum/Nodes: No enlarged mediastinal or hilar lymph nodes. The esophagus is decompressed, tiny hiatal hernia. Visualized thyroid gland is normal. Lungs/Pleura: Mild central bronchial thickening. Lower lobe atelectasis, right greater than left. No confluent consolidation. No pulmonary edema. No pleural fluid. 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 No acute abnormality. Musculoskeletal: Again seen bone island in the midthoracic vertebra. There are no acute or suspicious osseous abnormalities. Review of the MIP images confirms the above findings. IMPRESSION: 1. Mild central bronchial thickening. 2. No aortic dissection or acute aortic syndrome. 3. Coronary artery calcifications. Electronically Signed   By: Jeb Levering M.D.   On: 09/13/2017 22:21    EKG: Orders placed or performed during the hospital encounter of 09/13/17  . EKG  12-Lead  . EKG 12-Lead  . ED EKG within 10 minutes  . ED EKG within 10 minutes  . EKG 12-Lead  . EKG 12-Lead  . EKG 12-Lead  . EKG 12-Lead    IMPRESSION AND PLAN: 52 year old male patient with history of hypothermia hypothyroidism, sleep apnea presented to the emergency room with chest pain.  Admitting diagnosis 1.  Non-ST elevation MI 2.  Hypotension 3.  Hypothyroidism 4.  Sleep apnea Treatment plan Admit patient to telemetry Start patient on IV heparin drip Plavix 75 daily As needed morphine IV for chest pain IV fluids Cardiology consultation Cycle troponin and check echocardiogram CPAP at bedtime Patient seen and evaluated by me on 09/13/2017 All the records are reviewed and case discussed with ED provider. Management plans discussed with the patient, family and they are in agreement.  CODE STATUS:FULL CODE Code Status History    This patient does not have a recorded code status. Please follow your organizational policy for patients in this situation.       TOTAL TIME TAKING CARE OF THIS PATIENT: 50 minutes.    Saundra Shelling M.D on 09/14/2017 at 12:34 AM  Between 7am to 6pm - Pager - [phone]  After 6pm go to [email or web address] - password [email or web address] [facility]  Office  [phone]  CC: Primary care physician; Casilda Carls, MD

## 2017-09-14 NOTE — Progress Notes (Signed)
ANTICOAGULATION CONSULT NOTE - Initial Consult  Pharmacy Consult for heparin Indication: chest pain/ACS  Allergies  Allergen Reactions  . Aspirin Itching and Swelling  . Brussels Sprouts [Brassica Oleracea Italica] Swelling  . Other Swelling    OLIVES-SWELLING    Patient Measurements: Height: 5\' 7"  (170.2 cm) Weight: 216 lb 11.2 oz (98.3 kg) IBW/kg (Calculated) : 66.1 Heparin Dosing Weight: 86.4 kg  Vital Signs: Temp: 98 F (36.7 C) (01/03 0547) Temp Source: Oral (01/03 0547) BP: 102/69 (01/03 0547) Pulse Rate: 63 (01/03 0547)  Labs: Recent Labs    09/13/17 2106 09/13/17 2257 09/13/17 2313 09/14/17 0507  HGB 15.7  --   --  14.1  HCT 45.2  --   --  41.0  PLT 210  --   --  181  APTT  --   --  144*  --   LABPROT  --   --  15.0  --   INR  --   --  1.19  --   HEPARINUNFRC  --   --   --  0.24*  CREATININE 1.03  --   --  0.72  TROPONINI 1.08* 1.57*  --  2.38*    Estimated Creatinine Clearance: 124.8 mL/min (by C-G formula based on SCr of 0.72 mg/dL).   Medical History: Past Medical History:  Diagnosis Date  . Asthma   . Hypothermia   . Hypothyroidism   . Pneumonia 2016  . Sleep apnea    USES CPAP  . Wears contact lenses     Medications:  Scheduled:  . atorvastatin  80 mg Oral q1800  . heparin  1,300 Units Intravenous Once  . [START ON 09/15/2017] Influenza vac split quadrivalent PF  0.5 mL Intramuscular Tomorrow-1000  . levothyroxine  125 mcg Oral QAC breakfast  . multivitamin with minerals  1 tablet Oral Daily  . ondansetron        Assessment: Patient admitted for dizziness, numbness, tingling, in L arm w/ CP and trops @ 1.08 EKG showing sinus bradycardia w/o ST elevation Patient is being started on heparin for NSTEMI  Goal of Therapy:  Heparin level 0.3-0.7 units/ml Monitor platelets by anticoagulation protocol: Yes   Plan:  Will bolus heparin 4000 units IV x 1 Will start drip @ 1050 units/hr  Will draw HL @ 0500 Baseline labs drawn Will  monitor daily CBC's and adjust per HL's  0103 @ 0500 HL 0.24 subtherapeutic. Will rebolus w/ heparin 1300 units IV x 1 and increase rate to 1200 units/hr and will recheck HL @ 1100.  Tobie Lords, PharmD, BCPS Clinical Pharmacist 09/14/2017

## 2017-09-14 NOTE — Progress Notes (Signed)
ANTICOAGULATION CONSULT NOTE - Initial Consult  Pharmacy Consult for heparin Indication: chest pain/ACS  Allergies  Allergen Reactions  . Aspirin Itching and Swelling  . Brussels Sprouts [Brassica Oleracea Italica] Swelling  . Other Swelling    OLIVES-SWELLING  . Morphine And Related     Had tingling fingers,chest pain,tightness after dose.    Patient Measurements: Height: 5\' 7"  (170.2 cm) Weight: 216 lb (98 kg) IBW/kg (Calculated) : 66.1 Heparin Dosing Weight: 86.4 kg  Vital Signs: Temp: 98.1 F (36.7 C) (01/03 1217) Temp Source: Oral (01/03 1217) BP: 106/61 (01/03 1235) Pulse Rate: 63 (01/03 1217)  Labs: Recent Labs    09/13/17 2106 09/13/17 2257 09/13/17 2313 09/14/17 0507 09/14/17 1107  HGB 15.7  --   --  14.1  --   HCT 45.2  --   --  41.0  --   PLT 210  --   --  181  --   APTT  --   --  144*  --   --   LABPROT  --   --  15.0  --   --   INR  --   --  1.19  --   --   HEPARINUNFRC  --   --   --  0.24* 0.37  CREATININE 1.03  --   --  0.72  --   TROPONINI 1.08* 1.57*  --  2.38* 2.36*    Estimated Creatinine Clearance: 124.7 mL/min (by C-G formula based on SCr of 0.72 mg/dL).   Medical History: Past Medical History:  Diagnosis Date  . Asthma   . Hypothermia   . Hypothyroidism   . Pneumonia 2016  . Sleep apnea    USES CPAP  . Wears contact lenses     Medications:  Scheduled:  . [MAR Hold] atorvastatin  80 mg Oral q1800  . [START ON 09/15/2017] clopidogrel  75 mg Oral Pre-Cath  . [MAR Hold] Influenza vac split quadrivalent PF  0.5 mL Intramuscular Tomorrow-1000  . [MAR Hold] levothyroxine  125 mcg Oral QAC breakfast  . [MAR Hold] multivitamin with minerals  1 tablet Oral Daily  . nitroGLYCERIN      . sodium chloride flush  3 mL Intravenous Q12H    Assessment: Patient admitted for dizziness, numbness, tingling, in L arm w/ CP and trops @ 1.08 EKG showing sinus bradycardia w/o ST elevation Patient is being started on heparin for NSTEMI  Goal of  Therapy:  Heparin level 0.3-0.7 units/ml Monitor platelets by anticoagulation protocol: Yes   Plan:  Will bolus heparin 4000 units IV x 1 Will start drip @ 1050 units/hr  Will draw HL @ 0500 Baseline labs drawn Will monitor daily CBC's and adjust per HL's  0103 @ 0500 HL 0.24 subtherapeutic. Will rebolus w/ heparin 1300 units IV x 1 and increase rate to 1200 units/hr and will recheck HL @ 1100.  0103 @ HL resulted @ 0.37. Will continue current heparin gtt rate. Will recheck Heparin level @ 1700.   Larene Beach, PharmD  Clinical Pharmacist 09/14/2017

## 2017-09-15 ENCOUNTER — Inpatient Hospital Stay
Admit: 2017-09-15 | Discharge: 2017-09-15 | Disposition: A | Discharge status: Home / Self Care | Payer: BLUE CROSS/BLUE SHIELD | Attending: Cardiology | Admitting: Cardiology

## 2017-09-15 ENCOUNTER — Inpatient Hospital Stay: Admit: 2017-09-15 | Payer: BLUE CROSS/BLUE SHIELD

## 2017-09-15 LAB — BASIC METABOLIC PANEL
Anion gap: 6 (ref 5–15)
BUN: 14 mg/dL (ref 6–20)
CO2: 27 mmol/L (ref 22–32)
CREATININE: 0.79 mg/dL (ref 0.61–1.24)
Calcium: 8.5 mg/dL — ABNORMAL LOW (ref 8.9–10.3)
Chloride: 104 mmol/L (ref 101–111)
GFR calc Af Amer: >60 mL/min (ref >60)
GLUCOSE: 104 mg/dL — AB (ref 65–99)
Potassium: 4 mmol/L (ref 3.5–5.1)
Sodium: 137 mmol/L (ref 135–145)

## 2017-09-15 LAB — CBC
HCT: 42.3 % (ref 40.0–52.0)
Hemoglobin: 14.5 g/dL (ref 13.0–18.0)
MCH: 29 pg (ref 26.0–34.0)
MCHC: 34.2 g/dL (ref 32.0–36.0)
MCV: 84.8 fL (ref 80.0–100.0)
PLATELETS: 193 10*3/uL (ref 150–440)
RBC: 4.98 MIL/uL (ref 4.40–5.90)
RDW: 13.9 % (ref 11.5–14.5)
WBC: 5.3 10*3/uL (ref 3.8–10.6)

## 2017-09-15 LAB — HIV ANTIBODY (ROUTINE TESTING W REFLEX): HIV Screen 4th Generation wRfx: NONREACTIVE

## 2017-09-15 MED ORDER — METOPROLOL SUCCINATE ER 25 MG PO TB24: 25.0000 mg | ORAL_TABLET | Freq: Every day | ORAL | 1 refills | Status: DC

## 2017-09-15 MED ORDER — CLOPIDOGREL BISULFATE 75 MG PO TABS: 75.0000 mg | ORAL_TABLET | Freq: Every day | ORAL | 2 refills | Status: DC

## 2017-09-15 MED ORDER — METOPROLOL SUCCINATE ER 25 MG PO TB24
25.0000 mg | ORAL_TABLET | Freq: Every day | ORAL | Status: DC
  Administered 2017-09-15: 25 mg via ORAL
  Filled 2017-09-15: qty 1

## 2017-09-15 MED ORDER — ATORVASTATIN CALCIUM 80 MG PO TABS: 80.0000 mg | ORAL_TABLET | Freq: Every day | ORAL | 1 refills | Status: DC

## 2017-09-15 NOTE — Plan of Care (Signed)
Nutrition Education Note  RD consulted for nutrition education regarding a Heart Healthy diet.   Lipid Panel     Component Value Date/Time   CHOL 188 09/14/2017 0507   TRIG 225 (H) 09/14/2017 0507   HDL 36 (L) 09/14/2017 0507   CHOLHDL 5.2 09/14/2017 0507   VLDL 45 (H) 09/14/2017 0507   LDLCALC 107 (H) 09/14/2017 0507   Spoke with patient, wife at bedside. He reports eating keto diet PTA. States he has been losing weight doing that. Encouraged him to eat a regular diet, involved plate method, encouraged fiber intake provided fiber content of foods handout as well. Answered many questions.  RD provided "Heart Healthy Nutrition Therapy" handout from the Academy of [employer]. Reviewed patient's dietary recall. Provided examples on ways to decrease sodium and fat intake in diet. Discouraged intake of processed foods and use of salt shaker. Encouraged fresh fruits and vegetables as well as whole grain sources of carbohydrates to maximize fiber intake. Teach back method used.  Expect fair compliance.  Body mass index is 33.83 kg/m. Pt meets criteria for obese class I based on current BMI.  Current diet order is heart healthy, patient is consuming approximately 100% of meals at this time. Labs and medications reviewed. No further nutrition interventions warranted at this time. RD contact information provided. If additional nutrition issues arise, please re-consult RD.  Satira Anis. Nakyla Bracco, MS, RD LDN Inpatient Clinical Dietitian Pager [phone]

## 2017-09-15 NOTE — Care Management (Signed)
No discharge needs identified by members of the care team 

## 2017-09-15 NOTE — Progress Notes (Signed)
*  PRELIMINARY RESULTS* Echocardiogram 2D Echocardiogram has been performed.  Erik Bush 09/15/2017, 9:18 AM

## 2017-09-15 NOTE — Progress Notes (Signed)
Patient admitted with dx of NSTEMI.  Patient s/p Cardiac Cath with PCI/DES x 2 to mid/proximal RCA.  PMH:  Asthma, hypothyroidism, Pna, Sleep Apnea - Uses CPAP at home.   Patient is a former smoker.    Patient walking around room with wife at bedside.   ? "Heart Attack Bouncing Back" booklet given and reviewed with patient and wife. Discussed the definition of CAD. Reviewed the location CAD and where stent was placed. Explained to patient about the stent card. Explained the purpose of the stent card. Instructed patient to keep stent card in her wallet.  ? Discussed modifiable risk factors including controlling blood pressure, cholesterol, and blood sugar; following heart healthy diet; maintaining healthy weight; exercise; and smoking cessation, if applicable. ?Note: Patient is a former smoker.  ? Discussed cardiac medications including rationale for taking, mechanisms of action, and side effects. Stressed the importance of taking medications as prescribed. Patient being discharged on Plavix, metoprolol, Lipitor.  Patient is allergic to ASA.  Echo pending.   ? Discussed emergency plan for heart attack symptoms. Patient verbalized understanding of need to call 911 and not to drive herself to ER if having cardiac symptoms / chest pain.  ? Heart healthy diet of low sodium, low fat, low cholesterol heart healthy diet discussed. ?Information on diet provided.  Smoking Cessation - Patient is a former smoker.??? ? Exercise - Patient does not currently exercise, but has a job which requires a lot of physical activity (Walking). Patient informed this RN that he walks up to 32,000 steps per day.   The benefits of exercise were discussed.  Explained to patient he had been referred to the Cardiac Rehab program here at [facility].   Patient interested in participating but will need to check with his insurance to see what his co-pay would be.  Also patient needs to make sure he will be able to leave work in the  afternoon in order to attend.?Cardiac Rehab brochure with department phone number, informational sheet about Cardiac Rehab, and CPT billing codes provided to patient.  Explained to patient that the [facility] Cardiac Rehab administrative assistant will be calling to schedule an appointment for Cardiac Rehab. In the meantime, walking was encouraged. Instructed patient to slowly increase and build up to 30 minutes of walking per day for a total of 150 minutes per week.  ? Patient and wife thanked me for the above information.  ? Roanna Epley, RN, BSN, [facility] Cardiovascular and Pulmonary Nurse Navigator ??

## 2017-09-15 NOTE — Discharge Summary (Signed)
[facility] NAME: Erik Bush    MR#:  [id number]  DATE OF BIRTH:  Dec 03, 1965  DATE OF ADMISSION:  09/13/2017 ADMITTING PHYSICIAN: Saundra Shelling, MD  DATE OF DISCHARGE: 09/15/2017  PRIMARY CARE PHYSICIAN: Casilda Carls, MD    ADMISSION DIAGNOSIS:  Shortness of breath [R06.02] Dizziness [R42] NSTEMI (non-ST elevated myocardial infarction) ([facility]) [I21.4] Chest pain [R07.9] Non-STEMI (non-ST elevated myocardial infarction) ([place]) [I21.4]  DISCHARGE DIAGNOSIS:  Acute NSTEMI s/p PCI with 2 drug-eluting stents overlapping each other in RCA that was 100% occluded Relative hypotension--asymptomatic SECONDARY DIAGNOSIS:   Past Medical History:  Diagnosis Date  . Asthma   . Hypothermia   . Hypothyroidism   . Pneumonia 2016  . Sleep apnea    USES CPAP  . Wears contact lenses     HOSPITAL COURSE:  JavierToungateis a51 y.o.malewith a known history of hypothyroidism,  bronchial asthma, sleep apnea presented to the emergency room with chest pain of 48-72 hours duration.Patient also had nausea but no vomiting occasional diaphoresis and radiation of the pain to the back.The patient is allergic to aspirin.EKG did not reveal any STEMI.  1.Acute non-Q wave MI -Patient came in with persistent chest pain.  EKG did not reveal any ST elevation MI. -Troponin were 1.08--1.57--2.38--2.36 -Patient was started on IV heparin drip--- now discontinued -Continue aspirin, Plavix, statins and beta-blockers -Cardiology input and help appreciated.  Patient is status post cardiac catheterization with PCI and stenting with drug-eluting stent x2 overlapping each other in 100% blocked RCA -Echo today -Blood pressure relatively low.  Unable to add ACE inhibitor. -Cardiac rehab order placed by cardiology -Dietitian consultation  2.  Hyperlipidemia -Continue statins  3.  Hypothyroidism continue Synthroid  4.  Asthma -Stable.  Oxygen  as needed  Discussed with patient and wife Ambulate patient in the hallways after breakfast. If remains hemodynamically stable condition  D/w Dr Josefa Half  CONSULTS OBTAINED:  Treatment Team:  Isaias Cowman, MD  DRUG ALLERGIES:   Allergies  Allergen Reactions  . Aspirin Itching and Swelling  . Brussels Sprouts [Brassica Oleracea Italica] Swelling  . Other Swelling    OLIVES-SWELLING  . Morphine And Related     Had tingling fingers,chest pain,tightness after dose.    DISCHARGE MEDICATIONS:   Allergies as of 09/15/2017      Reactions   Aspirin Itching, Swelling   Brussels Sprouts [brassica Oleracea Italica] Swelling   Other Swelling   OLIVES-SWELLING   Morphine And Related    Had tingling fingers,chest pain,tightness after dose.      Medication List    STOP taking these medications   amoxicillin-clavulanate 875-125 MG tablet Commonly known as:  AUGMENTIN     TAKE these medications   atorvastatin 80 MG tablet Commonly known as:  LIPITOR Take 1 tablet (80 mg total) by mouth daily at 6 PM.   clopidogrel 75 MG tablet Commonly known as:  PLAVIX Take 1 tablet (75 mg total) by mouth daily with breakfast. Start taking on:  09/16/2017   levothyroxine 125 MCG tablet Commonly known as:  SYNTHROID, LEVOTHROID Take 125 mcg by mouth daily before breakfast.   metoprolol succinate 25 MG 24 hr tablet Commonly known as:  TOPROL-XL Take 1 tablet (25 mg total) by mouth daily.   multivitamin with minerals tablet Take 1 tablet by mouth daily.       If you experience worsening of your admission symptoms, develop shortness of breath, life threatening emergency, suicidal or homicidal thoughts you  must seek medical attention immediately by calling 911 or calling your MD immediately  if symptoms less severe.  You Must read complete instructions/literature along with all the possible adverse reactions/side effects for all the Medicines you take and that have been prescribed  to you. Take any new Medicines after you have completely understood and accept all the possible adverse reactions/side effects.   Please note  You were cared for by a hospitalist during your hospital stay. If you have any questions about your discharge medications or the care you received while you were in the hospital after you are discharged, you can call the unit and asked to speak with the hospitalist on call if the hospitalist that took care of you is not available. Once you are discharged, your primary care physician will handle any further medical issues. Please note that NO REFILLS for any discharge medications will be authorized once you are discharged, as it is imperative that you return to your primary care physician (or establish a relationship with a primary care physician if you do not have one) for your aftercare needs so that they can reassess your need for medications and monitor your lab values. Today   SUBJECTIVE   Patient out of the chair eating breakfast.  Mild shortness of breath.  No chest pain.  Wife in the room.  VITAL SIGNS:  Blood pressure (!) 100/58, pulse 71, temperature 97.9 F (36.6 C), temperature source Oral, resp. rate 18, height 5\' 7"  (1.702 m), weight 98 kg (216 lb), SpO2 97 %.  I/O:    Intake/Output Summary (Last 24 hours) at 09/15/2017 0844 Last data filed at 09/15/2017 0444 Gross per 24 hour  Intake 2010.73 ml  Output 500 ml  Net 1510.73 ml    PHYSICAL EXAMINATION:  GENERAL:  52 y.o.-year-old patient lying in the bed with no acute distress.  EYES: Pupils equal, round, reactive to light and accommodation. No scleral icterus. Extraocular muscles intact.  HEENT: Head atraumatic, normocephalic. Oropharynx and nasopharynx clear.  NECK:  Supple, no jugular venous distention. No thyroid enlargement, no tenderness.  LUNGS: Normal breath sounds bilaterally, no wheezing, rales,rhonchi or crepitation. No use of accessory muscles of respiration.  CARDIOVASCULAR:  S1, S2 normal. No murmurs, rubs, or gallops.  ABDOMEN: Soft, non-tender, non-distended. Bowel sounds present. No organomegaly or mass.  EXTREMITIES: No pedal edema, cyanosis, or clubbing.  NEUROLOGIC: Cranial nerves II through XII are intact. Muscle strength 5/5 in all extremities. Sensation intact. Gait not checked.  PSYCHIATRIC: The patient is alert and oriented x 3.  SKIN: No obvious rash, lesion, or ulcer.   DATA REVIEW:   CBC  Recent Labs  Lab 09/15/17 0405  WBC 5.3  HGB 14.5  HCT 42.3  PLT 193    Chemistries  Recent Labs  Lab 09/15/17 0405  NA 137  K 4.0  CL 104  CO2 27  GLUCOSE 104*  BUN 14  CREATININE 0.79  CALCIUM 8.5*    Microbiology Results   No results found for this or any previous visit (from the past 240 hour(s)).  RADIOLOGY:  Dg Chest 1 View  Result Date: 09/13/2017 CLINICAL DATA:  Shortness of breath and chest pain EXAM: CHEST 1 VIEW COMPARISON:  July 22, 2017 FINDINGS: There is no edema or consolidation. Heart is upper normal in size with pulmonary vascularity within normal limits. No pneumothorax. No adenopathy. No bone lesions. IMPRESSION: No edema or consolidation. Electronically Signed   By: Lowella Grip III M.D.   On: 09/13/2017 21:48  Ct Angio Chest Aorta W/cm &/or Wo/cm  Result Date: 09/13/2017 CLINICAL DATA:  Chest pain, cardiac etiology is suspected. Left arm numbness and tingling. Shortness of breath. Dizziness. EXAM: CT ANGIOGRAPHY CHEST WITH CONTRAST TECHNIQUE: Multidetector CT imaging of the chest was performed using the standard protocol during bolus administration of intravenous contrast. Multiplanar CT image reconstructions and MIPs were obtained to evaluate the vascular anatomy. CONTRAST:  137mL ISOVUE-370 IOPAMIDOL (ISOVUE-370) INJECTION 76% COMPARISON:  Chest radiograph earlier this day. FINDINGS: Cardiovascular: Thoracic aorta is normal in caliber. No dissection, aortic hematoma or aneurysm. No significant aortic  atherosclerosis. Conventional branching pattern from the aortic arch. No filling defects in the central pulmonary arteries to suggest pulmonary embolus. There are coronary artery calcifications. No pericardial fluid. Mediastinum/Nodes: No enlarged mediastinal or hilar lymph nodes. The esophagus is decompressed, tiny hiatal hernia. Visualized thyroid gland is normal. Lungs/Pleura: Mild central bronchial thickening. Lower lobe atelectasis, right greater than left. No confluent consolidation. No pulmonary edema. No pleural fluid. 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 No acute abnormality. Musculoskeletal: Again seen bone island in the midthoracic vertebra. There are no acute or suspicious osseous abnormalities. Review of the MIP images confirms the above findings. IMPRESSION: 1. Mild central bronchial thickening. 2. No aortic dissection or acute aortic syndrome. 3. Coronary artery calcifications. Electronically Signed   By: Jeb Levering M.D.   On: 09/13/2017 22:21     Management plans discussed with the patient, family and they are in agreement.  CODE STATUS:     Code Status Orders  (From admission, onward)        Start     Ordered   09/14/17 0127  Full code  Continuous     09/14/17 0126    Code Status History    Date Active Date Inactive Code Status Order ID Comments User Context   This patient has a current code status but no historical code status.      TOTAL TIME TAKING CARE OF THIS PATIENT: 40 minutes.    Fritzi Mandes M.D on 09/15/2017 at 8:44 AM  Between 7am to 6pm - Pager - [phone] After 6pm go to [email or web address] - password [email or web address] [facility]  Office  [phone]  CC: Primary care physician; Casilda Carls, MD

## 2017-09-15 NOTE — Progress Notes (Addendum)
Discharge instructions explained to pt and pts spouse/ verbalized an understanding/ work noted provided /iv and tele removed/ will transport off unit via wheelchair.

## 2017-09-15 NOTE — Progress Notes (Signed)
[facility] Cardiology  SUBJECTIVE: Patient sitting at bedside, eating breakfast, feels much better, denies chest pain   Vitals:   09/14/17 1948 09/14/17 2146 09/15/17 0538 09/15/17 0803  BP: 112/65  100/63 (!) 100/58  Pulse: 83  72 (!) 59  Resp: 18  18 18   Temp: 98 F (36.7 C)  98.2 F (36.8 C) 97.9 F (36.6 C)  TempSrc: Oral  Oral Oral  SpO2: 94% 95% 97% 97%  Weight:      Height:         Intake/Output Summary (Last 24 hours) at 09/15/2017 3903 Last data filed at 09/15/2017 0444 Gross per 24 hour  Intake 2010.73 ml  Output 500 ml  Net 1510.73 ml      PHYSICAL EXAM  General: Well developed, well nourished, in no acute distress HEENT:  Normocephalic and atramatic Neck:  No JVD.  Lungs: Clear bilaterally to auscultation and percussion. Heart: HRRR . Normal S1 and S2 without gallops or murmurs.  Abdomen: Bowel sounds are positive, abdomen soft and non-tender  Msk:  Back normal, normal gait. Normal strength and tone for age. Extremities: No clubbing, cyanosis or edema.   Neuro: Alert and oriented X 3. Psych:  Good affect, responds appropriately   LABS: Basic Metabolic Panel: Recent Labs    09/14/17 0507 09/15/17 0405  NA 138 137  K 4.0 4.0  CL 108 104  CO2 24 27  GLUCOSE 96 104*  BUN 14 14  CREATININE 0.72 0.79  CALCIUM 8.3* 8.5*   Liver Function Tests: No results for input(s): AST, ALT, ALKPHOS, BILITOT, PROT, ALBUMIN in the last 72 hours. No results for input(s): LIPASE, AMYLASE in the last 72 hours. CBC: Recent Labs    09/14/17 0507 09/15/17 0405  WBC 5.7 5.3  HGB 14.1 14.5  HCT 41.0 42.3  MCV 85.9 84.8  PLT 181 193   Cardiac Enzymes: Recent Labs    09/14/17 0507 09/14/17 1107 09/14/17 1732  TROPONINI 2.38* 2.36* 2.05*   BNP: Invalid input(s): POCBNP D-Dimer: No results for input(s): DDIMER in the last 72 hours. Hemoglobin A1C: No results for input(s): HGBA1C in the last 72 hours. Fasting Lipid Panel: Recent Labs    09/14/17 0507  CHOL 188   HDL 36*  LDLCALC 107*  TRIG 225*  CHOLHDL 5.2   Thyroid Function Tests: No results for input(s): TSH, T4TOTAL, T3FREE, THYROIDAB in the last 72 hours.  Invalid input(s): FREET3 Anemia Panel: No results for input(s): VITAMINB12, FOLATE, FERRITIN, TIBC, IRON, RETICCTPCT in the last 72 hours.  Dg Chest 1 View  Result Date: 09/13/2017 CLINICAL DATA:  Shortness of breath and chest pain EXAM: CHEST 1 VIEW COMPARISON:  July 22, 2017 FINDINGS: There is no edema or consolidation. Heart is upper normal in size with pulmonary vascularity within normal limits. No pneumothorax. No adenopathy. No bone lesions. IMPRESSION: No edema or consolidation. Electronically Signed   By: Lowella Grip III M.D.   On: 09/13/2017 21:48   Ct Angio Chest Aorta W/cm &/or Wo/cm  Result Date: 09/13/2017 CLINICAL DATA:  Chest pain, cardiac etiology is suspected. Left arm numbness and tingling. Shortness of breath. Dizziness. EXAM: CT ANGIOGRAPHY CHEST WITH CONTRAST TECHNIQUE: Multidetector CT imaging of the chest was performed using the standard protocol during bolus administration of intravenous contrast. Multiplanar CT image reconstructions and MIPs were obtained to evaluate the vascular anatomy. CONTRAST:  163mL ISOVUE-370 IOPAMIDOL (ISOVUE-370) INJECTION 76% COMPARISON:  Chest radiograph earlier this day. FINDINGS: Cardiovascular: Thoracic aorta is normal in caliber. No dissection, aortic  hematoma or aneurysm. No significant aortic atherosclerosis. Conventional branching pattern from the aortic arch. No filling defects in the central pulmonary arteries to suggest pulmonary embolus. There are coronary artery calcifications. No pericardial fluid. Mediastinum/Nodes: No enlarged mediastinal or hilar lymph nodes. The esophagus is decompressed, tiny hiatal hernia. Visualized thyroid gland is normal. Lungs/Pleura: Mild central bronchial thickening. Lower lobe atelectasis, right greater than left. No confluent consolidation. No  pulmonary edema. No pleural fluid. 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 No acute abnormality. Musculoskeletal: Again seen bone island in the midthoracic vertebra. There are no acute or suspicious osseous abnormalities. Review of the MIP images confirms the above findings. IMPRESSION: 1. Mild central bronchial thickening. 2. No aortic dissection or acute aortic syndrome. 3. Coronary artery calcifications. Electronically Signed   By: Jeb Levering M.D.   On: 09/13/2017 22:21     Echo pending  TELEMETRY: Normal sinus rhythm:  ASSESSMENT AND PLAN:  Active Problems:   Non-STEMI (non-ST elevated myocardial infarction) ([place])    1.  Non-STEMI, occluded proximal RCA, status post overlapping DES with excellent angiographic result 2.  Mild cardiomyopathy, with inferior wall hypokinesis  Recommendations  1.  Continue current therapy 2.  Continue dual antiplatelet therapy uninterrupted for 1 year 3.  Agree with high-dose atorvastatin for secondary prevention 4.  Start low-dose metoprolol succinate 25 mg daily 5.  Review 2D echocardiogram 6.  Ambulate today 7.  Enroll in cardiac rehab following discharge    Isaias Cowman, MD, PhD, [facility] 09/15/2017 8:33 AM

## 2017-09-16 LAB — ECHOCARDIOGRAM COMPLETE
Height: 67 in
Weight: 3456 oz

## 2017-09-17 ENCOUNTER — Emergency Department: Payer: BLUE CROSS/BLUE SHIELD

## 2017-09-17 ENCOUNTER — Other Ambulatory Visit: Payer: Self-pay

## 2017-09-17 ENCOUNTER — Emergency Department
Admission: EM | Admit: 2017-09-17 | Discharge: 2017-09-17 | Disposition: A | Discharge status: Home / Self Care | Payer: BLUE CROSS/BLUE SHIELD | Attending: Emergency Medicine | Admitting: Emergency Medicine

## 2017-09-17 ENCOUNTER — Encounter: Payer: Self-pay | Admitting: Emergency Medicine

## 2017-09-17 DIAGNOSIS — Z87891 Personal history of nicotine dependence: Secondary | ICD-10-CM | POA: Insufficient documentation

## 2017-09-17 DIAGNOSIS — Z79899 Other long term (current) drug therapy: Secondary | ICD-10-CM | POA: Diagnosis not present

## 2017-09-17 DIAGNOSIS — I252 Old myocardial infarction: Secondary | ICD-10-CM | POA: Insufficient documentation

## 2017-09-17 DIAGNOSIS — N50811 Right testicular pain: Secondary | ICD-10-CM | POA: Diagnosis not present

## 2017-09-17 DIAGNOSIS — J45909 Unspecified asthma, uncomplicated: Secondary | ICD-10-CM | POA: Diagnosis not present

## 2017-09-17 DIAGNOSIS — E039 Hypothyroidism, unspecified: Secondary | ICD-10-CM | POA: Diagnosis not present

## 2017-09-17 DIAGNOSIS — I2510 Atherosclerotic heart disease of native coronary artery without angina pectoris: Secondary | ICD-10-CM | POA: Insufficient documentation

## 2017-09-17 DIAGNOSIS — Z7902 Long term (current) use of antithrombotics/antiplatelets: Secondary | ICD-10-CM | POA: Diagnosis not present

## 2017-09-17 DIAGNOSIS — N50819 Testicular pain, unspecified: Secondary | ICD-10-CM

## 2017-09-17 HISTORY — DX: Atherosclerotic heart disease of native coronary artery without angina pectoris: I25.10

## 2017-09-17 NOTE — ED Provider Notes (Signed)
[facility] Emergency Department Provider Note  ____________________________________________   First MD Initiated Contact with Patient 09/17/17 1548     (approximate)  I have reviewed the triage vital signs and the nursing notes.   HISTORY  Chief Complaint Testicle Pain   HPI Erik Bush is a 52 y.o. male with a history of a recent non-ST elevation MI with heart catheterization and stenting on 3 January who is presenting to the emergency department today with discomfort and elevation of his right testicle.  He says that ever since he had a catheterization he has noticed progressive raising discomfort to his right testicle.  However, he denies any burning with urination or difficulty with urination.  He says that the pain is worse when seated.  Does not note any swelling.  Says that there was no sudden onset of the pain.  Patient denies any chest pain or shortness of breath since having the catheterization procedure.   Past Medical History:  Diagnosis Date  . Asthma   . Coronary artery disease   . Hypothermia   . Hypothyroidism   . Pneumonia 2016  . Sleep apnea    USES CPAP  . Wears contact lenses     Patient Active Problem List   Diagnosis Date Noted  . Non-STEMI (non-ST elevated myocardial infarction) ([place]) 09/13/2017  . Neck mass     Past Surgical History:  Procedure Laterality Date  . ANAL FISTULOTOMY N/A 06/08/2015   Procedure: ANAL FISTULOTOMY;  Surgeon: Christene Lye, MD;  Location: [facility] ORS;  Service: General;  Laterality: N/A;  . ANTERIOR CRUCIATE LIGAMENT REPAIR     knees  . CLOSED REDUCTION NASAL FRACTURE N/A 03/22/2016   Procedure: CLOSED REDUCTION NASAL FRACTURE;  Surgeon: Clyde Canterbury, MD;  Location: [facility];  Service: ENT;  Laterality: N/A;  sleep apnea  . LEFT HEART CATH AND CORONARY ANGIOGRAPHY N/A 09/14/2017   Procedure: LEFT HEART CATH AND CORONARY ANGIOGRAPHY and possible PCI;  Surgeon: Isaias Cowman, MD;  Location: [facility];  Service: Cardiovascular;  Laterality: N/A;  . LIPOMA EXCISION N/A 04/05/2017   Procedure: EXCISION LIPOMA Nuchal area.  prone position;  Surgeon: Jules Husbands, MD;  Location: [facility] ORS;  Service: General;  Laterality: N/A;  Prone  . MEDIAL COLLATERAL LIGAMENT REPAIR, KNEE    . SHOULDER ARTHROSCOPY Right     Prior to Admission medications   Medication Sig Start Date End Date Taking? Authorizing Provider  atorvastatin (LIPITOR) 80 MG tablet Take 1 tablet (80 mg total) by mouth daily at 6 PM. 09/15/17   Fritzi Mandes, MD  clopidogrel (PLAVIX) 75 MG tablet Take 1 tablet (75 mg total) by mouth daily with breakfast. 09/16/17   Fritzi Mandes, MD  levothyroxine (SYNTHROID, LEVOTHROID) 125 MCG tablet Take 125 mcg by mouth daily before breakfast.    [provider]  metoprolol succinate (TOPROL-XL) 25 MG 24 hr tablet Take 1 tablet (25 mg total) by mouth daily. 09/15/17   Fritzi Mandes, MD  Multiple Vitamins-Minerals (MULTIVITAMIN WITH MINERALS) tablet Take 1 tablet by mouth daily.    [provider]    Allergies Aspirin; Brussels sprouts [brassica oleracea italica]; Other; and Morphine and related  Family History  Problem Relation Age of Onset  . Thyroid disease Father   . Heart attack Unknown     Social History Social History   Tobacco Use  . Smoking status: Former Smoker    Packs/day: 2.00    Years: 6.00  Pack years: 12.00    Types: Cigarettes    Last attempt to quit: 09/12/1989    Years since quitting: 28.0  . Smokeless tobacco: Never Used  Substance Use Topics  . Alcohol use: Yes    Alcohol/week: 0.0 oz    Comment: occassionally  . Drug use: No    Review of Systems  Constitutional: No fever/chills Eyes: No visual changes. ENT: No sore throat. Cardiovascular: Denies chest pain. Respiratory: Denies shortness of breath. Gastrointestinal: No abdominal pain.  No nausea, no vomiting.  No diarrhea.  No  constipation. Genitourinary: As above Musculoskeletal: Negative for back pain. Skin: Negative for rash. Neurological: Negative for headaches, focal weakness or numbness.   ____________________________________________   PHYSICAL EXAM:  VITAL SIGNS: ED Triage Vitals  Enc Vitals Group     BP 09/17/17 1431 114/62     Pulse Rate 09/17/17 1431 83     Resp 09/17/17 1431 16     Temp 09/17/17 1431 98.3 F (36.8 C)     Temp Source 09/17/17 1431 Oral     SpO2 09/17/17 1431 97 %     Weight 09/17/17 1430 216 lb (98 kg)     Height 09/17/17 1430 5\' 7"  (1.702 m)     Head Circumference --      Peak Flow --      Pain Score 09/17/17 1430 0     Pain Loc --      Pain Edu? --      Excl. in [place]? --     Constitutional: Alert and oriented. Well appearing and in no acute distress. Eyes: Conjunctivae are normal.  Head: Atraumatic. Nose: No congestion/rhinnorhea. Mouth/Throat: Mucous membranes are moist.  Neck: No stridor.   Cardiovascular: Normal rate, regular rhythm. Grossly normal heart sounds. Respiratory: Normal respiratory effort.  No retractions. Lungs CTAB. Gastrointestinal: Soft and nontender. No distention. No CVA tenderness. Genitourinary: Gross appearance of this uncircumcised male is with no abnormal masses to the genitalia but the right testicle is slightly raised left.  There is no tenderness to palpation of the testicle nor is there tenderness palpation of the epididymis.  No masses palpated.  I am able to lower the right testicle to where it is even with the left without the patient having pain.  No erythema or induration to the scrotum. No hernia sac palpated to the inguinal region nor the scrotum.  Catheterization site to the right groin is with minimal ecchymosis but without any pulsatile mass or noted swelling.  Musculoskeletal: No lower extremity tenderness nor edema.  No joint effusions. Neurologic:  Normal speech and language. No gross focal neurologic deficits are  appreciated. Skin:  Skin is warm, dry and intact. No rash noted. Psychiatric: Mood and affect are normal. Speech and behavior are normal.  ____________________________________________   LABS (all labs ordered are listed, but only abnormal results are displayed)  Labs Reviewed - No data to display ____________________________________________  EKG   ____________________________________________  RADIOLOGY  Small bilateral epididymal cysts with additional small cysts in the right testes.  Otherwise negative exam. ____________________________________________   PROCEDURES  Procedure(s) performed:   Procedures  Critical Care performed:   ____________________________________________   INITIAL IMPRESSION / ASSESSMENT AND PLAN / ED COURSE  Pertinent labs & imaging results that were available during my care of the patient were reviewed by me and considered in my medical decision making (see chart for details).  DDX: Epididymitis, testicular torsion, varicocele, hydrocele, hernia, localized inflammation. As part of my medical decision making, I  reviewed the following data within the [facility] Notes from prior ED visits  Patient with elevated right testicle but otherwise with very benign exam without any palpated.  Possible local inflammatory response from the catheterization causing the appearance of/elevation of the right testicle.  I discussed the cysts found on the ultrasound and the possibility that this could be caused by localized inflammation after the catheterization.  However, there do not appear to be any emergent cause of the patient's symptoms at this time requiring emergent urologic intervention at this time.  I discussed urology follow-up with the patient and he is understanding and willing to comply.     ____________________________________________   FINAL CLINICAL IMPRESSION(S) / ED DIAGNOSES  Final diagnoses:  Testicular pain      NEW  MEDICATIONS STARTED DURING THIS VISIT:  This SmartLink is deprecated. Use AVSMEDLIST instead to display the medication list for a patient.   Note:  This document was prepared using Dragon voice recognition software and may include unintentional dictation errors.     Orbie Pyo, MD 09/17/17 [phone]

## 2017-09-17 NOTE — ED Triage Notes (Signed)
Arrives with c/o right testicle being "higher" than left.  Onset of symptoms 3 days (12/3 onset).  Patient had 2 cardiac stents placed on 12/3 via right femoral artery.  States symptoms have worsened.  Denies difficulty urinating.  States sensation is "uncomforable".Marland Kitchen

## 2017-09-25 ENCOUNTER — Encounter: Payer: BLUE CROSS/BLUE SHIELD | Attending: Cardiology | Admitting: *Deleted

## 2017-09-25 VITALS — Ht 67.5 in | Wt 210.8 lb

## 2017-09-25 DIAGNOSIS — Z955 Presence of coronary angioplasty implant and graft: Secondary | ICD-10-CM | POA: Diagnosis not present

## 2017-09-25 DIAGNOSIS — I214 Non-ST elevation (NSTEMI) myocardial infarction: Secondary | ICD-10-CM | POA: Insufficient documentation

## 2017-09-25 DIAGNOSIS — Z48812 Encounter for surgical aftercare following surgery on the circulatory system: Secondary | ICD-10-CM | POA: Insufficient documentation

## 2017-09-25 NOTE — Patient Instructions (Signed)
Patient Instructions  Patient Details  Name: Erik Bush MRN: [id number] Date of Birth: 03/26/1966 Referring Provider:  Isaias Cowman, MD  Below are the personal goals you chose as well as exercise and nutrition goals. Our goal is to help you keep on track towards obtaining and maintaining your goals. We will be discussing your progress on these goals with you throughout the program.  Initial Exercise Prescription: Initial Exercise Prescription - 09/25/17 1400      Date of Initial Exercise RX and Referring Provider   Date  09/25/17    Referring Provider  Paraschos, Alexander MD Attending: Lujean Amel, MD      Treadmill   MPH  2.4    Grade  1    Minutes  15    METs  3.17      Recumbant Elliptical   Level  2    RPM  50    Minutes  15    METs  3.1      Elliptical   Level  1    Speed  2.4    Minutes  15      Prescription Details   Frequency (times per week)  3    Duration  Progress to 45 minutes of aerobic exercise without signs/symptoms of physical distress      Intensity   THRR 40-80% of Max Heartrate  108-150    Ratings of Perceived Exertion  11-13    Perceived Dyspnea  0-4      Progression   Progression  Continue to progress workloads to maintain intensity without signs/symptoms of physical distress.      Resistance Training   Training Prescription  Yes    Weight  4 lbs    Reps  10-15       Exercise Goals: Frequency: Be able to perform aerobic exercise three times per week working toward 3-5 days per week.  Intensity: Work with a perceived exertion of 11 (fairly light) - 15 (hard) as tolerated. Follow your new exercise prescription and watch for changes in prescription as you progress with the program. Changes will be reviewed with you when they are made.  Duration: You should be able to do 30 minutes of continuous aerobic exercise in addition to a 5 minute warm-up and a 5 minute cool-down routine.  Nutrition Goals: Your personal nutrition  goals will be established when you do your nutrition analysis with the dietician.  The following are nutrition guidelines to follow: Cholesterol < 200mg /day Sodium < 1500mg /day Fiber: Men under 50 yrs - 38 grams per day  Personal Goals: Personal Goals and Risk Factors at Admission - 09/25/17 1114      Core Components/Risk Factors/Patient Goals on Admission    Weight Management  Yes;Obesity;Weight Loss    Intervention  Weight Management: Develop a combined nutrition and exercise program designed to reach desired caloric intake, while maintaining appropriate intake of nutrient and fiber, sodium and fats, and appropriate energy expenditure required for the weight goal.;Weight Management: Provide education and appropriate resources to help participant work on and attain dietary goals.;Weight Management/Obesity: Establish reasonable short term and long term weight goals.;Obesity: Provide education and appropriate resources to help participant work on and attain dietary goals.    Admit Weight  210 lb (95.3 kg)    Goal Weight: Short Term  205 lb (93 kg)    Goal Weight: Long Term  180 lb (81.6 kg)    Expected Outcomes  Short Term: Continue to assess and modify interventions until short term  weight is achieved;Long Term: Adherence to nutrition and physical activity/exercise program aimed toward attainment of established weight goal;Weight Loss: Understanding of general recommendations for a balanced deficit meal plan, which promotes 1-2 lb weight loss per week and includes a negative energy balance of [phone] kcal/d;Understanding recommendations for meals to include 15-35% energy as protein, 25-35% energy from fat, 35-60% energy from carbohydrates, less than 200mg  of dietary cholesterol, 20-35 gm of total fiber daily;Understanding of distribution of calorie intake throughout the day with the consumption of 4-5 meals/snacks    Lipids  Yes    Intervention  Provide education and support for participant on  nutrition & aerobic/resistive exercise along with prescribed medications to achieve LDL 70mg , HDL >40mg .    Expected Outcomes  Short Term: Participant states understanding of desired cholesterol values and is compliant with medications prescribed. Participant is following exercise prescription and nutrition guidelines.;Long Term: Cholesterol controlled with medications as prescribed, with individualized exercise RX and with personalized nutrition plan. Value goals: LDL < 70mg , HDL > 40 mg.    Stress  Yes Job stress    Intervention  Offer individual and/or small group education and counseling on adjustment to heart disease, stress management and health-related lifestyle change. Teach and support self-help strategies.;Refer participants experiencing significant psychosocial distress to appropriate mental health specialists for further evaluation and treatment. When possible, include family members and significant others in education/counseling sessions.    Expected Outcomes  Short Term: Participant demonstrates changes in health-related behavior, relaxation and other stress management skills, ability to obtain effective social support, and compliance with psychotropic medications if prescribed.;Long Term: Emotional wellbeing is indicated by absence of clinically significant psychosocial distress or social isolation.       Tobacco Use Initial Evaluation: Social History   Tobacco Use  Smoking Status Former Smoker  . Packs/day: 2.00  . Years: 6.00  . Pack years: 12.00  . Types: Cigarettes  . Last attempt to quit: 09/12/1990  . Years since quitting: 27.0  Smokeless Tobacco Never Used    Exercise Goals and Review: Exercise Goals    Row Name 09/25/17 1419             Exercise Goals   Increase Physical Activity  Yes       Intervention  Provide advice, education, support and counseling about physical activity/exercise needs.;Develop an individualized exercise prescription for aerobic and  resistive training based on initial evaluation findings, risk stratification, comorbidities and participant's personal goals.       Expected Outcomes  Achievement of increased cardiorespiratory fitness and enhanced flexibility, muscular endurance and strength shown through measurements of functional capacity and personal statement of participant.       Increase Strength and Stamina  Yes       Intervention  Provide advice, education, support and counseling about physical activity/exercise needs.;Develop an individualized exercise prescription for aerobic and resistive training based on initial evaluation findings, risk stratification, comorbidities and participant's personal goals.       Expected Outcomes  Achievement of increased cardiorespiratory fitness and enhanced flexibility, muscular endurance and strength shown through measurements of functional capacity and personal statement of participant.       Able to understand and use rate of perceived exertion (RPE) scale  Yes       Intervention  Provide education and explanation on how to use RPE scale       Expected Outcomes  Long Term:  Able to use RPE to guide intensity level when exercising independently;Short Term: Able to use RPE  daily in rehab to express subjective intensity level       Knowledge and understanding of Target Heart Rate Range (THRR)  Yes       Intervention  Provide education and explanation of THRR including how the numbers were predicted and where they are located for reference       Expected Outcomes  Short Term: Able to state/look up THRR;Long Term: Able to use THRR to govern intensity when exercising independently;Short Term: Able to use daily as guideline for intensity in rehab       Able to check pulse independently  Yes       Intervention  Provide education and demonstration on how to check pulse in carotid and radial arteries.;Review the importance of being able to check your own pulse for safety during independent exercise        Expected Outcomes  Short Term: Able to explain why pulse checking is important during independent exercise;Long Term: Able to check pulse independently and accurately       Understanding of Exercise Prescription  Yes       Intervention  Provide education, explanation, and written materials on patient's individual exercise prescription       Expected Outcomes  Short Term: Able to explain program exercise prescription;Long Term: Able to explain home exercise prescription to exercise independently          Copy of goals given to participant.

## 2017-09-25 NOTE — Progress Notes (Signed)
Cardiac Individual Treatment Plan  Patient Details  Name: Erik Bush MRN: [id number] Date of Birth: [birth year] Referring Provider:     Cardiac Rehab from 09/25/2017 in [facility] Cardiac and Pulmonary Rehab  Referring Provider  Paraschos, Alexander MD [Attending: Lujean Amel, MD]      Initial Encounter Date:    Cardiac Rehab from 09/25/2017 in [facility] Cardiac and Pulmonary Rehab  Date  09/25/17  Referring Provider  Paraschos, Alexander MD [Attending: Lujean Amel, MD]      Visit Diagnosis: NSTEMI (non-ST elevated myocardial infarction) [facility])  Status post coronary artery stent placement  Patient's Home Medications on Admission:  Current Outpatient Medications:  .  atorvastatin (LIPITOR) 80 MG tablet, Take 1 tablet (80 mg total) by mouth daily at 6 PM., Disp: 30 tablet, Rfl: 1 .  clopidogrel (PLAVIX) 75 MG tablet, Take 1 tablet (75 mg total) by mouth daily with breakfast., Disp: 30 tablet, Rfl: 2 .  levothyroxine (SYNTHROID, LEVOTHROID) 125 MCG tablet, Take 125 mcg by mouth daily before breakfast., Disp: , Rfl:  .  metoprolol succinate (TOPROL-XL) 25 MG 24 hr tablet, Take 1 tablet (25 mg total) by mouth daily., Disp: 30 tablet, Rfl: 1 .  Multiple Vitamins-Minerals (MULTIVITAMIN WITH MINERALS) tablet, Take 1 tablet by mouth daily., Disp: , Rfl:   Past Medical History: Past Medical History:  Diagnosis Date  . Asthma   . Coronary artery disease   . Hypothermia   . Hypothyroidism   . Pneumonia 2016  . Sleep apnea    USES CPAP  . Wears contact lenses     Tobacco Use: Social History   Tobacco Use  Smoking Status Former Smoker  . Packs/day: 2.00  . Years: 6.00  . Pack years: 12.00  . Types: Cigarettes  . Last attempt to quit: 09/12/1990  . Years since quitting: 27.0  Smokeless Tobacco Never Used    Labs: Recent Review Scientist, physiological    Labs for ITP Cardiac and Pulmonary Rehab Latest Ref Rng & Units 05/12/2014 09/14/2017   Cholestrol 0 - 200 mg/dL - 188   LDLCALC 0 - 99  mg/dL - 107(H)   HDL >40 mg/dL - 36(L)   Trlycerides <150 mg/dL - 225(H)   Hemoglobin A1c 4.2 - 6.3 % 6.1 -       Exercise Target Goals: Date: 09/25/17  Exercise Program Goal: Individual exercise prescription set with THRR, safety & activity barriers. Participant demonstrates ability to understand and report RPE using BORG scale, to self-measure pulse accurately, and to acknowledge the importance of the exercise prescription.  Exercise Prescription Goal: Starting with aerobic activity 30 plus minutes a day, 3 days per week for initial exercise prescription. Provide home exercise prescription and guidelines that participant acknowledges understanding prior to discharge.  Activity Barriers & Risk Stratification: Activity Barriers & Cardiac Risk Stratification - 09/25/17 1133      Activity Barriers & Cardiac Risk Stratification   Activity Barriers  Back Problems;Joint Problems;Shortness of Breath;Deconditioning multiple knee surgeries on right knee    Cardiac Risk Stratification  Moderate       6 Minute Walk: 6 Minute Walk    Row Name 09/25/17 1413         6 Minute Walk   Phase  Initial     Distance  1300 feet     Walk Time  6 minutes     # of Rest Breaks  0     MPH  2.46     METS  3.83     RPE  11     Perceived Dyspnea   2     VO2 Peak  13.39     Symptoms  Yes (comment)     Comments  SOB     Resting HR  66 bpm     Resting BP  134/72     Resting Oxygen Saturation   95 %     Exercise Oxygen Saturation  during 6 min walk  98 %     Max Ex. HR  66 bpm     Max Ex. BP  156/84     2 Minute Post BP  134/80        Oxygen Initial Assessment:   Oxygen Re-Evaluation:   Oxygen Discharge (Final Oxygen Re-Evaluation):   Initial Exercise Prescription: Initial Exercise Prescription - 09/25/17 1400      Date of Initial Exercise RX and Referring Provider   Date  09/25/17    Referring Provider  Paraschos, Alexander MD Attending: Lujean Amel, MD      Treadmill   MPH   2.4    Grade  1    Minutes  15    METs  3.17      Recumbant Elliptical   Level  2    RPM  50    Minutes  15    METs  3.1      Elliptical   Level  1    Speed  2.4    Minutes  15      Prescription Details   Frequency (times per week)  3    Duration  Progress to 45 minutes of aerobic exercise without signs/symptoms of physical distress      Intensity   THRR 40-80% of Max Heartrate  108-150    Ratings of Perceived Exertion  11-13    Perceived Dyspnea  0-4      Progression   Progression  Continue to progress workloads to maintain intensity without signs/symptoms of physical distress.      Resistance Training   Training Prescription  Yes    Weight  4 lbs    Reps  10-15       Perform Capillary Blood Glucose checks as needed.  Exercise Prescription Changes: Exercise Prescription Changes    Row Name 09/25/17 1100             Response to Exercise   Blood Pressure (Admit)  134/72       Blood Pressure (Exercise)  156/84       Blood Pressure (Exit)  134/80       Heart Rate (Admit)  66 bpm       Heart Rate (Exercise)  90 bpm       Heart Rate (Exit)  68 bpm       Oxygen Saturation (Admit)  95 %       Oxygen Saturation (Exit)  98 %       Rating of Perceived Exertion (Exercise)  11       Perceived Dyspnea (Exercise)  2       Symptoms  SOB       Comments  walk test results          Exercise Comments:   Exercise Goals and Review: Exercise Goals    Row Name 09/25/17 1419             Exercise Goals   Increase Physical Activity  Yes       Intervention  Provide advice, education, support and counseling about physical  activity/exercise needs.;Develop an individualized exercise prescription for aerobic and resistive training based on initial evaluation findings, risk stratification, comorbidities and participant's personal goals.       Expected Outcomes  Achievement of increased cardiorespiratory fitness and enhanced flexibility, muscular endurance and strength  shown through measurements of functional capacity and personal statement of participant.       Increase Strength and Stamina  Yes       Intervention  Provide advice, education, support and counseling about physical activity/exercise needs.;Develop an individualized exercise prescription for aerobic and resistive training based on initial evaluation findings, risk stratification, comorbidities and participant's personal goals.       Expected Outcomes  Achievement of increased cardiorespiratory fitness and enhanced flexibility, muscular endurance and strength shown through measurements of functional capacity and personal statement of participant.       Able to understand and use rate of perceived exertion (RPE) scale  Yes       Intervention  Provide education and explanation on how to use RPE scale       Expected Outcomes  Long Term:  Able to use RPE to guide intensity level when exercising independently;Short Term: Able to use RPE daily in rehab to express subjective intensity level       Knowledge and understanding of Target Heart Rate Range (THRR)  Yes       Intervention  Provide education and explanation of THRR including how the numbers were predicted and where they are located for reference       Expected Outcomes  Short Term: Able to state/look up THRR;Long Term: Able to use THRR to govern intensity when exercising independently;Short Term: Able to use daily as guideline for intensity in rehab       Able to check pulse independently  Yes       Intervention  Provide education and demonstration on how to check pulse in carotid and radial arteries.;Review the importance of being able to check your own pulse for safety during independent exercise       Expected Outcomes  Short Term: Able to explain why pulse checking is important during independent exercise;Long Term: Able to check pulse independently and accurately       Understanding of Exercise Prescription  Yes       Intervention  Provide  education, explanation, and written materials on patient's individual exercise prescription       Expected Outcomes  Short Term: Able to explain program exercise prescription;Long Term: Able to explain home exercise prescription to exercise independently          Exercise Goals Re-Evaluation :   Discharge Exercise Prescription (Final Exercise Prescription Changes): Exercise Prescription Changes - 09/25/17 1100      Response to Exercise   Blood Pressure (Admit)  134/72    Blood Pressure (Exercise)  156/84    Blood Pressure (Exit)  134/80    Heart Rate (Admit)  66 bpm    Heart Rate (Exercise)  90 bpm    Heart Rate (Exit)  68 bpm    Oxygen Saturation (Admit)  95 %    Oxygen Saturation (Exit)  98 %    Rating of Perceived Exertion (Exercise)  11    Perceived Dyspnea (Exercise)  2    Symptoms  SOB    Comments  walk test results       Nutrition:  Target Goals: Understanding of nutrition guidelines, daily intake of sodium 1500mg , cholesterol 200mg , calories 30% from fat and 7% or less from saturated  fats, daily to have 5 or more servings of fruits and vegetables.  Biometrics: Pre Biometrics - 09/25/17 1420      Pre Biometrics   Height  5' 7.5" (1.715 m)    Weight  210 lb 12.8 oz (95.6 kg)    Waist Circumference  39 inches    Hip Circumference  42 inches    Waist to Hip Ratio  0.93 %    BMI (Calculated)  32.51    Single Leg Stand  24.5 seconds        Nutrition Therapy Plan and Nutrition Goals: Nutrition Therapy & Goals - 09/25/17 1118      Intervention Plan   Intervention  Prescribe, educate and counsel regarding individualized specific dietary modifications aiming towards targeted core components such as weight, hypertension, lipid management, diabetes, heart failure and other comorbidities.;Nutrition handout(s) given to patient.    Expected Outcomes  Short Term Goal: Understand basic principles of dietary content, such as calories, fat, sodium, cholesterol and  nutrients.;Short Term Goal: A plan has been developed with personal nutrition goals set during dietitian appointment.;Long Term Goal: Adherence to prescribed nutrition plan.       Nutrition Discharge: Rate Your Plate Scores: Nutrition Assessments - 09/25/17 1120      MEDFICTS Scores   Pre Score  29       Nutrition Goals Re-Evaluation:   Nutrition Goals Discharge (Final Nutrition Goals Re-Evaluation):   Psychosocial: Target Goals: Acknowledge presence or absence of significant depression and/or stress, maximize coping skills, provide positive support system. Participant is able to verbalize types and ability to use techniques and skills needed for reducing stress and depression.   Initial Review & Psychosocial Screening: Initial Psych Review & Screening - 09/25/17 1122      Initial Review   Current issues with  Current Stress Concerns;Current Sleep Concerns    Source of Stress Concerns  Occupation    Comments  JT stated that his job stress is a lot to handle at times. It is about the same compared to before his heart attack. He also revealed that it is very hard for him to get to sleep with his CPAP, but that he does wear it all night.       Family Dynamics   Good Support System?  Yes Wife, mother, brother, sister, and friends      Screening Interventions   Interventions  Yes;Encouraged to exercise;Program counselor consult    Expected Outcomes  Short Term goal: Utilizing psychosocial counselor, staff and physician to assist with identification of specific Stressors or current issues interfering with healing process. Setting desired goal for each stressor or current issue identified.;Long Term Goal: Stressors or current issues are controlled or eliminated.;Short Term goal: Identification and review with participant of any Quality of Life or Depression concerns found by scoring the questionnaire.;Long Term goal: The participant improves quality of Life and PHQ9 Scores as seen by post  scores and/or verbalization of changes       Quality of Life Scores:  Quality of Life - 09/25/17 1131      Quality of Life Scores   Health/Function Pre  23.87 %    Socioeconomic Pre  22.29 %    Psych/Spiritual Pre  22.27 %    Family Pre  25.5 %    GLOBAL Pre  23.39 %       PHQ-9: Recent Review Flowsheet Data    Depression screen [facility] 2/9 09/25/2017   Decreased Interest 0   Down, Depressed, Hopeless 0  PHQ - 2 Score 0   Altered sleeping 2   Tired, decreased energy 1   Change in appetite 1   Feeling bad or failure about yourself  0   Trouble concentrating 0   Moving slowly or fidgety/restless 1   Suicidal thoughts 0   PHQ-9 Score 5   Difficult doing work/chores Not difficult at all     Interpretation of Total Score  Total Score Depression Severity:  1-4 = Minimal depression, 5-9 = Mild depression, 10-14 = Moderate depression, 15-19 = Moderately severe depression, 20-27 = Severe depression   Psychosocial Evaluation and Intervention:   Psychosocial Re-Evaluation:   Psychosocial Discharge (Final Psychosocial Re-Evaluation):   Vocational Rehabilitation: Provide vocational rehab assistance to qualifying candidates.   Vocational Rehab Evaluation & Intervention: Vocational Rehab - 09/25/17 1132      Initial Vocational Rehab Evaluation & Intervention   Assessment shows need for Vocational Rehabilitation  No       Education: Education Goals: Education classes will be provided on a variety of topics geared toward better understanding of heart health and risk factor modification. Participant will state understanding/return demonstration of topics presented as noted by education test scores.  Learning Barriers/Preferences: Learning Barriers/Preferences - 09/25/17 1131      Learning Barriers/Preferences   Learning Barriers  None    Learning Preferences  None       Education Topics: General Nutrition Guidelines/Fats and Fiber: -Group instruction provided by  verbal, written material, models and posters to present the general guidelines for heart healthy nutrition. Gives an explanation and review of dietary fats and fiber.   Controlling Sodium/Reading Food Labels: -Group verbal and written material supporting the discussion of sodium use in heart healthy nutrition. Review and explanation with models, verbal and written materials for utilization of the food label.   Exercise Physiology & Risk Factors: - Group verbal and written instruction with models to review the exercise physiology of the cardiovascular system and associated critical values. Details cardiovascular disease risk factors and the goals associated with each risk factor.   Aerobic Exercise & Resistance Training: - Gives group verbal and written discussion on the health impact of inactivity. On the components of aerobic and resistive training programs and the benefits of this training and how to safely progress through these programs.   Flexibility, Balance, General Exercise Guidelines: - Provides group verbal and written instruction on the benefits of flexibility and balance training programs. Provides general exercise guidelines with specific guidelines to those with heart or lung disease. Demonstration and skill practice provided.   Stress Management: - Provides group verbal and written instruction about the health risks of elevated stress, cause of high stress, and healthy ways to reduce stress.   Depression: - Provides group verbal and written instruction on the correlation between heart/lung disease and depressed mood, treatment options, and the stigmas associated with seeking treatment.   Anatomy & Physiology of the Heart: - Group verbal and written instruction and models provide basic cardiac anatomy and physiology, with the coronary electrical and arterial systems. Review of: AMI, Angina, Valve disease, Heart Failure, Cardiac Arrhythmia, Pacemakers, and the  ICD.   Cardiac Procedures: - Group verbal and written instruction to review commonly prescribed medications for heart disease. Reviews the medication, class of the drug, and side effects. Includes the steps to properly store meds and maintain the prescription regimen. (beta blockers and nitrates)   Cardiac Medications I: - Group verbal and written instruction to review commonly prescribed medications for heart disease. Reviews  the medication, class of the drug, and side effects. Includes the steps to properly store meds and maintain the prescription regimen.   Cardiac Medications II: -Group verbal and written instruction to review commonly prescribed medications for heart disease. Reviews the medication, class of the drug, and side effects. (all other drug classes)    Go Sex-Intimacy & Heart Disease, Get SMART - Goal Setting: - Group verbal and written instruction through game format to discuss heart disease and the return to sexual intimacy. Provides group verbal and written material to discuss and apply goal setting through the application of the S.M.A.R.T. Method.   Other Matters of the Heart: - Provides group verbal, written materials and models to describe Heart Failure, Angina, Valve Disease, Peripheral Artery Disease, and Diabetes in the realm of heart disease. Includes description of the disease process and treatment options available to the cardiac patient.   Exercise & Equipment Safety: - Individual verbal instruction and demonstration of equipment use and safety with use of the equipment.   Cardiac Rehab from 09/25/2017 in [facility] Cardiac and Pulmonary Rehab  Date  09/25/17  Educator  [facility]  Instruction Review Code  1- Verbalizes Understanding      Infection Prevention: - Provides verbal and written material to individual with discussion of infection control including proper hand washing and proper equipment cleaning during exercise session.   Cardiac Rehab from 09/25/2017 in  [facility] Cardiac and Pulmonary Rehab  Date  09/25/17  Educator  [facility]  Instruction Review Code  1- Verbalizes Understanding      Falls Prevention: - Provides verbal and written material to individual with discussion of falls prevention and safety.   Cardiac Rehab from 09/25/2017 in [facility] Cardiac and Pulmonary Rehab  Date  09/25/17  Educator  [facility]  Instruction Review Code  1- Verbalizes Understanding      Diabetes: - Individual verbal and written instruction to review signs/symptoms of diabetes, desired ranges of glucose level fasting, after meals and with exercise. Acknowledge that pre and post exercise glucose checks will be done for 3 sessions at entry of program.   Other: -Provides group and verbal instruction on various topics (see comments)    Knowledge Questionnaire Score: Knowledge Questionnaire Score - 09/25/17 1131      Knowledge Questionnaire Score   Pre Score  18/28 Correct answers reviewed with JT       Core Components/Risk Factors/Patient Goals at Admission: Personal Goals and Risk Factors at Admission - 09/25/17 1114      Core Components/Risk Factors/Patient Goals on Admission    Weight Management  Yes;Obesity;Weight Loss    Intervention  Weight Management: Develop a combined nutrition and exercise program designed to reach desired caloric intake, while maintaining appropriate intake of nutrient and fiber, sodium and fats, and appropriate energy expenditure required for the weight goal.;Weight Management: Provide education and appropriate resources to help participant work on and attain dietary goals.;Weight Management/Obesity: Establish reasonable short term and long term weight goals.;Obesity: Provide education and appropriate resources to help participant work on and attain dietary goals.    Admit Weight  210 lb (95.3 kg)    Goal Weight: Short Term  205 lb (93 kg)    Goal Weight: Long Term  180 lb (81.6 kg)    Expected Outcomes  Short Term: Continue to assess and  modify interventions until short term weight is achieved;Long Term: Adherence to nutrition and physical activity/exercise program aimed toward attainment of established weight goal;Weight Loss: Understanding of general recommendations for a balanced deficit  meal plan, which promotes 1-2 lb weight loss per week and includes a negative energy balance of [id number] kcal/d;Understanding recommendations for meals to include 15-35% energy as protein, 25-35% energy from fat, 35-60% energy from carbohydrates, less than 200mg  of dietary cholesterol, 20-35 gm of total fiber daily;Understanding of distribution of calorie intake throughout the day with the consumption of 4-5 meals/snacks    Lipids  Yes    Intervention  Provide education and support for participant on nutrition & aerobic/resistive exercise along with prescribed medications to achieve LDL 70mg , HDL >40mg .    Expected Outcomes  Short Term: Participant states understanding of desired cholesterol values and is compliant with medications prescribed. Participant is following exercise prescription and nutrition guidelines.;Long Term: Cholesterol controlled with medications as prescribed, with individualized exercise RX and with personalized nutrition plan. Value goals: LDL < 70mg , HDL > 40 mg.    Stress  Yes Job stress    Intervention  Offer individual and/or small group education and counseling on adjustment to heart disease, stress management and health-related lifestyle change. Teach and support self-help strategies.;Refer participants experiencing significant psychosocial distress to appropriate mental health specialists for further evaluation and treatment. When possible, include family members and significant others in education/counseling sessions.    Expected Outcomes  Short Term: Participant demonstrates changes in health-related behavior, relaxation and other stress management skills, ability to obtain effective social support, and compliance with  psychotropic medications if prescribed.;Long Term: Emotional wellbeing is indicated by absence of clinically significant psychosocial distress or social isolation.       Core Components/Risk Factors/Patient Goals Review:    Core Components/Risk Factors/Patient Goals at Discharge (Final Review):    ITP Comments: ITP Comments    Row Name 09/25/17 1109           ITP Comments  Med Review completed. Initial ITP created. Diagnosis can be found in [facility] encounter 09/15/17          Comments: Initial ITP

## 2017-10-02 DIAGNOSIS — Z955 Presence of coronary angioplasty implant and graft: Secondary | ICD-10-CM

## 2017-10-02 DIAGNOSIS — Z48812 Encounter for surgical aftercare following surgery on the circulatory system: Secondary | ICD-10-CM | POA: Diagnosis not present

## 2017-10-02 DIAGNOSIS — I214 Non-ST elevation (NSTEMI) myocardial infarction: Secondary | ICD-10-CM

## 2017-10-02 NOTE — Progress Notes (Signed)
Daily Session Note  Patient Details  Name: Erik Bush MRN: [id number] Date of Birth: [birth year] Referring Provider:     Cardiac Rehab from 09/25/2017 in [facility] Cardiac and Pulmonary Rehab  Referring Provider  Isaias Cowman MD [Attending: Lujean Amel, MD]      Encounter Date: 10/02/2017  Check In: Session Check In - 10/02/17 1718      Check-In   Location  [facility]-Cardiac & Pulmonary Rehab    Staff Present  [place], BS, PEC;Nada Maclachlan, BA, ACSM CEP, Exercise Physiologist;Carroll Enterkin, RN, BSN    Supervising physician immediately available to respond to emergencies  See telemetry face sheet for immediately available ER MD    Medication changes reported      No    Fall or balance concerns reported     No    Tobacco Cessation  No Change    Warm-up and Cool-down  Performed on first and last piece of equipment    Resistance Training Performed  Yes    VAD Patient?  No      Pain Assessment   Currently in Pain?  No/denies    Multiple Pain Sites  No          Social History   Tobacco Use  Smoking Status Former Smoker  . Packs/day: 2.00  . Years: 6.00  . Pack years: 12.00  . Types: Cigarettes  . Last attempt to quit: 09/12/1990  . Years since quitting: 27.0  Smokeless Tobacco Never Used    Goals Met:  Independence with exercise equipment Exercise tolerated well No report of cardiac concerns or symptoms Strength training completed today  Goals Unmet:  Not Applicable  Comments: Reviewed RPE scale, THR and program prescription with pt today.  Pt voiced understanding and was given a copy of goals to take home.    Short: Use RPE daily to regulate intensity.  Long: Follow program prescription in THR.    Dr. Emily Filbert is Medical Director for [facility].

## 2017-10-04 DIAGNOSIS — Z48812 Encounter for surgical aftercare following surgery on the circulatory system: Secondary | ICD-10-CM | POA: Diagnosis not present

## 2017-10-04 DIAGNOSIS — I214 Non-ST elevation (NSTEMI) myocardial infarction: Secondary | ICD-10-CM

## 2017-10-04 DIAGNOSIS — Z955 Presence of coronary angioplasty implant and graft: Secondary | ICD-10-CM

## 2017-10-04 NOTE — Progress Notes (Signed)
Daily Session Note  Patient Details  Name: Erik Bush MRN: [id number] Date of Birth: 07-28-1966 Referring Provider:     Cardiac Rehab from 09/25/2017 in [facility] Cardiac and Pulmonary Rehab  Referring Provider  Isaias Cowman MD [Attending: Lujean Amel, MD]      Encounter Date: 10/04/2017  Check In: Session Check In - 10/04/17 1651      Check-In   Location  [facility]-Cardiac & Pulmonary Rehab    Staff Present  Joellyn Rued, BS, PEC;Meredith [place], RN Vickki Hearing, BA, ACSM CEP, Exercise Physiologist    Supervising physician immediately available to respond to emergencies  See telemetry face sheet for immediately available ER MD    Medication changes reported      No    Fall or balance concerns reported     No    Warm-up and Cool-down  Performed on first and last piece of equipment    Resistance Training Performed  Yes    VAD Patient?  No      Pain Assessment   Currently in Pain?  No/denies    Multiple Pain Sites  No        Exercise Prescription Changes - 10/04/17 1500      Response to Exercise   Blood Pressure (Admit)  108/64    Blood Pressure (Exercise)  116/68    Blood Pressure (Exit)  106/72    Heart Rate (Admit)  80 bpm    Heart Rate (Exercise)  140 bpm    Heart Rate (Exit)  73 bpm    Rating of Perceived Exertion (Exercise)  9    Symptoms  none    Duration  Progress to 45 minutes of aerobic exercise without signs/symptoms of physical distress    Intensity  THRR unchanged      Progression   Progression  Continue to progress workloads to maintain intensity without signs/symptoms of physical distress.    Average METs  3.17      Resistance Training   Training Prescription  Yes    Weight  4 lb    Reps  10-15      Interval Training   Interval Training  No      Treadmill   MPH  2.4    Grade  1    Minutes  15    METs  3.17      Recumbant Elliptical   Level  1    RPM  50    Minutes  15       Social History   Tobacco Use  Smoking Status  Former Smoker  . Packs/day: 2.00  . Years: 6.00  . Pack years: 12.00  . Types: Cigarettes  . Last attempt to quit: 09/12/1990  . Years since quitting: 27.0  Smokeless Tobacco Never Used    Goals Met:  Independence with exercise equipment Exercise tolerated well No report of cardiac concerns or symptoms Strength training completed today  Goals Unmet:  Not Applicable  Comments: Pt able to follow exercise prescription today without complaint.  Will continue to monitor for progression.    Dr. Emily Filbert is Medical Director for [facility].

## 2017-10-05 DIAGNOSIS — I214 Non-ST elevation (NSTEMI) myocardial infarction: Secondary | ICD-10-CM

## 2017-10-05 DIAGNOSIS — Z48812 Encounter for surgical aftercare following surgery on the circulatory system: Secondary | ICD-10-CM | POA: Diagnosis not present

## 2017-10-05 NOTE — Progress Notes (Signed)
Daily Session Note  Patient Details  Name: Erik Bush MRN: [id number] Date of Birth: [birth year] Referring Provider:     Cardiac Rehab from 09/25/2017 in [facility] Cardiac and Pulmonary Rehab  Referring Provider  Isaias Cowman MD [Attending: Lujean Amel, MD]      Encounter Date: 10/05/2017  Check In: Session Check In - 10/05/17 1648      Check-In   Location  [facility]-Cardiac & Pulmonary Rehab    Staff Present  Earlean Shawl, BS, ACSM CEP, Exercise Physiologist;Meredith Sherryll Burger, RN BSN;[employer] Flavia Shipper    Supervising physician immediately available to respond to emergencies  See telemetry face sheet for immediately available ER MD    Medication changes reported      No    Fall or balance concerns reported     No    Warm-up and Cool-down  Performed on first and last piece of equipment    Resistance Training Performed  Yes    VAD Patient?  No      Pain Assessment   Currently in Pain?  No/denies          Social History   Tobacco Use  Smoking Status Former Smoker  . Packs/day: 2.00  . Years: 6.00  . Pack years: 12.00  . Types: Cigarettes  . Last attempt to quit: 09/12/1990  . Years since quitting: 27.0  Smokeless Tobacco Never Used    Goals Met:  Independence with exercise equipment Exercise tolerated well No report of cardiac concerns or symptoms Strength training completed today  Goals Unmet:  Not Applicable  Comments: Pt able to follow exercise prescription today without complaint.  Will continue to monitor for progression.   Dr. Emily Filbert is Medical Director for [place] and [facility].

## 2017-10-09 ENCOUNTER — Encounter: Payer: BLUE CROSS/BLUE SHIELD | Admitting: *Deleted

## 2017-10-09 DIAGNOSIS — Z48812 Encounter for surgical aftercare following surgery on the circulatory system: Secondary | ICD-10-CM | POA: Diagnosis not present

## 2017-10-09 DIAGNOSIS — I214 Non-ST elevation (NSTEMI) myocardial infarction: Secondary | ICD-10-CM

## 2017-10-09 DIAGNOSIS — Z955 Presence of coronary angioplasty implant and graft: Secondary | ICD-10-CM

## 2017-10-09 NOTE — Progress Notes (Signed)
Daily Session Note  Patient Details  Name: Erik Bush MRN: [id number] Date of Birth: [birth year] Referring Provider:     Cardiac Rehab from 09/25/2017 in [facility] Cardiac and Pulmonary Rehab  Referring Provider  Isaias Cowman MD [Attending: Lujean Amel, MD]      Encounter Date: 10/09/2017  Check In: Session Check In - 10/09/17 1639      Check-In   Location  [facility]-Cardiac & Pulmonary Rehab    Staff Present  Earlean Shawl, BS, ACSM CEP, Exercise Physiologist;Amanda Oletta Darter, BA, ACSM CEP, Exercise Physiologist;Carroll Enterkin, RN, BSN    Supervising physician immediately available to respond to emergencies  See telemetry face sheet for immediately available ER MD    Medication changes reported      No    Fall or balance concerns reported     No    Warm-up and Cool-down  Performed on first and last piece of equipment    Resistance Training Performed  Yes    VAD Patient?  No      Pain Assessment   Currently in Pain?  No/denies    Multiple Pain Sites  No          Social History   Tobacco Use  Smoking Status Former Smoker  . Packs/day: 2.00  . Years: 6.00  . Pack years: 12.00  . Types: Cigarettes  . Last attempt to quit: 09/12/1990  . Years since quitting: 27.0  Smokeless Tobacco Never Used    Goals Met:  Independence with exercise equipment Exercise tolerated well No report of cardiac concerns or symptoms Strength training completed today  Goals Unmet:  Not Applicable  Comments: Pt able to follow exercise prescription today without complaint.  Will continue to monitor for progression.    Dr. Emily Filbert is Medical Director for [place] and [facility].

## 2017-10-11 ENCOUNTER — Encounter: Payer: Self-pay | Admitting: *Deleted

## 2017-10-11 DIAGNOSIS — Z955 Presence of coronary angioplasty implant and graft: Secondary | ICD-10-CM

## 2017-10-11 DIAGNOSIS — I214 Non-ST elevation (NSTEMI) myocardial infarction: Secondary | ICD-10-CM

## 2017-10-11 DIAGNOSIS — Z48812 Encounter for surgical aftercare following surgery on the circulatory system: Secondary | ICD-10-CM | POA: Diagnosis not present

## 2017-10-11 NOTE — Progress Notes (Signed)
Cardiac Individual Treatment Plan  Patient Details  Name: Erik Bush MRN: [id number] Date of Birth: 1966/02/01 Referring Provider:     Cardiac Rehab from 09/25/2017 in [facility] Cardiac and Pulmonary Rehab  Referring Provider  Paraschos, Alexander MD [Attending: Lujean Amel, MD]      Initial Encounter Date:    Cardiac Rehab from 09/25/2017 in [facility] Cardiac and Pulmonary Rehab  Date  09/25/17  Referring Provider  Paraschos, Alexander MD [Attending: Lujean Amel, MD]      Visit Diagnosis: NSTEMI (non-ST elevated myocardial infarction) [facility])  Status post coronary artery stent placement  Patient's Home Medications on Admission:  Current Outpatient Medications:  .  atorvastatin (LIPITOR) 80 MG tablet, Take 1 tablet (80 mg total) by mouth daily at 6 PM., Disp: 30 tablet, Rfl: 1 .  clopidogrel (PLAVIX) 75 MG tablet, Take 1 tablet (75 mg total) by mouth daily with breakfast., Disp: 30 tablet, Rfl: 2 .  levothyroxine (SYNTHROID, LEVOTHROID) 125 MCG tablet, Take 125 mcg by mouth daily before breakfast., Disp: , Rfl:  .  metoprolol succinate (TOPROL-XL) 25 MG 24 hr tablet, Take 1 tablet (25 mg total) by mouth daily., Disp: 30 tablet, Rfl: 1 .  Multiple Vitamins-Minerals (MULTIVITAMIN WITH MINERALS) tablet, Take 1 tablet by mouth daily., Disp: , Rfl:   Past Medical History: Past Medical History:  Diagnosis Date  . Asthma   . Coronary artery disease   . Hypothermia   . Hypothyroidism   . Pneumonia 2016  . Sleep apnea    USES CPAP  . Wears contact lenses     Tobacco Use: Social History   Tobacco Use  Smoking Status Former Smoker  . Packs/day: 2.00  . Years: 6.00  . Pack years: 12.00  . Types: Cigarettes  . Last attempt to quit: 09/12/1990  . Years since quitting: 27.0  Smokeless Tobacco Never Used    Labs: Recent Review Scientist, physiological    Labs for ITP Cardiac and Pulmonary Rehab Latest Ref Rng & Units 05/12/2014 09/14/2017   Cholestrol 0 - 200 mg/dL - 188   LDLCALC 0 - 99  mg/dL - 107(H)   HDL >40 mg/dL - 36(L)   Trlycerides <150 mg/dL - 225(H)   Hemoglobin A1c 4.2 - 6.3 % 6.1 -       Exercise Target Goals:    Exercise Program Goal: Individual exercise prescription set using results from initial 6 min walk test and THRR while considering  patient's activity barriers and safety.   Exercise Prescription Goal: Initial exercise prescription builds to 30-45 minutes a day of aerobic activity, 2-3 days per week.  Home exercise guidelines will be given to patient during program as part of exercise prescription that the participant will acknowledge.  Activity Barriers & Risk Stratification: Activity Barriers & Cardiac Risk Stratification - 09/25/17 1133      Activity Barriers & Cardiac Risk Stratification   Activity Barriers  Back Problems;Joint Problems;Shortness of Breath;Deconditioning multiple knee surgeries on right knee    Cardiac Risk Stratification  Moderate       6 Minute Walk: 6 Minute Walk    Row Name 09/25/17 1413         6 Minute Walk   Phase  Initial     Distance  1300 feet     Walk Time  6 minutes     # of Rest Breaks  0     MPH  2.46     METS  3.83     RPE  11  Perceived Dyspnea   2     VO2 Peak  13.39     Symptoms  Yes (comment)     Comments  SOB     Resting HR  66 bpm     Resting BP  134/72     Resting Oxygen Saturation   95 %     Exercise Oxygen Saturation  during 6 min walk  98 %     Max Ex. HR  66 bpm     Max Ex. BP  156/84     2 Minute Post BP  134/80        Oxygen Initial Assessment:   Oxygen Re-Evaluation:   Oxygen Discharge (Final Oxygen Re-Evaluation):   Initial Exercise Prescription: Initial Exercise Prescription - 09/25/17 1400      Date of Initial Exercise RX and Referring Provider   Date  09/25/17    Referring Provider  Paraschos, Alexander MD Attending: Lujean Amel, MD      Treadmill   MPH  2.4    Grade  1    Minutes  15    METs  3.17      Recumbant Elliptical   Level  2    RPM   50    Minutes  15    METs  3.1      Elliptical   Level  1    Speed  2.4    Minutes  15      Prescription Details   Frequency (times per week)  3    Duration  Progress to 45 minutes of aerobic exercise without signs/symptoms of physical distress      Intensity   THRR 40-80% of Max Heartrate  108-150    Ratings of Perceived Exertion  11-13    Perceived Dyspnea  0-4      Progression   Progression  Continue to progress workloads to maintain intensity without signs/symptoms of physical distress.      Resistance Training   Training Prescription  Yes    Weight  4 lbs    Reps  10-15       Perform Capillary Blood Glucose checks as needed.  Exercise Prescription Changes: Exercise Prescription Changes    Row Name 09/25/17 1100 10/04/17 1500           Response to Exercise   Blood Pressure (Admit)  134/72  108/64      Blood Pressure (Exercise)  156/84  116/68      Blood Pressure (Exit)  134/80  106/72      Heart Rate (Admit)  66 bpm  80 bpm      Heart Rate (Exercise)  90 bpm  140 bpm      Heart Rate (Exit)  68 bpm  73 bpm      Oxygen Saturation (Admit)  95 %  -      Oxygen Saturation (Exit)  98 %  -      Rating of Perceived Exertion (Exercise)  11  9      Perceived Dyspnea (Exercise)  2  -      Symptoms  SOB  none      Comments  walk test results  -      Duration  -  Progress to 45 minutes of aerobic exercise without signs/symptoms of physical distress      Intensity  -  THRR unchanged        Progression   Progression  -  Continue to progress workloads to maintain  intensity without signs/symptoms of physical distress.      Average METs  -  3.17        Resistance Training   Training Prescription  -  Yes      Weight  -  4 lb      Reps  -  10-15        Interval Training   Interval Training  -  No        Treadmill   MPH  -  2.4      Grade  -  1      Minutes  -  15      METs  -  3.17        Recumbant Elliptical   Level  -  1      RPM  -  50      Minutes  -  15          Exercise Comments:   Exercise Goals and Review: Exercise Goals    Row Name 09/25/17 1419             Exercise Goals   Increase Physical Activity  Yes       Intervention  Provide advice, education, support and counseling about physical activity/exercise needs.;Develop an individualized exercise prescription for aerobic and resistive training based on initial evaluation findings, risk stratification, comorbidities and participant's personal goals.       Expected Outcomes  Achievement of increased cardiorespiratory fitness and enhanced flexibility, muscular endurance and strength shown through measurements of functional capacity and personal statement of participant.       Increase Strength and Stamina  Yes       Intervention  Provide advice, education, support and counseling about physical activity/exercise needs.;Develop an individualized exercise prescription for aerobic and resistive training based on initial evaluation findings, risk stratification, comorbidities and participant's personal goals.       Expected Outcomes  Achievement of increased cardiorespiratory fitness and enhanced flexibility, muscular endurance and strength shown through measurements of functional capacity and personal statement of participant.       Able to understand and use rate of perceived exertion (RPE) scale  Yes       Intervention  Provide education and explanation on how to use RPE scale       Expected Outcomes  Long Term:  Able to use RPE to guide intensity level when exercising independently;Short Term: Able to use RPE daily in rehab to express subjective intensity level       Knowledge and understanding of Target Heart Rate Range (THRR)  Yes       Intervention  Provide education and explanation of THRR including how the numbers were predicted and where they are located for reference       Expected Outcomes  Short Term: Able to state/look up THRR;Long Term: Able to use THRR to govern intensity when  exercising independently;Short Term: Able to use daily as guideline for intensity in rehab       Able to check pulse independently  Yes       Intervention  Provide education and demonstration on how to check pulse in carotid and radial arteries.;Review the importance of being able to check your own pulse for safety during independent exercise       Expected Outcomes  Short Term: Able to explain why pulse checking is important during independent exercise;Long Term: Able to check pulse independently and accurately       Understanding of Exercise Prescription  Yes       Intervention  Provide education, explanation, and written materials on patient's individual exercise prescription       Expected Outcomes  Short Term: Able to explain program exercise prescription;Long Term: Able to explain home exercise prescription to exercise independently          Exercise Goals Re-Evaluation :   Discharge Exercise Prescription (Final Exercise Prescription Changes): Exercise Prescription Changes - 10/04/17 1500      Response to Exercise   Blood Pressure (Admit)  108/64    Blood Pressure (Exercise)  116/68    Blood Pressure (Exit)  106/72    Heart Rate (Admit)  80 bpm    Heart Rate (Exercise)  140 bpm    Heart Rate (Exit)  73 bpm    Rating of Perceived Exertion (Exercise)  9    Symptoms  none    Duration  Progress to 45 minutes of aerobic exercise without signs/symptoms of physical distress    Intensity  THRR unchanged      Progression   Progression  Continue to progress workloads to maintain intensity without signs/symptoms of physical distress.    Average METs  3.17      Resistance Training   Training Prescription  Yes    Weight  4 lb    Reps  10-15      Interval Training   Interval Training  No      Treadmill   MPH  2.4    Grade  1    Minutes  15    METs  3.17      Recumbant Elliptical   Level  1    RPM  50    Minutes  15       Nutrition:  Target Goals: Understanding of  nutrition guidelines, daily intake of sodium '1500mg'$ , cholesterol '200mg'$ , calories 30% from fat and 7% or less from saturated fats, daily to have 5 or more servings of fruits and vegetables.  Biometrics: Pre Biometrics - 09/25/17 1420      Pre Biometrics   Height  5' 7.5" (1.715 m)    Weight  210 lb 12.8 oz (95.6 kg)    Waist Circumference  39 inches    Hip Circumference  42 inches    Waist to Hip Ratio  0.93 %    BMI (Calculated)  32.51    Single Leg Stand  24.5 seconds        Nutrition Therapy Plan and Nutrition Goals: Nutrition Therapy & Goals - 09/25/17 1118      Intervention Plan   Intervention  Prescribe, educate and counsel regarding individualized specific dietary modifications aiming towards targeted core components such as weight, hypertension, lipid management, diabetes, heart failure and other comorbidities.;Nutrition handout(s) given to patient.    Expected Outcomes  Short Term Goal: Understand basic principles of dietary content, such as calories, fat, sodium, cholesterol and nutrients.;Short Term Goal: A plan has been developed with personal nutrition goals set during dietitian appointment.;Long Term Goal: Adherence to prescribed nutrition plan.       Nutrition Assessments: Nutrition Assessments - 09/25/17 1120      MEDFICTS Scores   Pre Score  29       Nutrition Goals Re-Evaluation:   Nutrition Goals Discharge (Final Nutrition Goals Re-Evaluation):   Psychosocial: Target Goals: Acknowledge presence or absence of significant depression and/or stress, maximize coping skills, provide positive support system. Participant is able to verbalize types and ability to use techniques and skills needed for reducing  stress and depression.   Initial Review & Psychosocial Screening: Initial Psych Review & Screening - 09/25/17 1122      Initial Review   Current issues with  Current Stress Concerns;Current Sleep Concerns    Source of Stress Concerns  Occupation     Comments  JT stated that his job stress is a lot to handle at times. It is about the same compared to before his heart attack. He also revealed that it is very hard for him to get to sleep with his CPAP, but that he does wear it all night.       Family Dynamics   Good Support System?  Yes Wife, mother, brother, sister, and friends      Screening Interventions   Interventions  Yes;Encouraged to exercise;Program counselor consult    Expected Outcomes  Short Term goal: Utilizing psychosocial counselor, staff and physician to assist with identification of specific Stressors or current issues interfering with healing process. Setting desired goal for each stressor or current issue identified.;Long Term Goal: Stressors or current issues are controlled or eliminated.;Short Term goal: Identification and review with participant of any Quality of Life or Depression concerns found by scoring the questionnaire.;Long Term goal: The participant improves quality of Life and PHQ9 Scores as seen by post scores and/or verbalization of changes       Quality of Life Scores:  Quality of Life - 09/25/17 1131      Quality of Life Scores   Health/Function Pre  23.87 %    Socioeconomic Pre  22.29 %    Psych/Spiritual Pre  22.27 %    Family Pre  25.5 %    GLOBAL Pre  23.39 %      Scores of 19 and below usually indicate a poorer quality of life in these areas.  A difference of  2-3 points is a clinically meaningful difference.  A difference of 2-3 points in the total score of the Quality of Life Index has been associated with significant improvement in overall quality of life, self-image, physical symptoms, and general health in studies assessing change in quality of life.  PHQ-9: Recent Review Flowsheet Data    Depression screen [facility] 2/9 09/25/2017   Decreased Interest 0   Down, Depressed, Hopeless 0   PHQ - 2 Score 0   Altered sleeping 2   Tired, decreased energy 1   Change in appetite 1   Feeling bad or  failure about yourself  0   Trouble concentrating 0   Moving slowly or fidgety/restless 1   Suicidal thoughts 0   PHQ-9 Score 5   Difficult doing work/chores Not difficult at all     Interpretation of Total Score  Total Score Depression Severity:  1-4 = Minimal depression, 5-9 = Mild depression, 10-14 = Moderate depression, 15-19 = Moderately severe depression, 20-27 = Severe depression   Psychosocial Evaluation and Intervention: Psychosocial Evaluation - 10/02/17 1704      Psychosocial Evaluation & Interventions   Interventions  Encouraged to exercise with the program and follow exercise prescription;Relaxation education;Stress management education    Comments  Counselor met with Mr. Granderson ("JT") today for initial psychosocial evaluation.  JT is a 52 year old who had a heart attack and (2) stents inserted on 1/3.  He has a strong support system with a spouse; many friends locally and a mother who stays in close contact but lives in [place].  JT reports difficulty getting to sleep most nights, but with the use of  his CPAP is able to typically get 6 hours/night.  He has a good appetite.  JT denies a history of depression or anxiety or any current symptoms.  He reports typically being in a positive mood most of the time.  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  He has goals to change this and "slow down" a bit; lose some weight and strengthen his heart while in this program.  Staff will be following with JT throughout the course of this program.    Expected Outcomes  JT will benefit from consistent exercise to achieve his stated goals.  The educational and psychoeducational components of this program will be beneficial in learning more about his condition and ways to cope with stress more positively.  JT will meet with the dietician to address his weight loss goals.    Continue Psychosocial  Services   Follow up required by staff       Psychosocial Re-Evaluation:   Psychosocial Discharge (Final Psychosocial Re-Evaluation):   Vocational Rehabilitation: Provide vocational rehab assistance to qualifying candidates.   Vocational Rehab Evaluation & Intervention: Vocational Rehab - 09/25/17 1132      Initial Vocational Rehab Evaluation & Intervention   Assessment shows need for Vocational Rehabilitation  No       Education: Education Goals: Education classes will be provided on a variety of topics geared toward better understanding of heart health and risk factor modification. Participant will state understanding/return demonstration of topics presented as noted by education test scores.  Learning Barriers/Preferences: Learning Barriers/Preferences - 09/25/17 1131      Learning Barriers/Preferences   Learning Barriers  None    Learning Preferences  None       Education Topics:  AED/CPR: - Group verbal and written instruction with the use of models to demonstrate the basic use of the AED with the basic ABC's of resuscitation.   General Nutrition Guidelines/Fats and Fiber: -Group instruction provided by verbal, written material, models and posters to present the general guidelines for heart healthy nutrition. Gives an explanation and review of dietary fats and fiber.   Cardiac Rehab from 10/09/2017 in [facility] Cardiac and Pulmonary Rehab  Date  10/09/17  Educator  PI  Instruction Review Code  1- Verbalizes Understanding      Controlling Sodium/Reading Food Labels: -Group verbal and written material supporting the discussion of sodium use in heart healthy nutrition. Review and explanation with models, verbal and written materials for utilization of the food label.   Exercise Physiology & General Exercise Guidelines: - Group verbal and written instruction with models to review the exercise physiology of the cardiovascular system and associated critical values.  Provides general exercise guidelines with specific guidelines to those with heart or lung disease.    Aerobic Exercise & Resistance Training: - Gives group verbal and written instruction on the various components of exercise. Focuses on aerobic and resistive training programs and the benefits of this training and how to safely progress through these programs..   Flexibility, Balance, Mind/Body Relaxation: Provides group verbal/written instruction on the benefits of flexibility and balance training, including mind/body exercise modes such as yoga, pilates and tai chi.  Demonstration and skill practice provided.   Stress and Anxiety: - Provides group verbal and written instruction about the health risks of elevated stress and causes of high stress.  Discuss the correlation between heart/lung disease and anxiety and treatment options.  Review healthy ways to manage with stress and anxiety.   Cardiac Rehab from 10/09/2017 in [facility] Cardiac and Pulmonary Rehab  Date  10/04/17  Educator  [facility]  Instruction Review Code  1- Verbalizes Understanding      Depression: - Provides group verbal and written instruction on the correlation between heart/lung disease and depressed mood, treatment options, and the stigmas associated with seeking treatment.   Anatomy & Physiology of the Heart: - Group verbal and written instruction and models provide basic cardiac anatomy and physiology, with the coronary electrical and arterial systems. Review of Valvular disease and Heart Failure   Cardiac Rehab from 10/09/2017 in [facility] Cardiac and Pulmonary Rehab  Date  10/02/17  Educator  CE  Instruction Review Code  1- Verbalizes Understanding      Cardiac Procedures: - Group verbal and written instruction to review commonly prescribed medications for heart disease. Reviews the medication, class of the drug, and side effects. Includes the steps to properly store meds and maintain the prescription regimen. (beta blockers  and nitrates)   Cardiac Medications I: - Group verbal and written instruction to review commonly prescribed medications for heart disease. Reviews the medication, class of the drug, and side effects. Includes the steps to properly store meds and maintain the prescription regimen.   Cardiac Medications II: -Group verbal and written instruction to review commonly prescribed medications for heart disease. Reviews the medication, class of the drug, and side effects. (all other drug classes)    Go Sex-Intimacy & Heart Disease, Get SMART - Goal Setting: - Group verbal and written instruction through game format to discuss heart disease and the return to sexual intimacy. Provides group verbal and written material to discuss and apply goal setting through the application of the S.M.A.R.T. Method.   Other Matters of the Heart: - Provides group verbal, written materials and models to describe Stable Angina and Peripheral Artery. Includes description of the disease process and treatment options available to the cardiac patient.   Exercise & Equipment Safety: - Individual verbal instruction and demonstration of equipment use and safety with use of the equipment.   Cardiac Rehab from 10/09/2017 in [facility] Cardiac and Pulmonary Rehab  Date  09/25/17  Educator  [facility]  Instruction Review Code  1- Verbalizes Understanding      Infection Prevention: - Provides verbal and written material to individual with discussion of infection control including proper hand washing and proper equipment cleaning during exercise session.   Cardiac Rehab from 10/09/2017 in [facility] Cardiac and Pulmonary Rehab  Date  09/25/17  Educator  [facility]  Instruction Review Code  1- Verbalizes Understanding      Falls Prevention: - Provides verbal and written material to individual with discussion of falls prevention and safety.   Cardiac Rehab from 10/09/2017 in [facility] Cardiac and Pulmonary Rehab  Date  09/25/17  Educator  [facility]  Instruction  Review Code  1- Verbalizes Understanding      Diabetes: - Individual verbal and written instruction to review signs/symptoms of diabetes, desired ranges of glucose level fasting, after meals and with exercise. Acknowledge that pre and post exercise glucose checks will be done for 3 sessions at entry of program.   Know Your Numbers and Risk Factors: -Group verbal and written instruction about important numbers in your health.  Discussion of what are risk factors and how they play a role in the disease process.  Review of Cholesterol, Blood Pressure, Diabetes, and BMI and the role they play in your overall health.  Sleep Hygiene: -Provides group verbal and written instruction about how sleep can affect your health.  Define sleep hygiene, discuss sleep cycles and impact of sleep habits. Review good sleep hygiene tips.    Other: -Provides group and verbal instruction on various topics (see comments)   Knowledge Questionnaire Score: Knowledge Questionnaire Score - 09/25/17 1131      Knowledge Questionnaire Score   Pre Score  18/28 Correct answers reviewed with JT       Core Components/Risk Factors/Patient Goals at Admission: Personal Goals and Risk Factors at Admission - 09/25/17 1114      Core Components/Risk Factors/Patient Goals on Admission    Weight Management  Yes;Obesity;Weight Loss    Intervention  Weight Management: Develop a combined nutrition and exercise program designed to reach desired caloric intake, while maintaining appropriate intake of nutrient and fiber, sodium and fats, and appropriate energy expenditure required for the weight goal.;Weight Management: Provide education and appropriate resources to help participant work on and attain dietary goals.;Weight Management/Obesity: Establish reasonable short term and long term weight goals.;Obesity: Provide education and appropriate resources to help participant work on and attain dietary goals.    Admit Weight  210 lb  (95.3 kg)    Goal Weight: Short Term  205 lb (93 kg)    Goal Weight: Long Term  180 lb (81.6 kg)    Expected Outcomes  Short Term: Continue to assess and modify interventions until short term weight is achieved;Long Term: Adherence to nutrition and physical activity/exercise program aimed toward attainment of established weight goal;Weight Loss: Understanding of general recommendations for a balanced deficit meal plan, which promotes 1-2 lb weight loss per week and includes a negative energy balance of [phone] kcal/d;Understanding recommendations for meals to include 15-35% energy as protein, 25-35% energy from fat, 35-60% energy from carbohydrates, less than '200mg'$  of dietary cholesterol, 20-35 gm of total fiber daily;Understanding of distribution of calorie intake throughout the day with the consumption of 4-5 meals/snacks    Lipids  Yes    Intervention  Provide education and support for participant on nutrition & aerobic/resistive exercise along with prescribed medications to achieve LDL '70mg'$ , HDL >'40mg'$ .    Expected Outcomes  Short Term: Participant states understanding of desired cholesterol values and is compliant with medications prescribed. Participant is following exercise prescription and nutrition guidelines.;Long Term: Cholesterol controlled with medications as prescribed, with individualized exercise RX and with personalized nutrition plan. Value goals: LDL < '70mg'$ , HDL > 40 mg.    Stress  Yes Job stress    Intervention  Offer individual and/or small group education and counseling on adjustment to heart disease, stress management and health-related lifestyle change. Teach and support self-help strategies.;Refer participants experiencing significant psychosocial distress to appropriate mental health specialists for further evaluation and treatment. When possible, include family members and significant others in education/counseling sessions.    Expected Outcomes  Short Term: Participant  demonstrates changes in health-related behavior, relaxation and other stress management skills, ability to obtain effective social support, and compliance with psychotropic medications if prescribed.;Long Term: Emotional wellbeing is indicated by absence of clinically significant psychosocial distress or social isolation.       Core Components/Risk Factors/Patient Goals Review:    Core Components/Risk Factors/Patient Goals at Discharge (Final Review):    ITP Comments: ITP Comments    Row Name 09/25/17 1109 10/11/17 0644         ITP Comments  Med Review completed. Initial ITP created. Diagnosis can be found in [facility] encounter 09/15/17  30  Day review. Continue with ITP unless directed changes per Medical Director review.  New to program         Comments:

## 2017-10-11 NOTE — Progress Notes (Signed)
Daily Session Note  Patient Details  Name: Erik Bush MRN: [id number] Date of Birth: Apr 23, 1966 Referring Provider:     Cardiac Rehab from 09/25/2017 in [facility] Cardiac and Pulmonary Rehab  Referring Provider  Isaias Cowman MD [Attending: Lujean Amel, MD]      Encounter Date: 10/11/2017  Check In: Session Check In - 10/11/17 1720      Check-In   Staff Present  Renita Papa, RN Vickki Hearing, BA, ACSM CEP, Exercise Physiologist;Carroll Enterkin, RN, BSN    Supervising physician immediately available to respond to emergencies  See telemetry face sheet for immediately available ER MD    Medication changes reported      No    Fall or balance concerns reported     No    Warm-up and Cool-down  Performed on first and last piece of equipment    Resistance Training Performed  Yes    VAD Patient?  No      Pain Assessment   Currently in Pain?  No/denies    Multiple Pain Sites  No          Social History   Tobacco Use  Smoking Status Former Smoker  . Packs/day: 2.00  . Years: 6.00  . Pack years: 12.00  . Types: Cigarettes  . Last attempt to quit: 09/12/1990  . Years since quitting: 27.0  Smokeless Tobacco Never Used    Goals Met:  Independence with exercise equipment Exercise tolerated well No report of cardiac concerns or symptoms Strength training completed today  Goals Unmet:  Not Applicable  Comments: Pt able to follow exercise prescription today without complaint.  Will continue to monitor for progression.    Dr. Emily Filbert is Medical Director for [place] and [facility].

## 2017-10-12 DIAGNOSIS — I214 Non-ST elevation (NSTEMI) myocardial infarction: Secondary | ICD-10-CM

## 2017-10-12 DIAGNOSIS — Z48812 Encounter for surgical aftercare following surgery on the circulatory system: Secondary | ICD-10-CM | POA: Diagnosis not present

## 2017-10-12 NOTE — Progress Notes (Signed)
Daily Session Note  Patient Details  Name: Erik Bush MRN: [id number] Date of Birth: Apr 24, 1966 Referring Provider:     Cardiac Rehab from 09/25/2017 in [facility] Cardiac and Pulmonary Rehab  Referring Provider  Isaias Cowman MD [Attending: Lujean Amel, MD]      Encounter Date: 10/12/2017  Check In: Session Check In - 10/12/17 1720      Check-In   Location  [facility]-Cardiac & Pulmonary Rehab    Staff Present  Earlean Shawl, BS, ACSM CEP, Exercise Physiologist;Meredith Sherryll Burger, RN BSN;Joseph Flavia Shipper    Supervising physician immediately available to respond to emergencies  See telemetry face sheet for immediately available ER MD    Medication changes reported      No    Fall or balance concerns reported     No    Warm-up and Cool-down  Performed on first and last piece of equipment    Resistance Training Performed  Yes    VAD Patient?  No      Pain Assessment   Currently in Pain?  No/denies          Social History   Tobacco Use  Smoking Status Former Smoker  . Packs/day: 2.00  . Years: 6.00  . Pack years: 12.00  . Types: Cigarettes  . Last attempt to quit: 09/12/1990  . Years since quitting: 27.1  Smokeless Tobacco Never Used    Goals Met:  Independence with exercise equipment Exercise tolerated well No report of cardiac concerns or symptoms Strength training completed today  Goals Unmet:  Not Applicable  Comments: Pt able to follow exercise prescription today without complaint.  Will continue to monitor for progression.   Dr. Emily Filbert is Medical Director for [place] and [facility].

## 2017-10-16 ENCOUNTER — Encounter: Payer: BLUE CROSS/BLUE SHIELD | Attending: Internal Medicine | Admitting: *Deleted

## 2017-10-16 DIAGNOSIS — Z955 Presence of coronary angioplasty implant and graft: Secondary | ICD-10-CM | POA: Diagnosis not present

## 2017-10-16 DIAGNOSIS — I214 Non-ST elevation (NSTEMI) myocardial infarction: Secondary | ICD-10-CM | POA: Insufficient documentation

## 2017-10-16 DIAGNOSIS — Z48812 Encounter for surgical aftercare following surgery on the circulatory system: Secondary | ICD-10-CM | POA: Insufficient documentation

## 2017-10-16 NOTE — Progress Notes (Signed)
Daily Session Note  Patient Details  Name: Erik Bush MRN: [id number] Date of Birth: 02/07/1966 Referring Provider:     Cardiac Rehab from 09/25/2017 in [facility] Cardiac and Pulmonary Rehab  Referring Provider  Paraschos, [place] MD [Attending: Dwayne Callwood, MD]      Encounter Date: 10/16/2017  Check In: Session Check In - 10/16/17 1638      Check-In   Location  [facility]-Cardiac & Pulmonary Rehab    Staff Present  Kelly Hayes, BS, ACSM CEP, Exercise Physiologist;Amanda Sommer, BA, ACSM CEP, Exercise Physiologist;Carroll Enterkin, RN, BSN    Supervising physician immediately available to respond to emergencies  See telemetry face sheet for immediately available ER MD    Medication changes reported      No    Fall or balance concerns reported     No    Warm-up and Cool-down  Performed on first and last piece of equipment    Resistance Training Performed  Yes    VAD Patient?  No      Pain Assessment   Currently in Pain?  No/denies    Multiple Pain Sites  No          Social History   Tobacco Use  Smoking Status Former Smoker  . Packs/day: 2.00  . Years: 6.00  . Pack years: 12.00  . Types: Cigarettes  . Last attempt to quit: 09/12/1990  . Years since quitting: 27.1  Smokeless Tobacco Never Used    Goals Met:  Independence with exercise equipment Exercise tolerated well No report of cardiac concerns or symptoms Strength training completed today  Goals Unmet:  Not Applicable  Comments: Pt able to follow exercise prescription today without complaint.  Will continue to monitor for progression.    Dr. Mark Miller is Medical Director for [facility] and [facility]. 

## 2017-10-18 DIAGNOSIS — I214 Non-ST elevation (NSTEMI) myocardial infarction: Secondary | ICD-10-CM

## 2017-10-18 DIAGNOSIS — Z48812 Encounter for surgical aftercare following surgery on the circulatory system: Secondary | ICD-10-CM | POA: Diagnosis not present

## 2017-10-18 DIAGNOSIS — Z955 Presence of coronary angioplasty implant and graft: Secondary | ICD-10-CM

## 2017-10-18 NOTE — Progress Notes (Signed)
Daily Session Note  Patient Details  Name: Erik Bush MRN: [id number] Date of Birth: 1966-07-04 Referring Provider:     Cardiac Rehab from 09/25/2017 in [facility] Cardiac and Pulmonary Rehab  Referring Provider  Isaias Cowman MD [Attending: Lujean Amel, MD]      Encounter Date: 10/18/2017  Check In: Session Check In - 10/18/17 1639      Check-In   Location  [facility]-Cardiac & Pulmonary Rehab    Staff Present  Renita Papa, RN Vickki Hearing, BA, ACSM CEP, Exercise Physiologist;Carroll Enterkin, RN, BSN    Supervising physician immediately available to respond to emergencies  See telemetry face sheet for immediately available ER MD    Medication changes reported      No    Fall or balance concerns reported     No    Warm-up and Cool-down  Performed on first and last piece of equipment    Resistance Training Performed  Yes    VAD Patient?  No      Pain Assessment   Currently in Pain?  No/denies    Multiple Pain Sites  No        Exercise Prescription Changes - 10/18/17 1400      Response to Exercise   Blood Pressure (Admit)  92/48    Blood Pressure (Exercise)  108/58    Heart Rate (Admit)  80 bpm    Heart Rate (Exercise)  132 bpm    Heart Rate (Exit)  89 bpm    Rating of Perceived Exertion (Exercise)  14    Symptoms  none    Duration  Progress to 45 minutes of aerobic exercise without signs/symptoms of physical distress    Intensity  THRR unchanged      Progression   Progression  Continue to progress workloads to maintain intensity without signs/symptoms of physical distress.    Average METs  4.16      Resistance Training   Training Prescription  Yes    Weight  4 lb    Reps  10-15      Interval Training   Interval Training  No      Treadmill   MPH  3.5    Grade  1.5    Minutes  15    METs  4.16      Elliptical   Level  3    Minutes  15       Social History   Tobacco Use  Smoking Status Former Smoker  . Packs/day: 2.00  . Years: 6.00  .  Pack years: 12.00  . Types: Cigarettes  . Last attempt to quit: 09/12/1990  . Years since quitting: 27.1  Smokeless Tobacco Never Used    Goals Met:  Independence with exercise equipment Exercise tolerated well No report of cardiac concerns or symptoms Strength training completed today  Goals Unmet:  Not Applicable  Comments: Pt able to follow exercise prescription today without complaint.  Will continue to monitor for progression.    Dr. Emily Filbert is Medical Director for [place] and [facility].

## 2017-10-19 DIAGNOSIS — Z48812 Encounter for surgical aftercare following surgery on the circulatory system: Secondary | ICD-10-CM | POA: Diagnosis not present

## 2017-10-19 DIAGNOSIS — I214 Non-ST elevation (NSTEMI) myocardial infarction: Secondary | ICD-10-CM

## 2017-10-19 NOTE — Progress Notes (Signed)
Daily Session Note  Patient Details  Name: Erik Bush MRN: [id number] Date of Birth: 12-01-1965 Referring Provider:     Cardiac Rehab from 09/25/2017 in [facility] Cardiac and Pulmonary Rehab  Referring Provider  Isaias Cowman MD [Attending: Lujean Amel, MD]      Encounter Date: 10/19/2017  Check In: Session Check In - 10/19/17 1644      Check-In   Location  [facility]-Cardiac & Pulmonary Rehab    Staff Present  Earlean Shawl, BS, ACSM CEP, Exercise Physiologist;Meredith Sherryll Burger, RN BSN;Kaladin Noseworthy Flavia Shipper    Supervising physician immediately available to respond to emergencies  See telemetry face sheet for immediately available ER MD    Medication changes reported      No    Fall or balance concerns reported     No    Warm-up and Cool-down  Performed on first and last piece of equipment    Resistance Training Performed  Yes    VAD Patient?  No      Pain Assessment   Currently in Pain?  No/denies          Social History   Tobacco Use  Smoking Status Former Smoker  . Packs/day: 2.00  . Years: 6.00  . Pack years: 12.00  . Types: Cigarettes  . Last attempt to quit: 09/12/1990  . Years since quitting: 27.1  Smokeless Tobacco Never Used    Goals Met:  Independence with exercise equipment Exercise tolerated well No report of cardiac concerns or symptoms Strength training completed today  Goals Unmet:  Not Applicable  Comments: Pt able to follow exercise prescription today without complaint.  Will continue to monitor for progression.   Dr. Emily Filbert is Medical Director for [place] and [facility].

## 2017-10-23 ENCOUNTER — Encounter: Payer: BLUE CROSS/BLUE SHIELD | Admitting: *Deleted

## 2017-10-23 DIAGNOSIS — Z955 Presence of coronary angioplasty implant and graft: Secondary | ICD-10-CM

## 2017-10-23 DIAGNOSIS — Z48812 Encounter for surgical aftercare following surgery on the circulatory system: Secondary | ICD-10-CM | POA: Diagnosis not present

## 2017-10-23 DIAGNOSIS — I214 Non-ST elevation (NSTEMI) myocardial infarction: Secondary | ICD-10-CM

## 2017-10-23 NOTE — Progress Notes (Signed)
Daily Session Note  Patient Details  Name: Erik Bush MRN: [id number] Date of Birth: 04/12/1966 Referring Provider:     Cardiac Rehab from 09/25/2017 in [facility] Cardiac and Pulmonary Rehab  Referring Provider  Isaias Cowman MD [Attending: Lujean Amel, MD]      Encounter Date: 10/23/2017  Check In: Session Check In - 10/23/17 1745      Check-In   Location  [facility]-Cardiac & Pulmonary Rehab    Staff Present  Earlean Shawl, BS, ACSM CEP, Exercise Physiologist;Amanda Oletta Darter, BA, ACSM CEP, Exercise Physiologist;Mary Kellie Shropshire, RN, BSN, Lauretta Grill RCP,RRT,BSRT    Supervising physician immediately available to respond to emergencies  See telemetry face sheet for immediately available ER MD    Medication changes reported      No    Fall or balance concerns reported     No    Warm-up and Cool-down  Performed on first and last piece of equipment    Resistance Training Performed  Yes    VAD Patient?  No      Pain Assessment   Currently in Pain?  No/denies    Multiple Pain Sites  No          Social History   Tobacco Use  Smoking Status Former Smoker  . Packs/day: 2.00  . Years: 6.00  . Pack years: 12.00  . Types: Cigarettes  . Last attempt to quit: 09/12/1990  . Years since quitting: 27.1  Smokeless Tobacco Never Used    Goals Met:  Independence with exercise equipment Exercise tolerated well Personal goals reviewed No report of cardiac concerns or symptoms Strength training completed today  Goals Unmet:  Not Applicable  Comments: Pt able to follow exercise prescription today without complaint.  Will continue to monitor for progression. Home exercise guidelines reviewed today. Patient demonstrated understanding of guidelines. He plans to walk 1-2 extra days outside of class.    Dr. Emily Filbert is Medical Director for [place] and [facility].

## 2017-10-25 ENCOUNTER — Encounter: Payer: Self-pay | Admitting: Dietician

## 2017-10-25 ENCOUNTER — Encounter: Payer: BLUE CROSS/BLUE SHIELD | Admitting: *Deleted

## 2017-10-25 DIAGNOSIS — Z48812 Encounter for surgical aftercare following surgery on the circulatory system: Secondary | ICD-10-CM | POA: Diagnosis not present

## 2017-10-25 DIAGNOSIS — I214 Non-ST elevation (NSTEMI) myocardial infarction: Secondary | ICD-10-CM

## 2017-10-25 DIAGNOSIS — Z955 Presence of coronary angioplasty implant and graft: Secondary | ICD-10-CM

## 2017-10-25 NOTE — Progress Notes (Signed)
Daily Session Note  Patient Details  Name: Erik Bush MRN: [id number] Date of Birth: 02-09-1966 Referring Provider:     Cardiac Rehab from 09/25/2017 in [facility] Cardiac and Pulmonary Rehab  Referring Provider  Isaias Cowman MD [Attending: Lujean Amel, MD]      Encounter Date: 10/25/2017  Check In: Session Check In - 10/25/17 1701      Check-In   Location  [facility]-Cardiac & Pulmonary Rehab    Staff Present  Renita Papa, RN Vickki Hearing, BA, ACSM CEP, Exercise Physiologist;Carroll Enterkin, RN, BSN    Supervising physician immediately available to respond to emergencies  See telemetry face sheet for immediately available ER MD    Medication changes reported      No    Fall or balance concerns reported     No    Warm-up and Cool-down  Performed on first and last piece of equipment    Resistance Training Performed  Yes    VAD Patient?  No      Pain Assessment   Currently in Pain?  No/denies          Social History   Tobacco Use  Smoking Status Former Smoker  . Packs/day: 2.00  . Years: 6.00  . Pack years: 12.00  . Types: Cigarettes  . Last attempt to quit: 09/12/1990  . Years since quitting: 27.1  Smokeless Tobacco Never Used    Goals Met:  Independence with exercise equipment Exercise tolerated well No report of cardiac concerns or symptoms Strength training completed today  Goals Unmet:  Not Applicable  Comments: Pt able to follow exercise prescription today without complaint.  Will continue to monitor for progression.    Dr. Emily Filbert is Medical Director for [place] and [facility].

## 2017-10-26 DIAGNOSIS — I214 Non-ST elevation (NSTEMI) myocardial infarction: Secondary | ICD-10-CM

## 2017-10-26 DIAGNOSIS — Z48812 Encounter for surgical aftercare following surgery on the circulatory system: Secondary | ICD-10-CM | POA: Diagnosis not present

## 2017-10-26 NOTE — Progress Notes (Signed)
Daily Session Note  Patient Details  Name: Erik Bush MRN: [id number] Date of Birth: 05/22/1966 Referring Provider:     Cardiac Rehab from 09/25/2017 in [facility] Cardiac and Pulmonary Rehab  Referring Provider  Isaias Cowman MD [Attending: Lujean Amel, MD]      Encounter Date: 10/26/2017  Check In: Session Check In - 10/26/17 1701      Check-In   Staff Present  Earlean Shawl, BS, ACSM CEP, Exercise Physiologist;Meredith Sherryll Burger, RN BSN;Vana Arif Flavia Shipper    Supervising physician immediately available to respond to emergencies  See telemetry face sheet for immediately available ER MD    Medication changes reported      No    Fall or balance concerns reported     No    Warm-up and Cool-down  Performed on first and last piece of equipment    Resistance Training Performed  Yes    VAD Patient?  No      Pain Assessment   Currently in Pain?  No/denies          Social History   Tobacco Use  Smoking Status Former Smoker  . Packs/day: 2.00  . Years: 6.00  . Pack years: 12.00  . Types: Cigarettes  . Last attempt to quit: 09/12/1990  . Years since quitting: 27.1  Smokeless Tobacco Never Used    Goals Met:  Independence with exercise equipment Exercise tolerated well No report of cardiac concerns or symptoms Strength training completed today  Goals Unmet:  Not Applicable  Comments: Pt able to follow exercise prescription today without complaint.  Will continue to monitor for progression.   Dr. Emily Filbert is Medical Director for [place] and [facility].

## 2017-10-30 DIAGNOSIS — I214 Non-ST elevation (NSTEMI) myocardial infarction: Secondary | ICD-10-CM

## 2017-10-30 DIAGNOSIS — Z955 Presence of coronary angioplasty implant and graft: Secondary | ICD-10-CM

## 2017-10-30 DIAGNOSIS — Z48812 Encounter for surgical aftercare following surgery on the circulatory system: Secondary | ICD-10-CM | POA: Diagnosis not present

## 2017-10-30 NOTE — Progress Notes (Signed)
Daily Session Note  Patient Details  Name: Erik Bush MRN: [id number] Date of Birth: 05/18/1966 Referring Provider:     Cardiac Rehab from 09/25/2017 in [facility] Cardiac and Pulmonary Rehab  Referring Provider  Isaias Cowman MD [Attending: Lujean Amel, MD]      Encounter Date: 10/30/2017  Check In: Session Check In - 10/30/17 1635      Check-In   Location  [facility]-Cardiac & Pulmonary Rehab    Staff Present  Earlean Shawl, BS, ACSM CEP, Exercise Physiologist;Amanda Oletta Darter, BA, ACSM CEP, Exercise Physiologist;Carroll Enterkin, RN, BSN    Supervising physician immediately available to respond to emergencies  See telemetry face sheet for immediately available ER MD    Medication changes reported      No    Fall or balance concerns reported     No    Warm-up and Cool-down  Performed on first and last piece of equipment    Resistance Training Performed  Yes    VAD Patient?  No      Pain Assessment   Currently in Pain?  No/denies    Multiple Pain Sites  No          Social History   Tobacco Use  Smoking Status Former Smoker  . Packs/day: 2.00  . Years: 6.00  . Pack years: 12.00  . Types: Cigarettes  . Last attempt to quit: 09/12/1990  . Years since quitting: 27.1  Smokeless Tobacco Never Used    Goals Met:  Independence with exercise equipment Exercise tolerated well No report of cardiac concerns or symptoms Strength training completed today  Goals Unmet:  Not Applicable  Comments: Pt able to follow exercise prescription today without complaint.  Will continue to monitor for progression.    Dr. Emily Filbert is Medical Director for [place] and [facility].

## 2017-11-01 ENCOUNTER — Encounter: Payer: BLUE CROSS/BLUE SHIELD | Admitting: *Deleted

## 2017-11-01 DIAGNOSIS — Z48812 Encounter for surgical aftercare following surgery on the circulatory system: Secondary | ICD-10-CM | POA: Diagnosis not present

## 2017-11-01 DIAGNOSIS — Z955 Presence of coronary angioplasty implant and graft: Secondary | ICD-10-CM

## 2017-11-01 DIAGNOSIS — I214 Non-ST elevation (NSTEMI) myocardial infarction: Secondary | ICD-10-CM

## 2017-11-01 NOTE — Progress Notes (Signed)
Daily Session Note  Patient Details  Name: Erik Bush MRN: [id number] Date of Birth: 1966-04-11 Referring Provider:     Cardiac Rehab from 09/25/2017 in [facility] Cardiac and Pulmonary Rehab  Referring Provider  Isaias Cowman MD [Attending: Lujean Amel, MD]      Encounter Date: 11/01/2017  Check In: Session Check In - 11/01/17 1637      Check-In   Location  [facility]-Cardiac & Pulmonary Rehab    Staff Present  Renita Papa, RN Vickki Hearing, BA, ACSM CEP, Exercise Physiologist;Carroll Enterkin, RN, BSN    Supervising physician immediately available to respond to emergencies  See telemetry face sheet for immediately available ER MD    Medication changes reported      No    Fall or balance concerns reported     No    Warm-up and Cool-down  Performed on first and last piece of equipment    Resistance Training Performed  Yes    VAD Patient?  No      Pain Assessment   Currently in Pain?  No/denies        Exercise Prescription Changes - 11/01/17 1500      Response to Exercise   Blood Pressure (Admit)  104/60    Blood Pressure (Exercise)  110/48    Blood Pressure (Exit)  80/60    Heart Rate (Admit)  76 bpm    Heart Rate (Exercise)  121 bpm    Heart Rate (Exit)  75 bpm    Rating of Perceived Exertion (Exercise)  13    Symptoms  none    Duration  Continue with 45 min of aerobic exercise without signs/symptoms of physical distress.    Intensity  THRR unchanged      Progression   Progression  Continue to progress workloads to maintain intensity without signs/symptoms of physical distress.    Average METs  4.8      Resistance Training   Training Prescription  Yes    Weight  4 lb    Reps  10-15      Interval Training   Interval Training  No      Recumbant Elliptical   Level  4    RPM  50    Minutes  15    METs  5.2      Elliptical   Level  3    Speed  2.4    Minutes  15      Home Exercise Plan   Plans to continue exercise at  Home (comment) walking at  home    Frequency  Add 2 additional days to program exercise sessions.    Initial Home Exercises Provided  10/23/17       Social History   Tobacco Use  Smoking Status Former Smoker  . Packs/day: 2.00  . Years: 6.00  . Pack years: 12.00  . Types: Cigarettes  . Last attempt to quit: 09/12/1990  . Years since quitting: 27.1  Smokeless Tobacco Never Used    Goals Met:  Independence with exercise equipment Exercise tolerated well No report of cardiac concerns or symptoms Strength training completed today  Goals Unmet:  Not Applicable  Comments: Pt able to follow exercise prescription today without complaint.  Will continue to monitor for progression.    Dr. Emily Filbert is Medical Director for [place] and [facility].

## 2017-11-02 DIAGNOSIS — Z48812 Encounter for surgical aftercare following surgery on the circulatory system: Secondary | ICD-10-CM | POA: Diagnosis not present

## 2017-11-02 DIAGNOSIS — I214 Non-ST elevation (NSTEMI) myocardial infarction: Secondary | ICD-10-CM

## 2017-11-02 NOTE — Progress Notes (Signed)
Daily Session Note  Patient Details  Name: Erik Bush MRN: [id number] Date of Birth: 1965/12/10 Referring Provider:     Cardiac Rehab from 09/25/2017 in [facility] Cardiac and Pulmonary Rehab  Referring Provider  Isaias Cowman MD [Attending: Lujean Amel, MD]      Encounter Date: 11/02/2017  Check In: Session Check In - 11/02/17 1747      Check-In   Location  [facility]-Cardiac & Pulmonary Rehab    Staff Present  Nada Maclachlan, BA, ACSM CEP, Exercise Physiologist;Kelly Amedeo Plenty, BS, ACSM CEP, Exercise Physiologist;Meredith Sherryll Burger, RN BSN;Ajdin Macke Flavia Shipper    Supervising physician immediately available to respond to emergencies  See telemetry face sheet for immediately available ER MD    Medication changes reported      No    Fall or balance concerns reported     No    Warm-up and Cool-down  Performed on first and last piece of equipment    Resistance Training Performed  Yes    VAD Patient?  No      Pain Assessment   Currently in Pain?  No/denies          Social History   Tobacco Use  Smoking Status Former Smoker  . Packs/day: 2.00  . Years: 6.00  . Pack years: 12.00  . Types: Cigarettes  . Last attempt to quit: 09/12/1990  . Years since quitting: 27.1  Smokeless Tobacco Never Used    Goals Met:  Independence with exercise equipment Exercise tolerated well No report of cardiac concerns or symptoms Strength training completed today  Goals Unmet:  Not Applicable  Comments: Pt able to follow exercise prescription today without complaint.  Will continue to monitor for progression.   Dr. Emily Filbert is Medical Director for [place] and [facility].

## 2017-11-06 ENCOUNTER — Encounter: Payer: BLUE CROSS/BLUE SHIELD | Admitting: *Deleted

## 2017-11-06 DIAGNOSIS — I214 Non-ST elevation (NSTEMI) myocardial infarction: Secondary | ICD-10-CM

## 2017-11-06 DIAGNOSIS — Z48812 Encounter for surgical aftercare following surgery on the circulatory system: Secondary | ICD-10-CM | POA: Diagnosis not present

## 2017-11-06 DIAGNOSIS — Z955 Presence of coronary angioplasty implant and graft: Secondary | ICD-10-CM

## 2017-11-06 NOTE — Progress Notes (Signed)
Daily Session Note  Patient Details  Name: Erik Bush MRN: [id number] Date of Birth: 1966/04/07 Referring Provider:     Cardiac Rehab from 09/25/2017 in [facility] Cardiac and Pulmonary Rehab  Referring Provider  Isaias Cowman MD [Attending: Lujean Amel, MD]      Encounter Date: 11/06/2017  Check In: Session Check In - 11/06/17 1736      Check-In   Location  [facility]-Cardiac & Pulmonary Rehab    Staff Present  Earlean Shawl, BS, ACSM CEP, Exercise Physiologist;Amanda Oletta Darter, BA, ACSM CEP, Exercise Physiologist;Meredith Sherryll Burger, RN BSN;Susanne Bice, RN, BSN, Florestine Avers, RN BSN    Supervising physician immediately available to respond to emergencies  See telemetry face sheet for immediately available ER MD    Medication changes reported      No    Fall or balance concerns reported     No    Warm-up and Cool-down  Performed on first and last piece of equipment    Resistance Training Performed  Yes    VAD Patient?  No      Pain Assessment   Currently in Pain?  No/denies    Multiple Pain Sites  No          Social History   Tobacco Use  Smoking Status Former Smoker  . Packs/day: 2.00  . Years: 6.00  . Pack years: 12.00  . Types: Cigarettes  . Last attempt to quit: 09/12/1990  . Years since quitting: 27.1  Smokeless Tobacco Never Used    Goals Met:  Independence with exercise equipment Exercise tolerated well No report of cardiac concerns or symptoms Strength training completed today  Goals Unmet:  Not Applicable  Comments: Pt able to follow exercise prescription today without complaint.  Will continue to monitor for progression.    Dr. Emily Filbert is Medical Director for [place] and [facility].

## 2017-11-08 ENCOUNTER — Encounter: Payer: BLUE CROSS/BLUE SHIELD | Admitting: *Deleted

## 2017-11-08 ENCOUNTER — Encounter: Payer: Self-pay | Admitting: *Deleted

## 2017-11-08 DIAGNOSIS — Z955 Presence of coronary angioplasty implant and graft: Secondary | ICD-10-CM

## 2017-11-08 DIAGNOSIS — Z48812 Encounter for surgical aftercare following surgery on the circulatory system: Secondary | ICD-10-CM | POA: Diagnosis not present

## 2017-11-08 DIAGNOSIS — I214 Non-ST elevation (NSTEMI) myocardial infarction: Secondary | ICD-10-CM

## 2017-11-08 NOTE — Progress Notes (Signed)
Cardiac Individual Treatment Plan  Patient Details  Name: Erik Bush MRN: [id number] Date of Birth: Jun 10, 1966 Referring Provider:     Cardiac Rehab from 09/25/2017 in [facility] Cardiac and Pulmonary Rehab  Referring Provider  Paraschos, Alexander MD [Attending: Lujean Amel, MD]      Initial Encounter Date:    Cardiac Rehab from 09/25/2017 in [facility] Cardiac and Pulmonary Rehab  Date  09/25/17  Referring Provider  Paraschos, Alexander MD [Attending: Lujean Amel, MD]      Visit Diagnosis: NSTEMI (non-ST elevated myocardial infarction) [facility])  Status post coronary artery stent placement  Patient's Home Medications on Admission:  Current Outpatient Medications:  .  atorvastatin (LIPITOR) 80 MG tablet, Take 1 tablet (80 mg total) by mouth daily at 6 PM., Disp: 30 tablet, Rfl: 1 .  clopidogrel (PLAVIX) 75 MG tablet, Take 1 tablet (75 mg total) by mouth daily with breakfast., Disp: 30 tablet, Rfl: 2 .  levothyroxine (SYNTHROID, LEVOTHROID) 125 MCG tablet, Take 125 mcg by mouth daily before breakfast., Disp: , Rfl:  .  metoprolol succinate (TOPROL-XL) 25 MG 24 hr tablet, Take 1 tablet (25 mg total) by mouth daily., Disp: 30 tablet, Rfl: 1 .  Multiple Vitamins-Minerals (MULTIVITAMIN WITH MINERALS) tablet, Take 1 tablet by mouth daily., Disp: , Rfl:   Past Medical History: Past Medical History:  Diagnosis Date  . Asthma   . Coronary artery disease   . Hypothermia   . Hypothyroidism   . Pneumonia 2016  . Sleep apnea    USES CPAP  . Wears contact lenses     Tobacco Use: Social History   Tobacco Use  Smoking Status Former Smoker  . Packs/day: 2.00  . Years: 6.00  . Pack years: 12.00  . Types: Cigarettes  . Last attempt to quit: 09/12/1990  . Years since quitting: 27.1  Smokeless Tobacco Never Used    Labs: Recent Review Scientist, physiological    Labs for ITP Cardiac and Pulmonary Rehab Latest Ref Rng & Units 05/12/2014 09/14/2017   Cholestrol 0 - 200 mg/dL - 188   LDLCALC 0 - 99  mg/dL - 107(H)   HDL >40 mg/dL - 36(L)   Trlycerides <150 mg/dL - 225(H)   Hemoglobin A1c 4.2 - 6.3 % 6.1 -       Exercise Target Goals:    Exercise Program Goal: Individual exercise prescription set using results from initial 6 min walk test and THRR while considering  patient's activity barriers and safety.   Exercise Prescription Goal: Initial exercise prescription builds to 30-45 minutes a day of aerobic activity, 2-3 days per week.  Home exercise guidelines will be given to patient during program as part of exercise prescription that the participant will acknowledge.  Activity Barriers & Risk Stratification: Activity Barriers & Cardiac Risk Stratification - 09/25/17 1133      Activity Barriers & Cardiac Risk Stratification   Activity Barriers  Back Problems;Joint Problems;Shortness of Breath;Deconditioning multiple knee surgeries on right knee    Cardiac Risk Stratification  Moderate       6 Minute Walk: 6 Minute Walk    Row Name 09/25/17 1413         6 Minute Walk   Phase  Initial     Distance  1300 feet     Walk Time  6 minutes     # of Rest Breaks  0     MPH  2.46     METS  3.83     RPE  11  Perceived Dyspnea   2     VO2 Peak  13.39     Symptoms  Yes (comment)     Comments  SOB     Resting HR  66 bpm     Resting BP  134/72     Resting Oxygen Saturation   95 %     Exercise Oxygen Saturation  during 6 min walk  98 %     Max Ex. HR  66 bpm     Max Ex. BP  156/84     2 Minute Post BP  134/80        Oxygen Initial Assessment:   Oxygen Re-Evaluation:   Oxygen Discharge (Final Oxygen Re-Evaluation):   Initial Exercise Prescription: Initial Exercise Prescription - 09/25/17 1400      Date of Initial Exercise RX and Referring Provider   Date  09/25/17    Referring Provider  Paraschos, Alexander MD Attending: Lujean Amel, MD      Treadmill   MPH  2.4    Grade  1    Minutes  15    METs  3.17      Recumbant Elliptical   Level  2    RPM   50    Minutes  15    METs  3.1      Elliptical   Level  1    Speed  2.4    Minutes  15      Prescription Details   Frequency (times per week)  3    Duration  Progress to 45 minutes of aerobic exercise without signs/symptoms of physical distress      Intensity   THRR 40-80% of Max Heartrate  108-150    Ratings of Perceived Exertion  11-13    Perceived Dyspnea  0-4      Progression   Progression  Continue to progress workloads to maintain intensity without signs/symptoms of physical distress.      Resistance Training   Training Prescription  Yes    Weight  4 lbs    Reps  10-15       Perform Capillary Blood Glucose checks as needed.  Exercise Prescription Changes: Exercise Prescription Changes    Row Name 09/25/17 1100 10/04/17 1500 10/18/17 1400 10/23/17 1700 11/01/17 1500     Response to Exercise   Blood Pressure (Admit)  134/72  108/64  92/48  -  104/60   Blood Pressure (Exercise)  156/84  116/68  108/58  -  110/48   Blood Pressure (Exit)  134/80  106/72  -  -  80/60   Heart Rate (Admit)  66 bpm  80 bpm  80 bpm  -  76 bpm   Heart Rate (Exercise)  90 bpm  140 bpm  132 bpm  -  121 bpm   Heart Rate (Exit)  68 bpm  73 bpm  89 bpm  -  75 bpm   Oxygen Saturation (Admit)  95 %  -  -  -  -   Oxygen Saturation (Exit)  98 %  -  -  -  -   Rating of Perceived Exertion (Exercise)  _0 -  13   Perceived Dyspnea (Exercise)  2  -  -  -  -   Symptoms  SOB  none  none  -  none   Comments  walk test results  -  -  -  -   Duration  -  Progress  to 45 minutes of aerobic exercise without signs/symptoms of physical distress  Progress to 45 minutes of aerobic exercise without signs/symptoms of physical distress  Progress to 45 minutes of aerobic exercise without signs/symptoms of physical distress  Continue with 45 min of aerobic exercise without signs/symptoms of physical distress.   Intensity  -  THRR unchanged  THRR unchanged  THRR unchanged  THRR unchanged     Progression    Progression  -  Continue to progress workloads to maintain intensity without signs/symptoms of physical distress.  Continue to progress workloads to maintain intensity without signs/symptoms of physical distress.  Continue to progress workloads to maintain intensity without signs/symptoms of physical distress.  Continue to progress workloads to maintain intensity without signs/symptoms of physical distress.   Average METs  -  3.17  4.16  4.16  4.8     Resistance Training   Training Prescription  -  Yes  Yes  Yes  Yes   Weight  -  4 lb  4 lb  4 lb  4 lb   Reps  -  10-15  10-15  10-15  10-15     Interval Training   Interval Training  -  No  No  No  No     Treadmill   MPH  -  2.4  3.5  3.5  -   Grade  -  1  1.5  1.5  -   Minutes  -  _0 -   METs  -  3.17  4.16  4.16  -     Recumbant Elliptical   Level  -  1  -  -  4   RPM  -  50  -  -  50   Minutes  -  15  -  -  15   METs  -  -  -  -  5.2     Elliptical   Level  -  -  _1 Speed  -  -  -  -  2.4   Minutes  -  -  _2 Home Exercise Plan   Plans to continue exercise at  -  -  -  Home (comment) walking at home  Home (comment) walking at home   Frequency  -  -  -  Add 2 additional days to program exercise sessions.  Add 2 additional days to program exercise sessions.   Initial Home Exercises Provided  -  -  -  10/23/17  10/23/17      Exercise Comments: Exercise Comments    Row Name 10/23/17 1748           Exercise Comments  Home exercise guidelines reviewed today. Patient demonstrated understanding of guidelines. He plans to walk 1-2 extra days outside of class.           Exercise Goals and Review: Exercise Goals    Row Name 09/25/17 1419             Exercise Goals   Increase Physical Activity  Yes       Intervention  Provide advice, education, support and counseling about physical activity/exercise needs.;Develop an individualized exercise prescription for aerobic and resistive training based  on initial evaluation findings, risk stratification, comorbidities and participant's personal goals.       Expected Outcomes  Achievement of increased cardiorespiratory fitness and enhanced flexibility, muscular endurance  and strength shown through measurements of functional capacity and personal statement of participant.       Increase Strength and Stamina  Yes       Intervention  Provide advice, education, support and counseling about physical activity/exercise needs.;Develop an individualized exercise prescription for aerobic and resistive training based on initial evaluation findings, risk stratification, comorbidities and participant's personal goals.       Expected Outcomes  Achievement of increased cardiorespiratory fitness and enhanced flexibility, muscular endurance and strength shown through measurements of functional capacity and personal statement of participant.       Able to understand and use rate of perceived exertion (RPE) scale  Yes       Intervention  Provide education and explanation on how to use RPE scale       Expected Outcomes  Long Term:  Able to use RPE to guide intensity level when exercising independently;Short Term: Able to use RPE daily in rehab to express subjective intensity level       Knowledge and understanding of Target Heart Rate Range (THRR)  Yes       Intervention  Provide education and explanation of THRR including how the numbers were predicted and where they are located for reference       Expected Outcomes  Short Term: Able to state/look up THRR;Long Term: Able to use THRR to govern intensity when exercising independently;Short Term: Able to use daily as guideline for intensity in rehab       Able to check pulse independently  Yes       Intervention  Provide education and demonstration on how to check pulse in carotid and radial arteries.;Review the importance of being able to check your own pulse for safety during independent exercise       Expected Outcomes   Short Term: Able to explain why pulse checking is important during independent exercise;Long Term: Able to check pulse independently and accurately       Understanding of Exercise Prescription  Yes       Intervention  Provide education, explanation, and written materials on patient's individual exercise prescription       Expected Outcomes  Short Term: Able to explain program exercise prescription;Long Term: Able to explain home exercise prescription to exercise independently          Exercise Goals Re-Evaluation : Exercise Goals Re-Evaluation    Row Name 10/18/17 1422 10/23/17 1748 11/01/17 1522         Exercise Goal Re-Evaluation   Exercise Goals Review  Increase Physical Activity;Able to understand and use rate of perceived exertion (RPE) scale;Increase Strength and Stamina;Knowledge and understanding of Target Heart Rate Range (THRR)  Increase Physical Activity;Increase Strength and Stamina  Increase Physical Activity;Increase Strength and Stamina;Able to understand and use Dyspnea scale;Able to understand and use rate of perceived exertion (RPE) scale;Knowledge and understanding of Target Heart Rate Range (THRR)     Comments  Pt is progressing well with exercise and has increased levels on Elliptical and TM  Home exercise guidelines reviewed today. Patient demonstrated understanding of guidelines. He plans to walk 1-2 extra days outside of class.   JT has increased average MET level to 4.8.  Staff will continue to monitor     Expected Outcomes  Short - Pt will continue to attend regularly Long - PT will improve overall MET levels  -  Short - JT will continue to progress workloads and work in [place] will maintain fitness on his own  Discharge Exercise Prescription (Final Exercise Prescription Changes): Exercise Prescription Changes - 11/01/17 1500      Response to Exercise   Blood Pressure (Admit)  104/60    Blood Pressure (Exercise)  110/48    Blood Pressure (Exit)  80/60     Heart Rate (Admit)  76 bpm    Heart Rate (Exercise)  121 bpm    Heart Rate (Exit)  75 bpm    Rating of Perceived Exertion (Exercise)  13    Symptoms  none    Duration  Continue with 45 min of aerobic exercise without signs/symptoms of physical distress.    Intensity  THRR unchanged      Progression   Progression  Continue to progress workloads to maintain intensity without signs/symptoms of physical distress.    Average METs  4.8      Resistance Training   Training Prescription  Yes    Weight  4 lb    Reps  10-15      Interval Training   Interval Training  No      Recumbant Elliptical   Level  4    RPM  50    Minutes  15    METs  5.2      Elliptical   Level  3    Speed  2.4    Minutes  15      Home Exercise Plan   Plans to continue exercise at  Home (comment) walking at home    Frequency  Add 2 additional days to program exercise sessions.    Initial Home Exercises Provided  10/23/17       Nutrition:  Target Goals: Understanding of nutrition guidelines, daily intake of sodium '1500mg'$ , cholesterol '200mg'$ , calories 30% from fat and 7% or less from saturated fats, daily to have 5 or more servings of fruits and vegetables.  Biometrics: Pre Biometrics - 09/25/17 1420      Pre Biometrics   Height  5' 7.5" (1.715 m)    Weight  210 lb 12.8 oz (95.6 kg)    Waist Circumference  39 inches    Hip Circumference  42 inches    Waist to Hip Ratio  0.93 %    BMI (Calculated)  32.51    Single Leg Stand  24.5 seconds        Nutrition Therapy Plan and Nutrition Goals: Nutrition Therapy & Goals - 10/25/17 1552      Personal Nutrition Goals   Comments  Provided 1700-1800kcal plan including 40% carbohydrate minimum, 30% protein and 30% fat.       Nutrition Assessments: Nutrition Assessments - 09/25/17 1120      MEDFICTS Scores   Pre Score  29       Nutrition Goals Re-Evaluation:   Nutrition Goals Discharge (Final Nutrition Goals  Re-Evaluation):   Psychosocial: Target Goals: Acknowledge presence or absence of significant depression and/or stress, maximize coping skills, provide positive support system. Participant is able to verbalize types and ability to use techniques and skills needed for reducing stress and depression.   Initial Review & Psychosocial Screening: Initial Psych Review & Screening - 09/25/17 1122      Initial Review   Current issues with  Current Stress Concerns;Current Sleep Concerns    Source of Stress Concerns  Occupation    Comments  JT stated that his job stress is a lot to handle at times. It is about the same compared to before his heart attack. He also revealed that it is  very hard for him to get to sleep with his CPAP, but that he does wear it all night.       Family Dynamics   Good Support System?  Yes Wife, mother, brother, sister, and friends      Screening Interventions   Interventions  Yes;Encouraged to exercise;Program counselor consult    Expected Outcomes  Short Term goal: Utilizing psychosocial counselor, staff and physician to assist with identification of specific Stressors or current issues interfering with healing process. Setting desired goal for each stressor or current issue identified.;Long Term Goal: Stressors or current issues are controlled or eliminated.;Short Term goal: Identification and review with participant of any Quality of Life or Depression concerns found by scoring the questionnaire.;Long Term goal: The participant improves quality of Life and PHQ9 Scores as seen by post scores and/or verbalization of changes       Quality of Life Scores:  Quality of Life - 09/25/17 1131      Quality of Life Scores   Health/Function Pre  23.87 %    Socioeconomic Pre  22.29 %    Psych/Spiritual Pre  22.27 %    Family Pre  25.5 %    GLOBAL Pre  23.39 %      Scores of 19 and below usually indicate a poorer quality of life in these areas.  A difference of  2-3 points is a  clinically meaningful difference.  A difference of 2-3 points in the total score of the Quality of Life Index has been associated with significant improvement in overall quality of life, self-image, physical symptoms, and general health in studies assessing change in quality of life.  PHQ-9: Recent Review Flowsheet Data    Depression screen [facility] 2/9 09/25/2017   Decreased Interest 0   Down, Depressed, Hopeless 0   PHQ - 2 Score 0   Altered sleeping 2   Tired, decreased energy 1   Change in appetite 1   Feeling bad or failure about yourself  0   Trouble concentrating 0   Moving slowly or fidgety/restless 1   Suicidal thoughts 0   PHQ-9 Score 5   Difficult doing work/chores Not difficult at all     Interpretation of Total Score  Total Score Depression Severity:  1-4 = Minimal depression, 5-9 = Mild depression, 10-14 = Moderate depression, 15-19 = Moderately severe depression, 20-27 = Severe depression   Psychosocial Evaluation and Intervention: Psychosocial Evaluation - 10/02/17 1704      Psychosocial Evaluation & Interventions   Interventions  Encouraged to exercise with the program and follow exercise prescription;Relaxation education;Stress management education    Comments  Counselor met with Mr. Fritze ("JT") today for initial psychosocial evaluation.  JT is a 52 year old who had a heart attack and (2) stents inserted on 1/3.  He has a strong support system with a spouse; many friends locally and a mother who stays in close contact but lives in [place].  JT reports difficulty getting to sleep most nights, but with the use of his CPAP is able to typically get 6 hours/night.  He has a good appetite.  JT denies a history of depression or anxiety or any current symptoms.  He reports typically being in a positive mood most of the time.  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  He has goals  to change this and "slow down" a bit;  lose some weight and strengthen his heart while in this program.  Staff will be following with JT throughout the course of this program.    Expected Outcomes  JT will benefit from consistent exercise to achieve his stated goals.  The educational and psychoeducational components of this program will be beneficial in learning more about his condition and ways to cope with stress more positively.  JT will meet with the dietician to address his weight loss goals.    Continue Psychosocial Services   Follow up required by staff       Psychosocial Re-Evaluation: Psychosocial Re-Evaluation    [place] Name 11/01/17 1749             Psychosocial Re-Evaluation   Current issues with  Current Stress Concerns       Comments  JT still has work stress, but he has cut back on some of his overtime and his mindset to not let his work consume him has improved. He is sleeping a little bit better at night, still adjusting with his CPAP. His goal to strengthen his heart is being met, and he is excited that his doctor cleared him to play softball starting in March.        Expected Outcomes  Short: continue working on his mindset at work as far as stress. Long: focus on longterm stress reduction post graduation.          Initial Review   Source of Stress Concerns  Occupation          Psychosocial Discharge (Final Psychosocial Re-Evaluation): Psychosocial Re-Evaluation - 11/01/17 1749      Psychosocial Re-Evaluation   Current issues with  Current Stress Concerns    Comments  JT still has work stress, but he has cut back on some of his overtime and his mindset to not let his work consume him has improved. He is sleeping a little bit better at night, still adjusting with his CPAP. His goal to strengthen his heart is being met, and he is excited that his doctor cleared him to play softball starting in March.     Expected Outcomes  Short: continue working on his mindset at work as  far as stress. Long: focus on longterm stress reduction post graduation.       Initial Review   Source of Stress Concerns  Occupation       Vocational Rehabilitation: Provide vocational rehab assistance to qualifying candidates.   Vocational Rehab Evaluation & Intervention: Vocational Rehab - 09/25/17 1132      Initial Vocational Rehab Evaluation & Intervention   Assessment shows need for Vocational Rehabilitation  No       Education: Education Goals: Education classes will be provided on a variety of topics geared toward better understanding of heart health and risk factor modification. Participant will state understanding/return demonstration of topics presented as noted by education test scores.  Learning Barriers/Preferences: Learning Barriers/Preferences - 09/25/17 1131      Learning Barriers/Preferences   Learning Barriers  None    Learning Preferences  None       Education Topics:  AED/CPR: - Group verbal and written instruction with the use of models to demonstrate the basic use of the AED with the basic ABC's of resuscitation.   Cardiac Rehab from 11/06/2017 in [facility] Cardiac and Pulmonary Rehab  Date  10/23/17  Educator  MA  Instruction Review Code  1- Verbalizes Understanding      General Nutrition Guidelines/Fats and Fiber: -Group instruction provided  by verbal, written material, models and posters to present the general guidelines for heart healthy nutrition. Gives an explanation and review of dietary fats and fiber.   Cardiac Rehab from 11/06/2017 in [facility] Cardiac and Pulmonary Rehab  Date  10/09/17  Educator  PI  Instruction Review Code  1- Verbalizes Understanding      Controlling Sodium/Reading Food Labels: -Group verbal and written material supporting the discussion of sodium use in heart healthy nutrition. Review and explanation with models, verbal and written materials for utilization of the food label.   Cardiac Rehab from 11/06/2017 in [facility] Cardiac  and Pulmonary Rehab  Date  10/16/17  Educator  PI  Instruction Review Code  1- Verbalizes Understanding      Exercise Physiology & General Exercise Guidelines: - Group verbal and written instruction with models to review the exercise physiology of the cardiovascular system and associated critical values. Provides general exercise guidelines with specific guidelines to those with heart or lung disease.    Cardiac Rehab from 11/06/2017 in [facility] Cardiac and Pulmonary Rehab  Date  10/30/17  Educator  [facility]  Instruction Review Code  1- Verbalizes Understanding      Aerobic Exercise & Resistance Training: - Gives group verbal and written instruction on the various components of exercise. Focuses on aerobic and resistive training programs and the benefits of this training and how to safely progress through these programs..   Cardiac Rehab from 11/06/2017 in [facility] Cardiac and Pulmonary Rehab  Date  11/06/17  Educator  AS  Instruction Review Code  1- Verbalizes Understanding      Flexibility, Balance, Mind/Body Relaxation: Provides group verbal/written instruction on the benefits of flexibility and balance training, including mind/body exercise modes such as yoga, pilates and tai chi.  Demonstration and skill practice provided.   Stress and Anxiety: - Provides group verbal and written instruction about the health risks of elevated stress and causes of high stress.  Discuss the correlation between heart/lung disease and anxiety and treatment options. Review healthy ways to manage with stress and anxiety.   Cardiac Rehab from 11/06/2017 in [facility] Cardiac and Pulmonary Rehab  Date  10/04/17  Educator  [facility]  Instruction Review Code  1- Verbalizes Understanding      Depression: - Provides group verbal and written instruction on the correlation between heart/lung disease and depressed mood, treatment options, and the stigmas associated with seeking treatment.   Cardiac Rehab from 11/06/2017 in [facility]  Cardiac and Pulmonary Rehab  Date  11/01/17  Educator  [facility]  Instruction Review Code  1- Verbalizes Understanding      Anatomy & Physiology of the Heart: - Group verbal and written instruction and models provide basic cardiac anatomy and physiology, with the coronary electrical and arterial systems. Review of Valvular disease and Heart Failure   Cardiac Rehab from 11/06/2017 in [facility] Cardiac and Pulmonary Rehab  Date  10/02/17  Educator  CE  Instruction Review Code  1- Verbalizes Understanding      Cardiac Procedures: - Group verbal and written instruction to review commonly prescribed medications for heart disease. Reviews the medication, class of the drug, and side effects. Includes the steps to properly store meds and maintain the prescription regimen. (beta blockers and nitrates)   Cardiac Rehab from 11/06/2017 in [facility] Cardiac and Pulmonary Rehab  Date  10/11/17  Educator  [facility]  Instruction Review Code  1- Verbalizes Understanding      Cardiac Medications I: - Group verbal and written instruction to review commonly prescribed  medications for heart disease. Reviews the medication, class of the drug, and side effects. Includes the steps to properly store meds and maintain the prescription regimen.   Cardiac Medications II: -Group verbal and written instruction to review commonly prescribed medications for heart disease. Reviews the medication, class of the drug, and side effects. (all other drug classes)    Go Sex-Intimacy & Heart Disease, Get SMART - Goal Setting: - Group verbal and written instruction through game format to discuss heart disease and the return to sexual intimacy. Provides group verbal and written material to discuss and apply goal setting through the application of the S.M.A.R.T. Method.   Cardiac Rehab from 11/06/2017 in [facility] Cardiac and Pulmonary Rehab  Date  10/11/17  Educator  [facility]  Instruction Review Code  1- Verbalizes Understanding      Other Matters of  the Heart: - Provides group verbal, written materials and models to describe Stable Angina and Peripheral Artery. Includes description of the disease process and treatment options available to the cardiac patient.   Exercise & Equipment Safety: - Individual verbal instruction and demonstration of equipment use and safety with use of the equipment.   Cardiac Rehab from 11/06/2017 in [facility] Cardiac and Pulmonary Rehab  Date  09/25/17  Educator  [facility]  Instruction Review Code  1- Verbalizes Understanding      Infection Prevention: - Provides verbal and written material to individual with discussion of infection control including proper hand washing and proper equipment cleaning during exercise session.   Cardiac Rehab from 11/06/2017 in [facility] Cardiac and Pulmonary Rehab  Date  09/25/17  Educator  [facility]  Instruction Review Code  1- Verbalizes Understanding      Falls Prevention: - Provides verbal and written material to individual with discussion of falls prevention and safety.   Cardiac Rehab from 11/06/2017 in [facility] Cardiac and Pulmonary Rehab  Date  09/25/17  Educator  [facility]  Instruction Review Code  1- Verbalizes Understanding      Diabetes: - Individual verbal and written instruction to review signs/symptoms of diabetes, desired ranges of glucose level fasting, after meals and with exercise. Acknowledge that pre and post exercise glucose checks will be done for 3 sessions at entry of program.   Know Your Numbers and Risk Factors: -Group verbal and written instruction about important numbers in your health.  Discussion of what are risk factors and how they play a role in the disease process.  Review of Cholesterol, Blood Pressure, Diabetes, and BMI and the role they play in your overall health.   Sleep Hygiene: -Provides group verbal and written instruction about how sleep can affect your health.  Define sleep hygiene, discuss sleep cycles and impact of sleep habits. Review good sleep hygiene  tips.    Cardiac Rehab from 11/06/2017 in [facility] Cardiac and Pulmonary Rehab  Date  10/18/17  Educator  [facility]  Instruction Review Code  1- Verbalizes Understanding      Other: -Provides group and verbal instruction on various topics (see comments)   Knowledge Questionnaire Score: Knowledge Questionnaire Score - 09/25/17 1131      Knowledge Questionnaire Score   Pre Score  18/28 Correct answers reviewed with JT       Core Components/Risk Factors/Patient Goals at Admission: Personal Goals and Risk Factors at Admission - 09/25/17 1114      Core Components/Risk Factors/Patient Goals on Admission    Weight Management  Yes;Obesity;Weight Loss    Intervention  Weight Management: Develop a combined nutrition and exercise  program designed to reach desired caloric intake, while maintaining appropriate intake of nutrient and fiber, sodium and fats, and appropriate energy expenditure required for the weight goal.;Weight Management: Provide education and appropriate resources to help participant work on and attain dietary goals.;Weight Management/Obesity: Establish reasonable short term and long term weight goals.;Obesity: Provide education and appropriate resources to help participant work on and attain dietary goals.    Admit Weight  210 lb (95.3 kg)    Goal Weight: Short Term  205 lb (93 kg)    Goal Weight: Long Term  180 lb (81.6 kg)    Expected Outcomes  Short Term: Continue to assess and modify interventions until short term weight is achieved;Long Term: Adherence to nutrition and physical activity/exercise program aimed toward attainment of established weight goal;Weight Loss: Understanding of general recommendations for a balanced deficit meal plan, which promotes 1-2 lb weight loss per week and includes a negative energy balance of [phone] kcal/d;Understanding recommendations for meals to include 15-35% energy as protein, 25-35% energy from fat, 35-60% energy from carbohydrates, less than 274m  of dietary cholesterol, 20-35 gm of total fiber daily;Understanding of distribution of calorie intake throughout the day with the consumption of 4-5 meals/snacks    Lipids  Yes    Intervention  Provide education and support for participant on nutrition & aerobic/resistive exercise along with prescribed medications to achieve LDL <728m HDL >402m   Expected Outcomes  Short Term: Participant states understanding of desired cholesterol values and is compliant with medications prescribed. Participant is following exercise prescription and nutrition guidelines.;Long Term: Cholesterol controlled with medications as prescribed, with individualized exercise RX and with personalized nutrition plan. Value goals: LDL < 74m39mDL > 40 mg.    Stress  Yes Job stress    Intervention  Offer individual and/or small group education and counseling on adjustment to heart disease, stress management and health-related lifestyle change. Teach and support self-help strategies.;Refer participants experiencing significant psychosocial distress to appropriate mental health specialists for further evaluation and treatment. When possible, include family members and significant others in education/counseling sessions.    Expected Outcomes  Short Term: Participant demonstrates changes in health-related behavior, relaxation and other stress management skills, ability to obtain effective social support, and compliance with psychotropic medications if prescribed.;Long Term: Emotional wellbeing is indicated by absence of clinically significant psychosocial distress or social isolation.       Core Components/Risk Factors/Patient Goals Review:  Goals and Risk Factor Review    Row Name 11/01/17 1746             Core Components/Risk Factors/Patient Goals Review   Personal Goals Review  Weight Management/Obesity;Hypertension;Lipids;Stress       Review  JT has noticed an improvement in his stress level since starting [facility]. He  attributes this to exercise and to being more intentional about cutting back at work. His blood pressure has decreased, so much so his doctor recently had to decrease his medication. He feels like his cholesterol is getting better due to excerise and making healthier eating choices. He does report that his weight has not really changed much, even though he is trying.        Expected Outcomes  Short: JT will continue these lifestyle changes he has made during [facility] continue to come to class. Long: JT will start to lose weight per his weight loss plan.           Core Components/Risk Factors/Patient Goals at Discharge (Final Review):  Goals and Risk Factor  Review - 11/01/17 1746      Core Components/Risk Factors/Patient Goals Review   Personal Goals Review  Weight Management/Obesity;Hypertension;Lipids;Stress    Review  JT has noticed an improvement in his stress level since starting [facility]. He attributes this to exercise and to being more intentional about cutting back at work. His blood pressure has decreased, so much so his doctor recently had to decrease his medication. He feels like his cholesterol is getting better due to excerise and making healthier eating choices. He does report that his weight has not really changed much, even though he is trying.     Expected Outcomes  Short: JT will continue these lifestyle changes he has made during [facility] and continue to come to class. Long: JT will start to lose weight per his weight loss plan.        ITP Comments: ITP Comments    Row Name 09/25/17 1109 10/11/17 0644 11/08/17 0610       ITP Comments  Med Review completed. Initial ITP created. Diagnosis can be found in [facility] encounter 09/15/17  30 Day review. Continue with ITP unless directed changes per Medical Director review.  New to program  30 day review. Continue with ITP unless directed changes per Medical Director review.        Comments:

## 2017-11-08 NOTE — Progress Notes (Signed)
Daily Session Note  Patient Details  Name: Erik Bush MRN: [id number] Date of Birth: June 12, 1966 Referring Provider:     Cardiac Rehab from 09/25/2017 in [facility] Cardiac and Pulmonary Rehab  Referring Provider  Isaias Cowman MD [Attending: Lujean Amel, MD]      Encounter Date: 11/08/2017  Check In: Session Check In - 11/08/17 1727      Check-In   Location  [facility]-Cardiac & Pulmonary Rehab    Staff Present  Renita Papa, RN Vickki Hearing, BA, ACSM CEP, Exercise Physiologist;Carroll Enterkin, RN, BSN    Supervising physician immediately available to respond to emergencies  See telemetry face sheet for immediately available ER MD    Medication changes reported      No    Fall or balance concerns reported     No    Warm-up and Cool-down  Performed on first and last piece of equipment    Resistance Training Performed  Yes    VAD Patient?  No      Pain Assessment   Currently in Pain?  No/denies          Social History   Tobacco Use  Smoking Status Former Smoker  . Packs/day: 2.00  . Years: 6.00  . Pack years: 12.00  . Types: Cigarettes  . Last attempt to quit: 09/12/1990  . Years since quitting: 27.1  Smokeless Tobacco Never Used    Goals Met:  Independence with exercise equipment Exercise tolerated well No report of cardiac concerns or symptoms Strength training completed today  Goals Unmet:  Not Applicable  Comments: Pt able to follow exercise prescription today without complaint.  Will continue to monitor for progression.    Dr. Emily Filbert is Medical Director for [place] and [facility].

## 2017-11-09 DIAGNOSIS — I214 Non-ST elevation (NSTEMI) myocardial infarction: Secondary | ICD-10-CM

## 2017-11-09 DIAGNOSIS — Z48812 Encounter for surgical aftercare following surgery on the circulatory system: Secondary | ICD-10-CM | POA: Diagnosis not present

## 2017-11-09 NOTE — Progress Notes (Signed)
Daily Session Note  Patient Details  Name: Erik Bush MRN: [id number] Date of Birth: [birth year] Referring Provider:     Cardiac Rehab from 09/25/2017 in [facility] Cardiac and Pulmonary Rehab  Referring Provider  Isaias Cowman MD [Attending: Lujean Amel, MD]      Encounter Date: 11/09/2017  Check In: Session Check In - 11/09/17 1708      Check-In   Location  [facility]-Cardiac & Pulmonary Rehab    Staff Present  Earlean Shawl, BS, ACSM CEP, Exercise Physiologist;Meredith Sherryll Burger, RN BSN;Marwan Lipe Flavia Shipper    Supervising physician immediately available to respond to emergencies  See telemetry face sheet for immediately available ER MD    Medication changes reported      No    Fall or balance concerns reported     No    Tobacco Cessation  No Change    Warm-up and Cool-down  Performed on first and last piece of equipment    Resistance Training Performed  Yes    VAD Patient?  No      Pain Assessment   Currently in Pain?  No/denies          Social History   Tobacco Use  Smoking Status Former Smoker  . Packs/day: 2.00  . Years: 6.00  . Pack years: 12.00  . Types: Cigarettes  . Last attempt to quit: 09/12/1990  . Years since quitting: 27.1  Smokeless Tobacco Never Used    Goals Met:  Independence with exercise equipment Exercise tolerated well No report of cardiac concerns or symptoms Strength training completed today  Goals Unmet:  Not Applicable  Comments: Pt able to follow exercise prescription today without complaint.  Will continue to monitor for progression.   Dr. Emily Filbert is Medical Director for [place] and [facility].

## 2017-11-13 ENCOUNTER — Encounter: Payer: BLUE CROSS/BLUE SHIELD | Attending: Internal Medicine

## 2017-11-13 DIAGNOSIS — Z955 Presence of coronary angioplasty implant and graft: Secondary | ICD-10-CM | POA: Diagnosis not present

## 2017-11-13 DIAGNOSIS — I214 Non-ST elevation (NSTEMI) myocardial infarction: Secondary | ICD-10-CM | POA: Diagnosis present

## 2017-11-13 DIAGNOSIS — Z48812 Encounter for surgical aftercare following surgery on the circulatory system: Secondary | ICD-10-CM | POA: Diagnosis not present

## 2017-11-13 NOTE — Progress Notes (Signed)
Daily Session Note  Patient Details  Name: Erik Bush MRN: [id number] Date of Birth: 1965/12/22 Referring Provider:     Cardiac Rehab from 09/25/2017 in [facility] Cardiac and Pulmonary Rehab  Referring Provider  Isaias Cowman MD [Attending: Lujean Amel, MD]      Encounter Date: 11/13/2017  Check In: Session Check In - 11/13/17 1802      Check-In   Location  [facility]-Cardiac & Pulmonary Rehab    Staff Present  Renita Papa, RN Moises Blood, BS, ACSM CEP, Exercise Physiologist;Arin Peral Oletta Darter, BA, ACSM CEP, Exercise Physiologist;Carroll Enterkin, RN, BSN    Supervising physician immediately available to respond to emergencies  See telemetry face sheet for immediately available ER MD    Medication changes reported      No    Fall or balance concerns reported     No    Warm-up and Cool-down  Performed on first and last piece of equipment    Resistance Training Performed  Yes    VAD Patient?  No      Pain Assessment   Currently in Pain?  No/denies    Multiple Pain Sites  No          Social History   Tobacco Use  Smoking Status Former Smoker  . Packs/day: 2.00  . Years: 6.00  . Pack years: 12.00  . Types: Cigarettes  . Last attempt to quit: 09/12/1990  . Years since quitting: 27.1  Smokeless Tobacco Never Used    Goals Met:  Independence with exercise equipment Exercise tolerated well No report of cardiac concerns or symptoms Strength training completed today  Goals Unmet:  Not Applicable  Comments: Pt able to follow exercise prescription today without complaint.  Will continue to monitor for progression.    Dr. Emily Filbert is Medical Director for [place] and [facility].

## 2017-11-15 VITALS — Ht 67.5 in | Wt 212.0 lb

## 2017-11-15 DIAGNOSIS — I214 Non-ST elevation (NSTEMI) myocardial infarction: Secondary | ICD-10-CM

## 2017-11-15 DIAGNOSIS — Z48812 Encounter for surgical aftercare following surgery on the circulatory system: Secondary | ICD-10-CM | POA: Diagnosis not present

## 2017-11-15 DIAGNOSIS — Z955 Presence of coronary angioplasty implant and graft: Secondary | ICD-10-CM

## 2017-11-15 NOTE — Progress Notes (Signed)
Daily Session Note  Patient Details  Name: Erik Bush MRN: [id number] Date of Birth: [birth year] Referring Provider:     Cardiac Rehab from 09/25/2017 in [facility] Cardiac and Pulmonary Rehab  Referring Provider  Isaias Cowman MD [Attending: Lujean Amel, MD]      Encounter Date: 11/15/2017  Check In: Session Check In - 11/15/17 1641      Check-In   Location  [facility]-Cardiac & Pulmonary Rehab    Staff Present  Renita Papa, RN Vickki Hearing, BA, ACSM CEP, Exercise Physiologist;Carroll Enterkin, RN, BSN    Supervising physician immediately available to respond to emergencies  See telemetry face sheet for immediately available ER MD    Medication changes reported      No    Fall or balance concerns reported     No    Warm-up and Cool-down  Performed on first and last piece of equipment    Resistance Training Performed  Yes      Pain Assessment   Currently in Pain?  No/denies    Multiple Pain Sites  No        Exercise Prescription Changes - 11/15/17 1500      Response to Exercise   Blood Pressure (Admit)  142/70    Blood Pressure (Exit)  120/60    Heart Rate (Admit)  136 bpm    Heart Rate (Exercise)  165 bpm    Heart Rate (Exit)  90 bpm    Rating of Perceived Exertion (Exercise)  15    Symptoms  none    Duration  Continue with 45 min of aerobic exercise without signs/symptoms of physical distress.    Intensity  THRR unchanged      Progression   Progression  Continue to progress workloads to maintain intensity without signs/symptoms of physical distress.    Average METs  6.8      Resistance Training   Training Prescription  Yes    Weight  4 lb    Reps  10-15      Interval Training   Interval Training  No      Treadmill   MPH  5.5    Grade  1.5    Minutes  15    METs  5.97      Recumbant Elliptical   Level  4    RPM  50    Minutes  15    METs  7.6      Home Exercise Plan   Plans to continue exercise at  Home (comment) walking at home    Frequency  Add 2 additional days to program exercise sessions.    Initial Home Exercises Provided  10/23/17       Social History   Tobacco Use  Smoking Status Former Smoker  . Packs/day: 2.00  . Years: 6.00  . Pack years: 12.00  . Types: Cigarettes  . Last attempt to quit: 09/12/1990  . Years since quitting: 27.1  Smokeless Tobacco Never Used    Goals Met:  Independence with exercise equipment Exercise tolerated well No report of cardiac concerns or symptoms Strength training completed today  Goals Unmet:  Not Applicable  Comments:  6 Minute Walk    Row Name 09/25/17 1413 11/15/17 1807       6 Minute Walk   Phase  Initial  Discharge    Distance  1300 feet  1720 feet    Distance % Change  -  32 %    Distance Feet Change  -  420 ft    Walk Time  6 minutes  6 minutes    # of Rest Breaks  0  0    MPH  2.46  3.25    METS  3.83  4.18    RPE  11  7    Perceived Dyspnea   2  -    VO2 Peak  13.39  14.65    Symptoms  Yes (comment)  No    Comments  SOB  -    Resting HR  66 bpm  84 bpm    Resting BP  134/72  100/62    Resting Oxygen Saturation   95 %  95 %    Exercise Oxygen Saturation  during 6 min walk  98 %  95 %    Max Ex. HR  66 bpm  96 bpm    Max Ex. BP  156/84  96/58    2 Minute Post BP  134/80  -         Dr. Emily Filbert is Medical Director for [place] and [facility].

## 2017-11-16 ENCOUNTER — Encounter: Payer: BLUE CROSS/BLUE SHIELD | Admitting: *Deleted

## 2017-11-16 DIAGNOSIS — Z955 Presence of coronary angioplasty implant and graft: Secondary | ICD-10-CM

## 2017-11-16 DIAGNOSIS — Z48812 Encounter for surgical aftercare following surgery on the circulatory system: Secondary | ICD-10-CM | POA: Diagnosis not present

## 2017-11-16 DIAGNOSIS — I214 Non-ST elevation (NSTEMI) myocardial infarction: Secondary | ICD-10-CM

## 2017-11-16 NOTE — Progress Notes (Signed)
Daily Session Note  Patient Details  Name: Erik Bush MRN: [id number] Date of Birth: 1966-06-13 Referring Provider:     Cardiac Rehab from 09/25/2017 in [facility] Cardiac and Pulmonary Rehab  Referring Provider  Erik Cowman MD [Attending: Lujean Amel, MD]      Encounter Date: 11/16/2017  Check In: Session Check In - 11/16/17 1718      Check-In   Location  [facility]-Cardiac & Pulmonary Rehab    Staff Present  Erik Cowden, RN, BSN, MA;Erik Dorfman Sherryll Burger, RN Erik Bush, BS, ACSM CEP, Exercise Physiologist    Supervising physician immediately available to respond to emergencies  See telemetry face sheet for immediately available ER MD    Medication changes reported      No    Fall or balance concerns reported     No    Warm-up and Cool-down  Performed on first and last piece of equipment    Resistance Training Performed  Yes    VAD Patient?  No      Pain Assessment   Currently in Pain?  No/denies          Social History   Tobacco Use  Smoking Status Former Smoker  . Packs/day: 2.00  . Years: 6.00  . Pack years: 12.00  . Types: Cigarettes  . Last attempt to quit: 09/12/1990  . Years since quitting: 27.1  Smokeless Tobacco Never Used    Goals Met:  Independence with exercise equipment Exercise tolerated well No report of cardiac concerns or symptoms Strength training completed today  Goals Unmet:  Not Applicable  Comments:  Erik Bush graduated today from  rehab with 36 sessions completed.  Details of the patient's exercise prescription and what He needs to do in order to continue the prescription and progress were discussed with patient.  Patient was given a copy of prescription and goals.  Patient verbalized understanding.  Erik Bush plans to continue to exercise by exercising at home.    Dr. Emily Bush is Medical Director for [place] and [facility].

## 2017-11-16 NOTE — Patient Instructions (Signed)
Discharge Patient Instructions  Patient Details  Name: Erik Bush MRN: [id number] Date of Birth: 1966-06-26 Referring Provider:  Yolonda Kida, MD   Number of Visits: 85  Reason for Discharge:  Patient reached a stable level of exercise. Patient independent in their exercise. Patient has met program and personal goals.  Smoking History:  Social History   Tobacco Use  Smoking Status Former Smoker  . Packs/day: 2.00  . Years: 6.00  . Pack years: 12.00  . Types: Cigarettes  . Last attempt to quit: 09/12/1990  . Years since quitting: 27.1  Smokeless Tobacco Never Used    Diagnosis:  NSTEMI (non-ST elevated myocardial infarction) ([place])  Status post coronary artery stent placement  Initial Exercise Prescription: Initial Exercise Prescription - 09/25/17 1400      Date of Initial Exercise RX and Referring Provider   Date  09/25/17    Referring Provider  Paraschos, Alexander MD Attending: Lujean Amel, MD      Treadmill   MPH  2.4    Grade  1    Minutes  15    METs  3.17      Recumbant Elliptical   Level  2    RPM  50    Minutes  15    METs  3.1      Elliptical   Level  1    Speed  2.4    Minutes  15      Prescription Details   Frequency (times per week)  3    Duration  Progress to 45 minutes of aerobic exercise without signs/symptoms of physical distress      Intensity   THRR 40-80% of Max Heartrate  108-150    Ratings of Perceived Exertion  11-13    Perceived Dyspnea  0-4      Progression   Progression  Continue to progress workloads to maintain intensity without signs/symptoms of physical distress.      Resistance Training   Training Prescription  Yes    Weight  4 lbs    Reps  10-15       Discharge Exercise Prescription (Final Exercise Prescription Changes): Exercise Prescription Changes - 11/15/17 1500      Response to Exercise   Blood Pressure (Admit)  142/70    Blood Pressure (Exit)  120/60    Heart Rate (Admit)  136 bpm    Heart Rate (Exercise)  165 bpm    Heart Rate (Exit)  90 bpm    Rating of Perceived Exertion (Exercise)  15    Symptoms  none    Duration  Continue with 45 min of aerobic exercise without signs/symptoms of physical distress.    Intensity  THRR unchanged      Progression   Progression  Continue to progress workloads to maintain intensity without signs/symptoms of physical distress.    Average METs  6.8      Resistance Training   Training Prescription  Yes    Weight  4 lb    Reps  10-15      Interval Training   Interval Training  No      Treadmill   MPH  5.5    Grade  1.5    Minutes  15    METs  5.97      Recumbant Elliptical   Level  4    RPM  50    Minutes  15    METs  7.6      Home Exercise Plan  Plans to continue exercise at  Home (comment) walking at home    Frequency  Add 2 additional days to program exercise sessions.    Initial Home Exercises Provided  10/23/17       Functional Capacity: 6 Minute Walk    Row Name 09/25/17 1413 11/15/17 1807       6 Minute Walk   Phase  Initial  Discharge    Distance  1300 feet  1720 feet    Distance % Change  -  32 %    Distance Feet Change  -  420 ft    Walk Time  6 minutes  6 minutes    # of Rest Breaks  0  0    MPH  2.46  3.25    METS  3.83  4.18    RPE  11  7    Perceived Dyspnea   2  -    VO2 Peak  13.39  14.65    Symptoms  Yes (comment)  No    Comments  SOB  -    Resting HR  66 bpm  84 bpm    Resting BP  134/72  100/62    Resting Oxygen Saturation   95 %  95 %    Exercise Oxygen Saturation  during 6 min walk  98 %  95 %    Max Ex. HR  66 bpm  96 bpm    Max Ex. BP  156/84  96/58    2 Minute Post BP  134/80  -       Quality of Life: Quality of Life - 11/13/17 1646      Quality of Life Scores   Health/Function Post  26 %    Socioeconomic Post  29.25 %    Psych/Spiritual Post  29.14 %    Family Post  28.8 %    GLOBAL Post  27.77 %       Personal Goals: Goals established at orientation with  interventions provided to work toward goal. Personal Goals and Risk Factors at Admission - 09/25/17 1114      Core Components/Risk Factors/Patient Goals on Admission    Weight Management  Yes;Obesity;Weight Loss    Intervention  Weight Management: Develop a combined nutrition and exercise program designed to reach desired caloric intake, while maintaining appropriate intake of nutrient and fiber, sodium and fats, and appropriate energy expenditure required for the weight goal.;Weight Management: Provide education and appropriate resources to help participant work on and attain dietary goals.;Weight Management/Obesity: Establish reasonable short term and long term weight goals.;Obesity: Provide education and appropriate resources to help participant work on and attain dietary goals.    Admit Weight  210 lb (95.3 kg)    Goal Weight: Short Term  205 lb (93 kg)    Goal Weight: Long Term  180 lb (81.6 kg)    Expected Outcomes  Short Term: Continue to assess and modify interventions until short term weight is achieved;Long Term: Adherence to nutrition and physical activity/exercise program aimed toward attainment of established weight goal;Weight Loss: Understanding of general recommendations for a balanced deficit meal plan, which promotes 1-2 lb weight loss per week and includes a negative energy balance of [phone] kcal/d;Understanding recommendations for meals to include 15-35% energy as protein, 25-35% energy from fat, 35-60% energy from carbohydrates, less than '200mg'$  of dietary cholesterol, 20-35 gm of total fiber daily;Understanding of distribution of calorie intake throughout the day with the consumption of 4-5 meals/snacks  Lipids  Yes    Intervention  Provide education and support for participant on nutrition & aerobic/resistive exercise along with prescribed medications to achieve LDL '70mg'$ , HDL >'40mg'$ .    Expected Outcomes  Short Term: Participant states understanding of desired cholesterol values  and is compliant with medications prescribed. Participant is following exercise prescription and nutrition guidelines.;Long Term: Cholesterol controlled with medications as prescribed, with individualized exercise RX and with personalized nutrition plan. Value goals: LDL < '70mg'$ , HDL > 40 mg.    Stress  Yes Job stress    Intervention  Offer individual and/or small group education and counseling on adjustment to heart disease, stress management and health-related lifestyle change. Teach and support self-help strategies.;Refer participants experiencing significant psychosocial distress to appropriate mental health specialists for further evaluation and treatment. When possible, include family members and significant others in education/counseling sessions.    Expected Outcomes  Short Term: Participant demonstrates changes in health-related behavior, relaxation and other stress management skills, ability to obtain effective social support, and compliance with psychotropic medications if prescribed.;Long Term: Emotional wellbeing is indicated by absence of clinically significant psychosocial distress or social isolation.        Personal Goals Discharge: Goals and Risk Factor Review - 11/01/17 1746      Core Components/Risk Factors/Patient Goals Review   Personal Goals Review  Weight Management/Obesity;Hypertension;Lipids;Stress    Review  JT has noticed an improvement in his stress level since starting [facility]. He attributes this to exercise and to being more intentional about cutting back at work. His blood pressure has decreased, so much so his doctor recently had to decrease his medication. He feels like his cholesterol is getting better due to excerise and making healthier eating choices. He does report that his weight has not really changed much, even though he is trying.     Expected Outcomes  Short: JT will continue these lifestyle changes he has made during [facility] and continue to come to  class. Long: JT will start to lose weight per his weight loss plan.        Exercise Goals and Review: Exercise Goals    Row Name 09/25/17 1419             Exercise Goals   Increase Physical Activity  Yes       Intervention  Provide advice, education, support and counseling about physical activity/exercise needs.;Develop an individualized exercise prescription for aerobic and resistive training based on initial evaluation findings, risk stratification, comorbidities and participant's personal goals.       Expected Outcomes  Achievement of increased cardiorespiratory fitness and enhanced flexibility, muscular endurance and strength shown through measurements of functional capacity and personal statement of participant.       Increase Strength and Stamina  Yes       Intervention  Provide advice, education, support and counseling about physical activity/exercise needs.;Develop an individualized exercise prescription for aerobic and resistive training based on initial evaluation findings, risk stratification, comorbidities and participant's personal goals.       Expected Outcomes  Achievement of increased cardiorespiratory fitness and enhanced flexibility, muscular endurance and strength shown through measurements of functional capacity and personal statement of participant.       Able to understand and use rate of perceived exertion (RPE) scale  Yes       Intervention  Provide education and explanation on how to use RPE scale       Expected Outcomes  Long Term:  Able to use RPE to guide intensity  level when exercising independently;Short Term: Able to use RPE daily in rehab to express subjective intensity level       Knowledge and understanding of Target Heart Rate Range (THRR)  Yes       Intervention  Provide education and explanation of THRR including how the numbers were predicted and where they are located for reference       Expected Outcomes  Short Term: Able to state/look up THRR;Long  Term: Able to use THRR to govern intensity when exercising independently;Short Term: Able to use daily as guideline for intensity in rehab       Able to check pulse independently  Yes       Intervention  Provide education and demonstration on how to check pulse in carotid and radial arteries.;Review the importance of being able to check your own pulse for safety during independent exercise       Expected Outcomes  Short Term: Able to explain why pulse checking is important during independent exercise;Long Term: Able to check pulse independently and accurately       Understanding of Exercise Prescription  Yes       Intervention  Provide education, explanation, and written materials on patient's individual exercise prescription       Expected Outcomes  Short Term: Able to explain program exercise prescription;Long Term: Able to explain home exercise prescription to exercise independently          Nutrition & Weight - Outcomes: Pre Biometrics - 09/25/17 1420      Pre Biometrics   Height  5' 7.5" (1.715 m)    Weight  210 lb 12.8 oz (95.6 kg)    Waist Circumference  39 inches    Hip Circumference  42 inches    Waist to Hip Ratio  0.93 %    BMI (Calculated)  32.51    Single Leg Stand  24.5 seconds      Post Biometrics - 11/15/17 1806       Post  Biometrics   Height  5' 7.5" (1.715 m)    Weight  212 lb (96.2 kg)    Waist Circumference  40 inches    Hip Circumference  42.5 inches    Waist to Hip Ratio  0.94 %    BMI (Calculated)  32.69    Single Leg Stand  30 seconds       Nutrition: Nutrition Therapy & Goals - 10/25/17 1552      Personal Nutrition Goals   Comments  Provided 1700-1800kcal plan including 40% carbohydrate minimum, 30% protein and 30% fat.       Nutrition Discharge: Nutrition Assessments - 11/13/17 1647      MEDFICTS Scores   Post Score  12       Education Questionnaire Score: Knowledge Questionnaire Score - 11/13/17 1646      Knowledge Questionnaire Score    Post Score  25/28       Goals reviewed with patient; copy given to patient.

## 2017-11-16 NOTE — Progress Notes (Signed)
Discharge Progress Report  Patient Details  Name: Erik Bush MRN: [id number] Date of Birth: 1966/05/08 Referring Provider:     Cardiac Rehab from 09/25/2017 in [facility]  Referring Provider  Isaias Cowman MD [Attending: Lujean Amel, MD]       Number of Visits: 41  Reason for Discharge:  Patient reached a stable level of exercise. Patient independent in their exercise. Patient has met program and personal goals.  Smoking History:  Social History   Tobacco Use  Smoking Status Former Smoker  . Packs/day: 2.00  . Years: 6.00  . Pack years: 12.00  . Types: Cigarettes  . Last attempt to quit: 09/12/1990  . Years since quitting: 27.1  Smokeless Tobacco Never Used    Diagnosis:  NSTEMI (non-ST elevated myocardial infarction) ([place])  Status post coronary artery stent placement  ADL [facility]:   Initial Exercise Prescription: Initial Exercise Prescription - 09/25/17 1400      Date of Initial Exercise RX and Referring Provider   Date  09/25/17    Referring Provider  Paraschos, Alexander MD Attending: Lujean Amel, MD      Treadmill   MPH  2.4    Grade  1    Minutes  15    METs  3.17      Recumbant Elliptical   Level  2    RPM  50    Minutes  15    METs  3.1      Elliptical   Level  1    Speed  2.4    Minutes  15      Prescription Details   Frequency (times per week)  3    Duration  Progress to 45 minutes of aerobic exercise without signs/symptoms of physical distress      Intensity   THRR 40-80% of Max Heartrate  108-150    Ratings of Perceived Exertion  11-13    Perceived Dyspnea  0-4      Progression   Progression  Continue to progress workloads to maintain intensity without signs/symptoms of physical distress.      Resistance Training   Training Prescription  Yes    Weight  4 lbs    Reps  10-15       Discharge Exercise Prescription (Final Exercise Prescription Changes): Exercise Prescription Changes - 11/15/17  1500      Response to Exercise   Blood Pressure (Admit)  142/70    Blood Pressure (Exit)  120/60    Heart Rate (Admit)  136 bpm    Heart Rate (Exercise)  165 bpm    Heart Rate (Exit)  90 bpm    Rating of Perceived Exertion (Exercise)  15    Symptoms  none    Duration  Continue with 45 min of aerobic exercise without signs/symptoms of physical distress.    Intensity  THRR unchanged      Progression   Progression  Continue to progress workloads to maintain intensity without signs/symptoms of physical distress.    Average METs  6.8      Resistance Training   Training Prescription  Yes    Weight  4 lb    Reps  10-15      Interval Training   Interval Training  No      Treadmill   MPH  5.5    Grade  1.5    Minutes  15    METs  5.97      Recumbant Elliptical   Level  4    RPM  50    Minutes  15    METs  7.6      Home Exercise Plan   Plans to continue exercise at  Home (comment) walking at home    Frequency  Add 2 additional days to program exercise sessions.    Initial Home Exercises Provided  10/23/17       Functional Capacity: 6 Minute Walk    Row Name 09/25/17 1413 11/15/17 1807       6 Minute Walk   Phase  Initial  Discharge    Distance  1300 feet  1720 feet    Distance % Change  -  32 %    Distance Feet Change  -  420 ft    Walk Time  6 minutes  6 minutes    # of Rest Breaks  0  0    MPH  2.46  3.25    METS  3.83  4.18    RPE  11  7    Perceived Dyspnea   2  -    VO2 Peak  13.39  14.65    Symptoms  Yes (comment)  No    Comments  SOB  -    Resting HR  66 bpm  84 bpm    Resting BP  134/72  100/62    Resting Oxygen Saturation   95 %  95 %    Exercise Oxygen Saturation  during 6 min walk  98 %  95 %    Max Ex. HR  66 bpm  96 bpm    Max Ex. BP  156/84  96/58    2 Minute Post BP  134/80  -       Psychological, QOL, Others - Outcomes: PHQ 2/9: Depression screen [facility] 2/9 11/13/2017 09/25/2017  Decreased Interest 0 0  Down, Depressed, Hopeless 0 0  PHQ -  2 Score 0 0  Altered sleeping 3 2  Tired, decreased energy 0 1  Change in appetite 0 1  Feeling bad or failure about yourself  0 0  Trouble concentrating 0 0  Moving slowly or fidgety/restless 0 1  Suicidal thoughts 0 0  PHQ-9 Score 3 5  Difficult doing work/chores Not difficult at all Not difficult at all    Quality of Life: Quality of Life - 11/13/17 1646      Quality of Life Scores   Health/Function Post  26 %    Socioeconomic Post  29.25 %    Psych/Spiritual Post  29.14 %    Family Post  28.8 %    GLOBAL Post  27.77 %       Personal Goals: Goals established at orientation with interventions provided to work toward goal. Personal Goals and Risk Factors at Admission - 09/25/17 1114      Core Components/Risk Factors/Patient Goals on Admission    Weight Management  Yes;Obesity;Weight Loss    Intervention  Weight Management: Develop a combined nutrition and exercise program designed to reach desired caloric intake, while maintaining appropriate intake of nutrient and fiber, sodium and fats, and appropriate energy expenditure required for the weight goal.;Weight Management: Provide education and appropriate resources to help participant work on and attain dietary goals.;Weight Management/Obesity: Establish reasonable short term and long term weight goals.;Obesity: Provide education and appropriate resources to help participant work on and attain dietary goals.    Admit Weight  210 lb (95.3 kg)    Goal Weight: Short Term  205  lb (93 kg)    Goal Weight: Long Term  180 lb (81.6 kg)    Expected Outcomes  Short Term: Continue to assess and modify interventions until short term weight is achieved;Long Term: Adherence to nutrition and physical activity/exercise program aimed toward attainment of established weight goal;Weight Loss: Understanding of general recommendations for a balanced deficit meal plan, which promotes 1-2 lb weight loss per week and includes a negative energy balance of  [phone] kcal/d;Understanding recommendations for meals to include 15-35% energy as protein, 25-35% energy from fat, 35-60% energy from carbohydrates, less than '200mg'$  of dietary cholesterol, 20-35 gm of total fiber daily;Understanding of distribution of calorie intake throughout the day with the consumption of 4-5 meals/snacks    Lipids  Yes    Intervention  Provide education and support for participant on nutrition & aerobic/resistive exercise along with prescribed medications to achieve LDL '70mg'$ , HDL >'40mg'$ .    Expected Outcomes  Short Term: Participant states understanding of desired cholesterol values and is compliant with medications prescribed. Participant is following exercise prescription and nutrition guidelines.;Long Term: Cholesterol controlled with medications as prescribed, with individualized exercise RX and with personalized nutrition plan. Value goals: LDL < '70mg'$ , HDL > 40 mg.    Stress  Yes Job stress    Intervention  Offer individual and/or small group education and counseling on adjustment to heart disease, stress management and health-related lifestyle change. Teach and support self-help strategies.;Refer participants experiencing significant psychosocial distress to appropriate mental health specialists for further evaluation and treatment. When possible, include family members and significant others in education/counseling sessions.    Expected Outcomes  Short Term: Participant demonstrates changes in health-related behavior, relaxation and other stress management skills, ability to obtain effective social support, and compliance with psychotropic medications if prescribed.;Long Term: Emotional wellbeing is indicated by absence of clinically significant psychosocial distress or social isolation.        Personal Goals Discharge: Goals and Risk Factor Review    Row Name 11/01/17 1746             Core Components/Risk Factors/Patient Goals Review   Personal Goals Review  Weight  Management/Obesity;Hypertension;Lipids;Stress       Review  JT has noticed an improvement in his stress level since starting [facility]. He attributes this to exercise and to being more intentional about cutting back at work. His blood pressure has decreased, so much so his doctor recently had to decrease his medication. He feels like his cholesterol is getting better due to excerise and making healthier eating choices. He does report that his weight has not really changed much, even though he is trying.        Expected Outcomes  Short: JT will continue these lifestyle changes he has made during [facility] and continue to come to class. Long: JT will start to lose weight per his weight loss plan.           Exercise Goals and Review: Exercise Goals    Row Name 09/25/17 1419             Exercise Goals   Increase Physical Activity  Yes       Intervention  Provide advice, education, support and counseling about physical activity/exercise needs.;Develop an individualized exercise prescription for aerobic and resistive training based on initial evaluation findings, risk stratification, comorbidities and participant's personal goals.       Expected Outcomes  Achievement of increased cardiorespiratory fitness and enhanced flexibility, muscular endurance and strength shown through measurements of functional capacity  and personal statement of participant.       Increase Strength and Stamina  Yes       Intervention  Provide advice, education, support and counseling about physical activity/exercise needs.;Develop an individualized exercise prescription for aerobic and resistive training based on initial evaluation findings, risk stratification, comorbidities and participant's personal goals.       Expected Outcomes  Achievement of increased cardiorespiratory fitness and enhanced flexibility, muscular endurance and strength shown through measurements of functional capacity and personal statement of  participant.       Able to understand and use rate of perceived exertion (RPE) scale  Yes       Intervention  Provide education and explanation on how to use RPE scale       Expected Outcomes  Long Term:  Able to use RPE to guide intensity level when exercising independently;Short Term: Able to use RPE daily in rehab to express subjective intensity level       Knowledge and understanding of Target Heart Rate Range (THRR)  Yes       Intervention  Provide education and explanation of THRR including how the numbers were predicted and where they are located for reference       Expected Outcomes  Short Term: Able to state/look up THRR;Long Term: Able to use THRR to govern intensity when exercising independently;Short Term: Able to use daily as guideline for intensity in rehab       Able to check pulse independently  Yes       Intervention  Provide education and demonstration on how to check pulse in carotid and radial arteries.;Review the importance of being able to check your own pulse for safety during independent exercise       Expected Outcomes  Short Term: Able to explain why pulse checking is important during independent exercise;Long Term: Able to check pulse independently and accurately       Understanding of Exercise Prescription  Yes       Intervention  Provide education, explanation, and written materials on patient's individual exercise prescription       Expected Outcomes  Short Term: Able to explain program exercise prescription;Long Term: Able to explain home exercise prescription to exercise independently          Nutrition & Weight - Outcomes: Pre Biometrics - 09/25/17 1420      Pre Biometrics   Height  5' 7.5" (1.715 m)    Weight  210 lb 12.8 oz (95.6 kg)    Waist Circumference  39 inches    Hip Circumference  42 inches    Waist to Hip Ratio  0.93 %    BMI (Calculated)  32.51    Single Leg Stand  24.5 seconds      Post Biometrics - 11/15/17 1806       Post  Biometrics    Height  5' 7.5" (1.715 m)    Weight  212 lb (96.2 kg)    Waist Circumference  40 inches    Hip Circumference  42.5 inches    Waist to Hip Ratio  0.94 %    BMI (Calculated)  32.69    Single Leg Stand  30 seconds       Nutrition: Nutrition Therapy & Goals - 09/25/17 1118      Intervention Plan   Intervention  Prescribe, educate and counsel regarding individualized specific dietary modifications aiming towards targeted core components such as weight, hypertension, lipid management, diabetes, heart failure and other comorbidities.;Nutrition handout(s)  given to patient.    Expected Outcomes  Short Term Goal: Understand basic principles of dietary content, such as calories, fat, sodium, cholesterol and nutrients.;Short Term Goal: A plan has been developed with personal nutrition goals set during dietitian appointment.;Long Term Goal: Adherence to prescribed nutrition plan.       Nutrition Discharge: Nutrition Assessments - 11/13/17 1647      MEDFICTS Scores   Post Score  12       Education Questionnaire Score: Knowledge Questionnaire Score - 11/13/17 1646      Knowledge Questionnaire Score   Post Score  25/28       Goals reviewed with patient; copy given to patient.

## 2017-11-16 NOTE — Progress Notes (Signed)
Cardiac Individual Treatment Plan  Patient Details  Name: Erik Bush MRN: [id number] Date of Birth: 1965-09-24 Referring Provider:     Cardiac Rehab from 09/25/2017 in [facility] Cardiac and Pulmonary Rehab  Referring Provider  Paraschos, Alexander MD [Attending: Lujean Amel, MD]      Initial Encounter Date:    Cardiac Rehab from 09/25/2017 in [facility] Cardiac and Pulmonary Rehab  Date  09/25/17  Referring Provider  Paraschos, Alexander MD [Attending: Lujean Amel, MD]      Visit Diagnosis: NSTEMI (non-ST elevated myocardial infarction) [facility])  Status post coronary artery stent placement  Patient's Home Medications on Admission:  Current Outpatient Medications:  .  atorvastatin (LIPITOR) 80 MG tablet, Take 1 tablet (80 mg total) by mouth daily at 6 PM., Disp: 30 tablet, Rfl: 1 .  clopidogrel (PLAVIX) 75 MG tablet, Take 1 tablet (75 mg total) by mouth daily with breakfast., Disp: 30 tablet, Rfl: 2 .  levothyroxine (SYNTHROID, LEVOTHROID) 125 MCG tablet, Take 125 mcg by mouth daily before breakfast., Disp: , Rfl:  .  metoprolol succinate (TOPROL-XL) 25 MG 24 hr tablet, Take 1 tablet (25 mg total) by mouth daily., Disp: 30 tablet, Rfl: 1 .  Multiple Vitamins-Minerals (MULTIVITAMIN WITH MINERALS) tablet, Take 1 tablet by mouth daily., Disp: , Rfl:   Past Medical History: Past Medical History:  Diagnosis Date  . Asthma   . Coronary artery disease   . Hypothermia   . Hypothyroidism   . Pneumonia 2016  . Sleep apnea    USES CPAP  . Wears contact lenses     Tobacco Use: Social History   Tobacco Use  Smoking Status Former Smoker  . Packs/day: 2.00  . Years: 6.00  . Pack years: 12.00  . Types: Cigarettes  . Last attempt to quit: 09/12/1990  . Years since quitting: 27.1  Smokeless Tobacco Never Used    Labs: Recent Review Scientist, physiological    Labs for ITP Cardiac and Pulmonary Rehab Latest Ref Rng & Units 05/12/2014 09/14/2017   Cholestrol 0 - 200 mg/dL - 188   LDLCALC 0 - 99  mg/dL - 107(H)   HDL >40 mg/dL - 36(L)   Trlycerides <150 mg/dL - 225(H)   Hemoglobin A1c 4.2 - 6.3 % 6.1 -       Exercise Target Goals:    Exercise Program Goal: Individual exercise prescription set using results from initial 6 min walk test and THRR while considering  patient's activity barriers and safety.   Exercise Prescription Goal: Initial exercise prescription builds to 30-45 minutes a day of aerobic activity, 2-3 days per week.  Home exercise guidelines will be given to patient during program as part of exercise prescription that the participant will acknowledge.  Activity Barriers & Risk Stratification: Activity Barriers & Cardiac Risk Stratification - 09/25/17 1133      Activity Barriers & Cardiac Risk Stratification   Activity Barriers  Back Problems;Joint Problems;Shortness of Breath;Deconditioning multiple knee surgeries on right knee    Cardiac Risk Stratification  Moderate       6 Minute Walk: 6 Minute Walk    Row Name 09/25/17 1413 11/15/17 1807       6 Minute Walk   Phase  Initial  Discharge    Distance  1300 feet  1720 feet    Distance % Change  -  32 %    Distance Feet Change  -  420 ft    Walk Time  6 minutes  6 minutes    # of  Rest Breaks  0  0    MPH  2.46  3.25    METS  3.83  4.18    RPE  11  7    Perceived Dyspnea   2  -    VO2 Peak  13.39  14.65    Symptoms  Yes (comment)  No    Comments  SOB  -    Resting HR  66 bpm  84 bpm    Resting BP  134/72  100/62    Resting Oxygen Saturation   95 %  95 %    Exercise Oxygen Saturation  during 6 min walk  98 %  95 %    Max Ex. HR  66 bpm  96 bpm    Max Ex. BP  156/84  96/58    2 Minute Post BP  134/80  -       Oxygen Initial Assessment:   Oxygen Re-Evaluation:   Oxygen Discharge (Final Oxygen Re-Evaluation):   Initial Exercise Prescription: Initial Exercise Prescription - 09/25/17 1400      Date of Initial Exercise RX and Referring Provider   Date  09/25/17    Referring Provider   Paraschos, Alexander MD Attending: Lujean Amel, MD      Treadmill   MPH  2.4    Grade  1    Minutes  15    METs  3.17      Recumbant Elliptical   Level  2    RPM  50    Minutes  15    METs  3.1      Elliptical   Level  1    Speed  2.4    Minutes  15      Prescription Details   Frequency (times per week)  3    Duration  Progress to 45 minutes of aerobic exercise without signs/symptoms of physical distress      Intensity   THRR 40-80% of Max Heartrate  108-150    Ratings of Perceived Exertion  11-13    Perceived Dyspnea  0-4      Progression   Progression  Continue to progress workloads to maintain intensity without signs/symptoms of physical distress.      Resistance Training   Training Prescription  Yes    Weight  4 lbs    Reps  10-15       Perform Capillary Blood Glucose checks as needed.  Exercise Prescription Changes: Exercise Prescription Changes    Row Name 09/25/17 1100 10/04/17 1500 10/18/17 1400 10/23/17 1700 11/01/17 1500     Response to Exercise   Blood Pressure (Admit)  134/72  108/64  92/48  -  104/60   Blood Pressure (Exercise)  156/84  116/68  108/58  -  110/48   Blood Pressure (Exit)  134/80  106/72  -  -  80/60   Heart Rate (Admit)  66 bpm  80 bpm  80 bpm  -  76 bpm   Heart Rate (Exercise)  90 bpm  140 bpm  132 bpm  -  121 bpm   Heart Rate (Exit)  68 bpm  73 bpm  89 bpm  -  75 bpm   Oxygen Saturation (Admit)  95 %  -  -  -  -   Oxygen Saturation (Exit)  98 %  -  -  -  -   Rating of Perceived Exertion (Exercise)  '11  9  14  '$ -  13  Perceived Dyspnea (Exercise)  2  -  -  -  -   Symptoms  SOB  none  none  -  none   Comments  walk test results  -  -  -  -   Duration  -  Progress to 45 minutes of aerobic exercise without signs/symptoms of physical distress  Progress to 45 minutes of aerobic exercise without signs/symptoms of physical distress  Progress to 45 minutes of aerobic exercise without signs/symptoms of physical distress  Continue with  45 min of aerobic exercise without signs/symptoms of physical distress.   Intensity  -  THRR unchanged  THRR unchanged  THRR unchanged  THRR unchanged     Progression   Progression  -  Continue to progress workloads to maintain intensity without signs/symptoms of physical distress.  Continue to progress workloads to maintain intensity without signs/symptoms of physical distress.  Continue to progress workloads to maintain intensity without signs/symptoms of physical distress.  Continue to progress workloads to maintain intensity without signs/symptoms of physical distress.   Average METs  -  3.17  4.16  4.16  4.8     Resistance Training   Training Prescription  -  Yes  Yes  Yes  Yes   Weight  -  4 lb  4 lb  4 lb  4 lb   Reps  -  10-15  10-15  10-15  10-15     Interval Training   Interval Training  -  No  No  No  No     Treadmill   MPH  -  2.4  3.5  3.5  -   Grade  -  1  1.5  1.5  -   Minutes  -  '15  15  15  '$ -   METs  -  3.17  4.16  4.16  -     Recumbant Elliptical   Level  -  1  -  -  4   RPM  -  50  -  -  50   Minutes  -  15  -  -  15   METs  -  -  -  -  5.2     Elliptical   Level  -  -  '3  3  3   '$ Speed  -  -  -  -  2.4   Minutes  -  -  '15  15  15     '$ Home Exercise Plan   Plans to continue exercise at  -  -  -  Home (comment) walking at home  Home (comment) walking at home   Frequency  -  -  -  Add 2 additional days to program exercise sessions.  Add 2 additional days to program exercise sessions.   Initial Home Exercises Provided  -  -  -  10/23/17  10/23/17   Row Name 11/15/17 1500             Response to Exercise   Blood Pressure (Admit)  142/70       Blood Pressure (Exit)  120/60       Heart Rate (Admit)  136 bpm       Heart Rate (Exercise)  165 bpm       Heart Rate (Exit)  90 bpm       Rating of Perceived Exertion (Exercise)  15       Symptoms  none       Duration  Continue with 45 min of aerobic exercise without signs/symptoms of physical distress.        Intensity  THRR unchanged         Progression   Progression  Continue to progress workloads to maintain intensity without signs/symptoms of physical distress.       Average METs  6.8         Resistance Training   Training Prescription  Yes       Weight  4 lb       Reps  10-15         Interval Training   Interval Training  No         Treadmill   MPH  5.5       Grade  1.5       Minutes  15       METs  5.97         Recumbant Elliptical   Level  4       RPM  50       Minutes  15       METs  7.6         Home Exercise Plan   Plans to continue exercise at  Home (comment) walking at home       Frequency  Add 2 additional days to program exercise sessions.       Initial Home Exercises Provided  10/23/17          Exercise Comments: Exercise Comments    Row Name 10/23/17 1748           Exercise Comments  Home exercise guidelines reviewed today. Patient demonstrated understanding of guidelines. He plans to walk 1-2 extra days outside of class.           Exercise Goals and Review: Exercise Goals    Row Name 09/25/17 1419             Exercise Goals   Increase Physical Activity  Yes       Intervention  Provide advice, education, support and counseling about physical activity/exercise needs.;Develop an individualized exercise prescription for aerobic and resistive training based on initial evaluation findings, risk stratification, comorbidities and participant's personal goals.       Expected Outcomes  Achievement of increased cardiorespiratory fitness and enhanced flexibility, muscular endurance and strength shown through measurements of functional capacity and personal statement of participant.       Increase Strength and Stamina  Yes       Intervention  Provide advice, education, support and counseling about physical activity/exercise needs.;Develop an individualized exercise prescription for aerobic and resistive training based on initial evaluation findings, risk  stratification, comorbidities and participant's personal goals.       Expected Outcomes  Achievement of increased cardiorespiratory fitness and enhanced flexibility, muscular endurance and strength shown through measurements of functional capacity and personal statement of participant.       Able to understand and use rate of perceived exertion (RPE) scale  Yes       Intervention  Provide education and explanation on how to use RPE scale       Expected Outcomes  Long Term:  Able to use RPE to guide intensity level when exercising independently;Short Term: Able to use RPE daily in rehab to express subjective intensity level       Knowledge and understanding of Target Heart Rate Range (THRR)  Yes       Intervention  Provide education and explanation of THRR  including how the numbers were predicted and where they are located for reference       Expected Outcomes  Short Term: Able to state/look up THRR;Long Term: Able to use THRR to govern intensity when exercising independently;Short Term: Able to use daily as guideline for intensity in rehab       Able to check pulse independently  Yes       Intervention  Provide education and demonstration on how to check pulse in carotid and radial arteries.;Review the importance of being able to check your own pulse for safety during independent exercise       Expected Outcomes  Short Term: Able to explain why pulse checking is important during independent exercise;Long Term: Able to check pulse independently and accurately       Understanding of Exercise Prescription  Yes       Intervention  Provide education, explanation, and written materials on patient's individual exercise prescription       Expected Outcomes  Short Term: Able to explain program exercise prescription;Long Term: Able to explain home exercise prescription to exercise independently          Exercise Goals Re-Evaluation : Exercise Goals Re-Evaluation    Row Name 10/18/17 1422 10/23/17 1748  11/01/17 1522 11/15/17 1540       Exercise Goal Re-Evaluation   Exercise Goals Review  Increase Physical Activity;Able to understand and use rate of perceived exertion (RPE) scale;Increase Strength and Stamina;Knowledge and understanding of Target Heart Rate Range (THRR)  Increase Physical Activity;Increase Strength and Stamina  Increase Physical Activity;Increase Strength and Stamina;Able to understand and use Dyspnea scale;Able to understand and use rate of perceived exertion (RPE) scale;Knowledge and understanding of Target Heart Rate Range (THRR)  Increase Physical Activity;Able to understand and use rate of perceived exertion (RPE) scale;Knowledge and understanding of Target Heart Rate Range (THRR)    Comments  Pt is progressing well with exercise and has increased levels on Elliptical and TM  Home exercise guidelines reviewed today. Patient demonstrated understanding of guidelines. He plans to walk 1-2 extra days outside of class.   JT has increased average MET level to 4.8.  Staff will continue to monitor  JT has progressed to 6.8 METS.  He will finish HT in 3 sessions    Expected Outcomes  Short - Pt will continue to attend regularly Long - PT will improve overall MET levels  -  Short - JT will continue to progress workloads and work in [place] will maintain fitness on his own  Short - JT will finish HT Long - JT will maintain fitness on his own       Discharge Exercise Prescription (Final Exercise Prescription Changes): Exercise Prescription Changes - 11/15/17 1500      Response to Exercise   Blood Pressure (Admit)  142/70    Blood Pressure (Exit)  120/60    Heart Rate (Admit)  136 bpm    Heart Rate (Exercise)  165 bpm    Heart Rate (Exit)  90 bpm    Rating of Perceived Exertion (Exercise)  15    Symptoms  none    Duration  Continue with 45 min of aerobic exercise without signs/symptoms of physical distress.    Intensity  THRR unchanged      Progression   Progression  Continue  to progress workloads to maintain intensity without signs/symptoms of physical distress.    Average METs  6.8      Resistance Training   Training  Prescription  Yes    Weight  4 lb    Reps  10-15      Interval Training   Interval Training  No      Treadmill   MPH  5.5    Grade  1.5    Minutes  15    METs  5.97      Recumbant Elliptical   Level  4    RPM  50    Minutes  15    METs  7.6      Home Exercise Plan   Plans to continue exercise at  Home (comment) walking at home    Frequency  Add 2 additional days to program exercise sessions.    Initial Home Exercises Provided  10/23/17       Nutrition:  Target Goals: Understanding of nutrition guidelines, daily intake of sodium '1500mg'$ , cholesterol '200mg'$ , calories 30% from fat and 7% or less from saturated fats, daily to have 5 or more servings of fruits and vegetables.  Biometrics: Pre Biometrics - 09/25/17 1420      Pre Biometrics   Height  5' 7.5" (1.715 m)    Weight  210 lb 12.8 oz (95.6 kg)    Waist Circumference  39 inches    Hip Circumference  42 inches    Waist to Hip Ratio  0.93 %    BMI (Calculated)  32.51    Single Leg Stand  24.5 seconds      Post Biometrics - 11/15/17 1806       Post  Biometrics   Height  5' 7.5" (1.715 m)    Weight  212 lb (96.2 kg)    Waist Circumference  40 inches    Hip Circumference  42.5 inches    Waist to Hip Ratio  0.94 %    BMI (Calculated)  32.69    Single Leg Stand  30 seconds       Nutrition Therapy Plan and Nutrition Goals: Nutrition Therapy & Goals - 09/25/17 1118      Intervention Plan   Intervention  Prescribe, educate and counsel regarding individualized specific dietary modifications aiming towards targeted core components such as weight, hypertension, lipid management, diabetes, heart failure and other comorbidities.;Nutrition handout(s) given to patient.    Expected Outcomes  Short Term Goal: Understand basic principles of dietary content, such as calories,  fat, sodium, cholesterol and nutrients.;Short Term Goal: A plan has been developed with personal nutrition goals set during dietitian appointment.;Long Term Goal: Adherence to prescribed nutrition plan.       Nutrition Assessments: Nutrition Assessments - 11/13/17 1647      MEDFICTS Scores   Post Score  12       Nutrition Goals Re-Evaluation:   Nutrition Goals Discharge (Final Nutrition Goals Re-Evaluation):   Psychosocial: Target Goals: Acknowledge presence or absence of significant depression and/or stress, maximize coping skills, provide positive support system. Participant is able to verbalize types and ability to use techniques and skills needed for reducing stress and depression.   Initial Review & Psychosocial Screening: Initial Psych Review & Screening - 09/25/17 1122      Initial Review   Current issues with  Current Stress Concerns;Current Sleep Concerns    Source of Stress Concerns  Occupation    Comments  JT stated that his job stress is a lot to handle at times. It is about the same compared to before his heart attack. He also revealed that it is very hard for him to get  to sleep with his CPAP, but that he does wear it all night.       Family Dynamics   Good Support System?  Yes Wife, mother, brother, sister, and friends      Screening Interventions   Interventions  Yes;Encouraged to exercise;Program counselor consult    Expected Outcomes  Short Term goal: Utilizing psychosocial counselor, staff and physician to assist with identification of specific Stressors or current issues interfering with healing process. Setting desired goal for each stressor or current issue identified.;Long Term Goal: Stressors or current issues are controlled or eliminated.;Short Term goal: Identification and review with participant of any Quality of Life or Depression concerns found by scoring the questionnaire.;Long Term goal: The participant improves quality of Life and PHQ9 Scores as seen  by post scores and/or verbalization of changes       Quality of Life Scores:  Quality of Life - 11/13/17 1646      Quality of Life Scores   Health/Function Post  26 %    Socioeconomic Post  29.25 %    Psych/Spiritual Post  29.14 %    Family Post  28.8 %    GLOBAL Post  27.77 %      Scores of 19 and below usually indicate a poorer quality of life in these areas.  A difference of  2-3 points is a clinically meaningful difference.  A difference of 2-3 points in the total score of the Quality of Life Index has been associated with significant improvement in overall quality of life, self-image, physical symptoms, and general health in studies assessing change in quality of life.  PHQ-9: Recent Review Flowsheet Data    Depression screen [facility] 2/9 11/13/2017 09/25/2017   Decreased Interest 0 0   Down, Depressed, Hopeless 0 0   PHQ - 2 Score 0 0   Altered sleeping 3 2   Tired, decreased energy 0 1   Change in appetite 0 1   Feeling bad or failure about yourself  0 0   Trouble concentrating 0 0   Moving slowly or fidgety/restless 0 1   Suicidal thoughts 0 0   PHQ-9 Score 3 5   Difficult doing work/chores Not difficult at all Not difficult at all     Interpretation of Total Score  Total Score Depression Severity:  1-4 = Minimal depression, 5-9 = Mild depression, 10-14 = Moderate depression, 15-19 = Moderately severe depression, 20-27 = Severe depression   Psychosocial Evaluation and Intervention: Psychosocial Evaluation - 10/02/17 1704      Psychosocial Evaluation & Interventions   Interventions  Encouraged to exercise with the program and follow exercise prescription;Relaxation education;Stress management education    Comments  Counselor met with Mr. Bowne ("JT") today for initial psychosocial evaluation.  JT is a 52 year old who had a heart attack and (2) stents inserted on 1/3.  He has a strong support system with a spouse; many friends locally and a mother who stays in close  contact but lives in [place].  JT reports difficulty getting to sleep most nights, but with the use of his CPAP is able to typically get 6 hours/night.  He has a good appetite.  JT denies a history of depression or anxiety or any current symptoms.  He reports typically being in a positive mood most of the time.  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  He  has goals to change this and "slow down" a bit; lose some weight and strengthen his heart while in this program.  Staff will be following with JT throughout the course of this program.    Expected Outcomes  JT will benefit from consistent exercise to achieve his stated goals.  The educational and psychoeducational components of this program will be beneficial in learning more about his condition and ways to cope with stress more positively.  JT will meet with the dietician to address his weight loss goals.    Continue Psychosocial Services   Follow up required by staff       Psychosocial Re-Evaluation: Psychosocial Re-Evaluation    Jerome Name 11/01/17 1749             Psychosocial Re-Evaluation   Current issues with  Current Stress Concerns       Comments  JT still has work stress, but he has cut back on some of his overtime and his mindset to not let his work consume him has improved. He is sleeping a little bit better at night, still adjusting with his CPAP. His goal to strengthen his heart is being met, and he is excited that his doctor cleared him to play softball starting in March.        Expected Outcomes  Short: continue working on his mindset at work as far as stress. Long: focus on longterm stress reduction post graduation.          Initial Review   Source of Stress Concerns  Occupation          Psychosocial Discharge (Final Psychosocial Re-Evaluation): Psychosocial Re-Evaluation - 11/01/17 1749      Psychosocial Re-Evaluation    Current issues with  Current Stress Concerns    Comments  JT still has work stress, but he has cut back on some of his overtime and his mindset to not let his work consume him has improved. He is sleeping a little bit better at night, still adjusting with his CPAP. His goal to strengthen his heart is being met, and he is excited that his doctor cleared him to play softball starting in March.     Expected Outcomes  Short: continue working on his mindset at work as far as stress. Long: focus on longterm stress reduction post graduation.       Initial Review   Source of Stress Concerns  Occupation       Vocational Rehabilitation: Provide vocational rehab assistance to qualifying candidates.   Vocational Rehab Evaluation & Intervention: Vocational Rehab - 09/25/17 1132      Initial Vocational Rehab Evaluation & Intervention   Assessment shows need for Vocational Rehabilitation  No       Education: Education Goals: Education classes will be provided on a variety of topics geared toward better understanding of heart health and risk factor modification. Participant will state understanding/return demonstration of topics presented as noted by education test scores.  Learning Barriers/Preferences: Learning Barriers/Preferences - 09/25/17 1131      Learning Barriers/Preferences   Learning Barriers  None    Learning Preferences  None       Education Topics:  AED/CPR: - Group verbal and written instruction with the use of models to demonstrate the basic use of the AED with the basic ABC's of resuscitation.   Cardiac Rehab from 11/15/2017 in [facility] Cardiac and Pulmonary Rehab  Date  10/23/17  Educator  MA  Instruction Review Code  1- Verbalizes Understanding  General Nutrition Guidelines/Fats and Fiber: -Group instruction provided by verbal, written material, models and posters to present the general guidelines for heart healthy nutrition. Gives an explanation and review of dietary  fats and fiber.   Cardiac Rehab from 11/15/2017 in [facility] Cardiac and Pulmonary Rehab  Date  10/09/17  Educator  PI  Instruction Review Code  1- Verbalizes Understanding      Controlling Sodium/Reading Food Labels: -Group verbal and written material supporting the discussion of sodium use in heart healthy nutrition. Review and explanation with models, verbal and written materials for utilization of the food label.   Cardiac Rehab from 11/15/2017 in [facility] Cardiac and Pulmonary Rehab  Date  10/16/17  Educator  PI  Instruction Review Code  1- Verbalizes Understanding      Exercise Physiology & General Exercise Guidelines: - Group verbal and written instruction with models to review the exercise physiology of the cardiovascular system and associated critical values. Provides general exercise guidelines with specific guidelines to those with heart or lung disease.    Cardiac Rehab from 11/15/2017 in [facility] Cardiac and Pulmonary Rehab  Date  10/30/17  Educator  [facility]  Instruction Review Code  1- Verbalizes Understanding      Aerobic Exercise & Resistance Training: - Gives group verbal and written instruction on the various components of exercise. Focuses on aerobic and resistive training programs and the benefits of this training and how to safely progress through these programs..   Cardiac Rehab from 11/15/2017 in [facility] Cardiac and Pulmonary Rehab  Date  11/06/17  Educator  AS  Instruction Review Code  1- Verbalizes Understanding      Flexibility, Balance, Mind/Body Relaxation: Provides group verbal/written instruction on the benefits of flexibility and balance training, including mind/body exercise modes such as yoga, pilates and tai chi.  Demonstration and skill practice provided.   Cardiac Rehab from 11/15/2017 in [facility] Cardiac and Pulmonary Rehab  Date  11/08/17  Educator  AS  Instruction Review Code  1- Verbalizes Understanding      Stress and Anxiety: - Provides group verbal and written  instruction about the health risks of elevated stress and causes of high stress.  Discuss the correlation between heart/lung disease and anxiety and treatment options. Review healthy ways to manage with stress and anxiety.   Cardiac Rehab from 11/15/2017 in [facility] Cardiac and Pulmonary Rehab  Date  11/15/17  Educator  [facility]  Instruction Review Code  1- Verbalizes Understanding      Depression: - Provides group verbal and written instruction on the correlation between heart/lung disease and depressed mood, treatment options, and the stigmas associated with seeking treatment.   Cardiac Rehab from 11/15/2017 in [facility] Cardiac and Pulmonary Rehab  Date  11/01/17  Educator  [facility]  Instruction Review Code  1- Verbalizes Understanding      Anatomy & Physiology of the Heart: - Group verbal and written instruction and models provide basic cardiac anatomy and physiology, with the coronary electrical and arterial systems. Review of Valvular disease and Heart Failure   Cardiac Rehab from 11/15/2017 in [facility] Cardiac and Pulmonary Rehab  Date  10/02/17  Educator  CE  Instruction Review Code  1- Verbalizes Understanding      Cardiac Procedures: - Group verbal and written instruction to review commonly prescribed medications for heart disease. Reviews the medication, class of the drug, and side effects. Includes the steps to properly store meds and maintain the prescription regimen. (beta blockers and nitrates)   Cardiac Rehab from 11/15/2017 in  [place] Cardiac and Pulmonary Rehab  Date  10/11/17  Educator  [facility]  Instruction Review Code  1- Verbalizes Understanding      Cardiac Medications I: - Group verbal and written instruction to review commonly prescribed medications for heart disease. Reviews the medication, class of the drug, and side effects. Includes the steps to properly store meds and maintain the prescription regimen.   Cardiac Medications II: -Group verbal and written instruction to review commonly  prescribed medications for heart disease. Reviews the medication, class of the drug, and side effects. (all other drug classes)   Cardiac Rehab from 11/15/2017 in [facility] Cardiac and Pulmonary Rehab  Date  11/13/17  Educator  CE  Instruction Review Code  1- Verbalizes Understanding       Go Sex-Intimacy & Heart Disease, Get SMART - Goal Setting: - Group verbal and written instruction through game format to discuss heart disease and the return to sexual intimacy. Provides group verbal and written material to discuss and apply goal setting through the application of the S.M.A.R.T. Method.   Cardiac Rehab from 11/15/2017 in [facility] Cardiac and Pulmonary Rehab  Date  10/11/17  Educator  [facility]  Instruction Review Code  1- Verbalizes Understanding      Other Matters of the Heart: - Provides group verbal, written materials and models to describe Stable Angina and Peripheral Artery. Includes description of the disease process and treatment options available to the cardiac patient.   Exercise & Equipment Safety: - Individual verbal instruction and demonstration of equipment use and safety with use of the equipment.   Cardiac Rehab from 11/15/2017 in [facility] Cardiac and Pulmonary Rehab  Date  09/25/17  Educator  [facility]  Instruction Review Code  1- Verbalizes Understanding      Infection Prevention: - Provides verbal and written material to individual with discussion of infection control including proper hand washing and proper equipment cleaning during exercise session.   Cardiac Rehab from 11/15/2017 in [facility] Cardiac and Pulmonary Rehab  Date  09/25/17  Educator  [facility]  Instruction Review Code  1- Verbalizes Understanding      Falls Prevention: - Provides verbal and written material to individual with discussion of falls prevention and safety.   Cardiac Rehab from 11/15/2017 in [facility] Cardiac and Pulmonary Rehab  Date  09/25/17  Educator  [facility]  Instruction Review Code  1- Verbalizes Understanding       Diabetes: - Individual verbal and written instruction to review signs/symptoms of diabetes, desired ranges of glucose level fasting, after meals and with exercise. Acknowledge that pre and post exercise glucose checks will be done for 3 sessions at entry of program.   Know Your Numbers and Risk Factors: -Group verbal and written instruction about important numbers in your health.  Discussion of what are risk factors and how they play a role in the disease process.  Review of Cholesterol, Blood Pressure, Diabetes, and BMI and the role they play in your overall health.   Cardiac Rehab from 11/15/2017 in [facility] Cardiac and Pulmonary Rehab  Date  11/13/17  Educator  CE  Instruction Review Code  1- Verbalizes Understanding      Sleep Hygiene: -Provides group verbal and written instruction about how sleep can affect your health.  Define sleep hygiene, discuss sleep cycles and impact of sleep habits. Review good sleep hygiene tips.    Cardiac Rehab from 11/15/2017 in [facility] Cardiac and Pulmonary Rehab  Date  10/18/17  Educator  [facility]  Instruction Review Code  1- [employer]  Understanding      Other: -Provides group and verbal instruction on various topics (see comments)   Knowledge Questionnaire Score: Knowledge Questionnaire Score - 11/13/17 1646      Knowledge Questionnaire Score   Post Score  25/28       Core Components/Risk Factors/Patient Goals at Admission: Personal Goals and Risk Factors at Admission - 09/25/17 1114      Core Components/Risk Factors/Patient Goals on Admission    Weight Management  Yes;Obesity;Weight Loss    Intervention  Weight Management: Develop a combined nutrition and exercise program designed to reach desired caloric intake, while maintaining appropriate intake of nutrient and fiber, sodium and fats, and appropriate energy expenditure required for the weight goal.;Weight Management: Provide education and appropriate resources to help participant work on and  attain dietary goals.;Weight Management/Obesity: Establish reasonable short term and long term weight goals.;Obesity: Provide education and appropriate resources to help participant work on and attain dietary goals.    Admit Weight  210 lb (95.3 kg)    Goal Weight: Short Term  205 lb (93 kg)    Goal Weight: Long Term  180 lb (81.6 kg)    Expected Outcomes  Short Term: Continue to assess and modify interventions until short term weight is achieved;Long Term: Adherence to nutrition and physical activity/exercise program aimed toward attainment of established weight goal;Weight Loss: Understanding of general recommendations for a balanced deficit meal plan, which promotes 1-2 lb weight loss per week and includes a negative energy balance of [phone] kcal/d;Understanding recommendations for meals to include 15-35% energy as protein, 25-35% energy from fat, 35-60% energy from carbohydrates, less than '200mg'$  of dietary cholesterol, 20-35 gm of total fiber daily;Understanding of distribution of calorie intake throughout the day with the consumption of 4-5 meals/snacks    Lipids  Yes    Intervention  Provide education and support for participant on nutrition & aerobic/resistive exercise along with prescribed medications to achieve LDL '70mg'$ , HDL >'40mg'$ .    Expected Outcomes  Short Term: Participant states understanding of desired cholesterol values and is compliant with medications prescribed. Participant is following exercise prescription and nutrition guidelines.;Long Term: Cholesterol controlled with medications as prescribed, with individualized exercise RX and with personalized nutrition plan. Value goals: LDL < '70mg'$ , HDL > 40 mg.    Stress  Yes Job stress    Intervention  Offer individual and/or small group education and counseling on adjustment to heart disease, stress management and health-related lifestyle change. Teach and support self-help strategies.;Refer participants experiencing significant  psychosocial distress to appropriate mental health specialists for further evaluation and treatment. When possible, include family members and significant others in education/counseling sessions.    Expected Outcomes  Short Term: Participant demonstrates changes in health-related behavior, relaxation and other stress management skills, ability to obtain effective social support, and compliance with psychotropic medications if prescribed.;Long Term: Emotional wellbeing is indicated by absence of clinically significant psychosocial distress or social isolation.       Core Components/Risk Factors/Patient Goals Review:  Goals and Risk Factor Review    Row Name 11/01/17 1746             Core Components/Risk Factors/Patient Goals Review   Personal Goals Review  Weight Management/Obesity;Hypertension;Lipids;Stress       Review  JT has noticed an improvement in his stress level since starting [facility]. He attributes this to exercise and to being more intentional about cutting back at work. His blood pressure has decreased, so much so his doctor recently had to decrease his  medication. He feels like his cholesterol is getting better due to excerise and making healthier eating choices. He does report that his weight has not really changed much, even though he is trying.        Expected Outcomes  Short: JT will continue these lifestyle changes he has made during [facility] and continue to come to class. Long: JT will start to lose weight per his weight loss plan.           Core Components/Risk Factors/Patient Goals at Discharge (Final Review):  Goals and Risk Factor Review - 11/01/17 1746      Core Components/Risk Factors/Patient Goals Review   Personal Goals Review  Weight Management/Obesity;Hypertension;Lipids;Stress    Review  JT has noticed an improvement in his stress level since starting [facility]. He attributes this to exercise and to being more intentional about cutting back at work. His  blood pressure has decreased, so much so his doctor recently had to decrease his medication. He feels like his cholesterol is getting better due to excerise and making healthier eating choices. He does report that his weight has not really changed much, even though he is trying.     Expected Outcomes  Short: JT will continue these lifestyle changes he has made during [facility] and continue to come to class. Long: JT will start to lose weight per his weight loss plan.        ITP Comments: ITP Comments    Row Name 09/25/17 1109 10/11/17 0644 11/08/17 0610       ITP Comments  Med Review completed. Initial ITP created. Diagnosis can be found in [facility] encounter 09/15/17  30 Day review. Continue with ITP unless directed changes per Medical Director review.  New to program  30 day review. Continue with ITP unless directed changes per Medical Director review.        Comments: Discharge ITP

## 2017-11-25 ENCOUNTER — Other Ambulatory Visit: Payer: Self-pay

## 2017-11-25 ENCOUNTER — Encounter: Payer: Self-pay | Admitting: Emergency Medicine

## 2017-11-25 ENCOUNTER — Emergency Department: Payer: BLUE CROSS/BLUE SHIELD

## 2017-11-25 ENCOUNTER — Inpatient Hospital Stay
Admission: EM | Admit: 2017-11-25 | Discharge: 2017-11-27 | DRG: 313 | Disposition: A | Discharge status: Home / Self Care | Payer: BLUE CROSS/BLUE SHIELD | Attending: Internal Medicine | Admitting: Internal Medicine

## 2017-11-25 DIAGNOSIS — Z79890 Hormone replacement therapy (postmenopausal): Secondary | ICD-10-CM

## 2017-11-25 DIAGNOSIS — A0811 Acute gastroenteropathy due to Norwalk agent: Secondary | ICD-10-CM | POA: Diagnosis not present

## 2017-11-25 DIAGNOSIS — E039 Hypothyroidism, unspecified: Secondary | ICD-10-CM | POA: Diagnosis present

## 2017-11-25 DIAGNOSIS — Z91018 Allergy to other foods: Secondary | ICD-10-CM

## 2017-11-25 DIAGNOSIS — Z886 Allergy status to analgesic agent status: Secondary | ICD-10-CM | POA: Diagnosis not present

## 2017-11-25 DIAGNOSIS — R079 Chest pain, unspecified: Secondary | ICD-10-CM | POA: Diagnosis not present

## 2017-11-25 DIAGNOSIS — R05 Cough: Secondary | ICD-10-CM | POA: Diagnosis present

## 2017-11-25 DIAGNOSIS — R0789 Other chest pain: Principal | ICD-10-CM | POA: Diagnosis present

## 2017-11-25 DIAGNOSIS — J45909 Unspecified asthma, uncomplicated: Secondary | ICD-10-CM | POA: Diagnosis present

## 2017-11-25 DIAGNOSIS — R51 Headache: Secondary | ICD-10-CM | POA: Diagnosis not present

## 2017-11-25 DIAGNOSIS — I2510 Atherosclerotic heart disease of native coronary artery without angina pectoris: Secondary | ICD-10-CM | POA: Diagnosis present

## 2017-11-25 DIAGNOSIS — E785 Hyperlipidemia, unspecified: Secondary | ICD-10-CM | POA: Diagnosis present

## 2017-11-25 DIAGNOSIS — Z8349 Family history of other endocrine, nutritional and metabolic diseases: Secondary | ICD-10-CM | POA: Diagnosis not present

## 2017-11-25 DIAGNOSIS — I200 Unstable angina: Secondary | ICD-10-CM

## 2017-11-25 DIAGNOSIS — Z955 Presence of coronary angioplasty implant and graft: Secondary | ICD-10-CM | POA: Diagnosis not present

## 2017-11-25 DIAGNOSIS — R9431 Abnormal electrocardiogram [ECG] [EKG]: Secondary | ICD-10-CM | POA: Diagnosis not present

## 2017-11-25 DIAGNOSIS — I25118 Atherosclerotic heart disease of native coronary artery with other forms of angina pectoris: Secondary | ICD-10-CM | POA: Diagnosis not present

## 2017-11-25 DIAGNOSIS — Z8701 Personal history of pneumonia (recurrent): Secondary | ICD-10-CM

## 2017-11-25 DIAGNOSIS — Z885 Allergy status to narcotic agent status: Secondary | ICD-10-CM | POA: Diagnosis not present

## 2017-11-25 DIAGNOSIS — I959 Hypotension, unspecified: Secondary | ICD-10-CM | POA: Diagnosis present

## 2017-11-25 DIAGNOSIS — R251 Tremor, unspecified: Secondary | ICD-10-CM | POA: Diagnosis not present

## 2017-11-25 DIAGNOSIS — Z7902 Long term (current) use of antithrombotics/antiplatelets: Secondary | ICD-10-CM | POA: Diagnosis not present

## 2017-11-25 DIAGNOSIS — G4733 Obstructive sleep apnea (adult) (pediatric): Secondary | ICD-10-CM | POA: Diagnosis present

## 2017-11-25 DIAGNOSIS — I361 Nonrheumatic tricuspid (valve) insufficiency: Secondary | ICD-10-CM | POA: Diagnosis not present

## 2017-11-25 DIAGNOSIS — Z87891 Personal history of nicotine dependence: Secondary | ICD-10-CM | POA: Diagnosis not present

## 2017-11-25 DIAGNOSIS — I252 Old myocardial infarction: Secondary | ICD-10-CM

## 2017-11-25 DIAGNOSIS — I10 Essential (primary) hypertension: Secondary | ICD-10-CM | POA: Diagnosis present

## 2017-11-25 DIAGNOSIS — R519 Headache, unspecified: Secondary | ICD-10-CM

## 2017-11-25 LAB — BASIC METABOLIC PANEL
ANION GAP: 9 (ref 5–15)
BUN: 21 mg/dL — ABNORMAL HIGH (ref 6–20)
CHLORIDE: 109 mmol/L (ref 101–111)
CO2: 21 mmol/L — AB (ref 22–32)
Calcium: 9.1 mg/dL (ref 8.9–10.3)
Creatinine, Ser: 0.81 mg/dL (ref 0.61–1.24)
GFR calc non Af Amer: >60 mL/min (ref >60)
Glucose, Bld: 145 mg/dL — ABNORMAL HIGH (ref 65–99)
POTASSIUM: 3.7 mmol/L (ref 3.5–5.1)
SODIUM: 139 mmol/L (ref 135–145)

## 2017-11-25 LAB — CBC
HEMATOCRIT: 48.7 % (ref 40.0–52.0)
HEMATOCRIT: 45.9 % (ref 40.0–52.0)
HEMOGLOBIN: 16.6 g/dL (ref 13.0–18.0)
HEMOGLOBIN: 15.8 g/dL (ref 13.0–18.0)
MCH: 28.9 pg (ref 26.0–34.0)
MCH: 29.6 pg (ref 26.0–34.0)
MCHC: 34.1 g/dL (ref 32.0–36.0)
MCHC: 34.4 g/dL (ref 32.0–36.0)
MCV: 84.8 fL (ref 80.0–100.0)
MCV: 85.8 fL (ref 80.0–100.0)
Platelets: 188 10*3/uL (ref 150–440)
Platelets: 185 10*3/uL (ref 150–440)
RBC: 5.74 MIL/uL (ref 4.40–5.90)
RBC: 5.35 MIL/uL (ref 4.40–5.90)
RDW: 14.5 % (ref 11.5–14.5)
RDW: 14.8 % — ABNORMAL HIGH (ref 11.5–14.5)
WBC: 8.7 10*3/uL (ref 3.8–10.6)
WBC: 8.3 10*3/uL (ref 3.8–10.6)

## 2017-11-25 LAB — GLUCOSE, CAPILLARY: Glucose-Capillary: 116 mg/dL — ABNORMAL HIGH (ref 65–99)

## 2017-11-25 LAB — TROPONIN I
Troponin I: <0.03 ng/mL (ref <0.03)
Troponin I: <0.03 ng/mL (ref <0.03)

## 2017-11-25 LAB — HEPARIN LEVEL (UNFRACTIONATED): HEPARIN UNFRACTIONATED: 0.37 [IU]/mL (ref 0.30–0.70)

## 2017-11-25 LAB — SEDIMENTATION RATE: SED RATE: 4 mm/h (ref 0–15)

## 2017-11-25 LAB — PROTIME-INR
INR: 1.21
PROTHROMBIN TIME: 15.2 s (ref 11.4–15.2)

## 2017-11-25 LAB — INFLUENZA PANEL BY PCR (TYPE A & B)
INFLAPCR: NEGATIVE
Influenza B By PCR: NEGATIVE

## 2017-11-25 LAB — MRSA PCR SCREENING: MRSA BY PCR: NEGATIVE

## 2017-11-25 LAB — TSH: TSH: 0.37 u[IU]/mL (ref 0.350–4.500)

## 2017-11-25 LAB — APTT: aPTT: 138 seconds — ABNORMAL HIGH (ref 24–36)

## 2017-11-25 MED ORDER — NITROGLYCERIN IN D5W 200-5 MCG/ML-% IV SOLN
0.0000 ug/min | INTRAVENOUS | Status: DC
  Filled 2017-11-25 (×2): qty 250

## 2017-11-25 MED ORDER — BISACODYL 10 MG RE SUPP: 10.0000 mg | Freq: Every day | RECTAL | Status: DC | PRN

## 2017-11-25 MED ORDER — DOCUSATE SODIUM 100 MG PO CAPS
100.0000 mg | ORAL_CAPSULE | Freq: Two times a day (BID) | ORAL | Status: DC
  Filled 2017-11-25: qty 1

## 2017-11-25 MED ORDER — ACETAMINOPHEN 650 MG RE SUPP: 650.0000 mg | Freq: Four times a day (QID) | RECTAL | Status: DC | PRN

## 2017-11-25 MED ORDER — LEVOTHYROXINE SODIUM 25 MCG PO TABS
125.0000 ug | ORAL_TABLET | Freq: Every day | ORAL | Status: DC
  Administered 2017-11-26 – 2017-11-27 (×2): 125 ug via ORAL
  Filled 2017-11-25 (×3): qty 1

## 2017-11-25 MED ORDER — IOPAMIDOL (ISOVUE-370) INJECTION 76%
100.0000 mL | Freq: Once | INTRAVENOUS | Status: AC | PRN
  Administered 2017-11-25: 100 mL via INTRAVENOUS

## 2017-11-25 MED ORDER — PANTOPRAZOLE SODIUM 40 MG IV SOLR
40.0000 mg | Freq: Two times a day (BID) | INTRAVENOUS | Status: DC
  Administered 2017-11-25 – 2017-11-26 (×4): 40 mg via INTRAVENOUS
  Filled 2017-11-25 (×5): qty 40

## 2017-11-25 MED ORDER — METOPROLOL SUCCINATE ER 50 MG PO TB24
25.0000 mg | ORAL_TABLET | Freq: Every day | ORAL | Status: DC
Start: 2017-11-25 — End: 2017-11-26
  Filled 2017-11-25: qty 1

## 2017-11-25 MED ORDER — ONDANSETRON HCL 4 MG/2ML IJ SOLN
4.0000 mg | Freq: Four times a day (QID) | INTRAMUSCULAR | Status: DC | PRN
Start: 2017-11-25 — End: 2017-11-27

## 2017-11-25 MED ORDER — HEPARIN (PORCINE) IN NACL 100-0.45 UNIT/ML-% IJ SOLN
1150.0000 [IU]/h | INTRAMUSCULAR | Status: DC
  Administered 2017-11-25 – 2017-11-26 (×2): 1150 [IU]/h via INTRAVENOUS
  Filled 2017-11-25 (×2): qty 250

## 2017-11-25 MED ORDER — NITROGLYCERIN 0.4 MG SL SUBL
0.4000 mg | SUBLINGUAL_TABLET | SUBLINGUAL | Status: DC | PRN
  Administered 2017-11-25: 0.4 mg via SUBLINGUAL
  Filled 2017-11-25: qty 1

## 2017-11-25 MED ORDER — HYDROCODONE-ACETAMINOPHEN 5-325 MG PO TABS
1.0000 | ORAL_TABLET | Freq: Four times a day (QID) | ORAL | Status: DC | PRN
  Administered 2017-11-25: 1 via ORAL
  Filled 2017-11-25: qty 1

## 2017-11-25 MED ORDER — ONDANSETRON HCL 4 MG PO TABS: 4.0000 mg | ORAL_TABLET | Freq: Four times a day (QID) | ORAL | Status: DC | PRN

## 2017-11-25 MED ORDER — POTASSIUM CHLORIDE IN NACL 20-0.9 MEQ/L-% IV SOLN
INTRAVENOUS | Status: DC
  Administered 2017-11-25 – 2017-11-26 (×3): via INTRAVENOUS
  Filled 2017-11-25 (×8): qty 1000

## 2017-11-25 MED ORDER — LORAZEPAM 2 MG/ML IJ SOLN
0.5000 mg | INTRAMUSCULAR | Status: DC | PRN
  Administered 2017-11-25 – 2017-11-26 (×2): 0.5 mg via INTRAVENOUS
  Filled 2017-11-25 (×2): qty 1

## 2017-11-25 MED ORDER — CLOPIDOGREL BISULFATE 75 MG PO TABS
75.0000 mg | ORAL_TABLET | Freq: Every day | ORAL | Status: DC
  Administered 2017-11-26 – 2017-11-27 (×3): 75 mg via ORAL
  Filled 2017-11-25 (×3): qty 1

## 2017-11-25 MED ORDER — ATORVASTATIN CALCIUM 20 MG PO TABS
80.0000 mg | ORAL_TABLET | Freq: Every day | ORAL | Status: DC
  Administered 2017-11-25 – 2017-11-26 (×2): 80 mg via ORAL
  Filled 2017-11-25 (×2): qty 4

## 2017-11-25 MED ORDER — HEPARIN BOLUS VIA INFUSION
4000.0000 [IU] | Freq: Once | INTRAVENOUS | Status: AC
  Administered 2017-11-25: 4000 [IU] via INTRAVENOUS
  Filled 2017-11-25: qty 4000

## 2017-11-25 MED ORDER — NITROGLYCERIN 0.4 MG SL SUBL: 0.4000 mg | SUBLINGUAL_TABLET | SUBLINGUAL | Status: DC | PRN

## 2017-11-25 MED ORDER — ACETAMINOPHEN 325 MG PO TABS
650.0000 mg | ORAL_TABLET | Freq: Four times a day (QID) | ORAL | Status: DC | PRN
  Administered 2017-11-25: 650 mg via ORAL
  Filled 2017-11-25: qty 2

## 2017-11-25 MED ORDER — INSULIN ASPART 100 UNIT/ML ~~LOC~~ SOLN
0.0000 [IU] | Freq: Three times a day (TID) | SUBCUTANEOUS | Status: DC
  Filled 2017-11-25: qty 1

## 2017-11-25 MED ORDER — LOPERAMIDE HCL 2 MG PO CAPS
2.0000 mg | ORAL_CAPSULE | ORAL | Status: DC | PRN
Start: 2017-11-25 — End: 2017-11-26
  Administered 2017-11-25 (×2): 2 mg via ORAL
  Filled 2017-11-25 (×2): qty 1

## 2017-11-25 NOTE — ED Notes (Signed)
Pts family called out and wanted to speak with the doctor. The nurse was advised that the pts wife was upset because the doctor had not been in.

## 2017-11-25 NOTE — ED Notes (Signed)
Dr. Doy Hutching aware of patient's blood pressure and agreed to hold Nitro drip at this time.

## 2017-11-25 NOTE — Progress Notes (Signed)
Pt complaining of left sided, non-radiating chest pain post coughing multiple times.  When asked, pt stated that his chest hurts mainly when he coughs.   He is also complaining of lower back pain.  He states that he does not take any medication for it at home.

## 2017-11-25 NOTE — ED Notes (Signed)
Pt reports chest pain and weakness that has gotten worse since he first arrived to ED. Repeat EKG taken and showed to EDP.

## 2017-11-25 NOTE — Progress Notes (Signed)
Hinton Dyer, NP at bedside to address family concerns.

## 2017-11-25 NOTE — Consult Note (Signed)
[place] [facility] Consultation   ASSESSMENT/PLAN   Chest pain. Rule out primary cardiac event. Patient does have a prior history of coronary artery disease, status post stenting, on Plavix alone. EKG shows some inferior wall changes, compared to prior EKG in January not significantly changed.troponin is negative thus far. Cardiology has been notified. Patient is presently on a statin, beta blocker, anticoagulation and nitroglycerin.  Patient has a multitude of neurologic complaints to include right lower extremity numbness, head bobbing and headaches. CT scan of the head was negative, no clear medication that he is on should produce. Unclear etiology of head bobbing, no aortic murmur appreciated,CT scan does not show any aortic dissection or aneurysmal dilation. Pending neurology input  History of obstructive sleep apnea uses home CPAP      INTAKE / OUTPUT: No intake or output data in the 24 hours ending 11/25/17 1529  Name: Erik Bush MRN: [id number] DOB: 03-25-1966    ADMISSION DATE:  11/25/2017 CONSULTATION DATE:  11/25/2017  REFERRING MD :  Dr. Doy Hutching  CHIEF COMPLAINT:  Chest pain, palpitations and shortness of breath  HISTORY OF PRESENT ILLNESS:  Erik Bush is a 52 year old gentleman with a past medical history remarkable for hypothyroidism, hypertension, obstructive sleep apnea presently on CPAP, asthma, coronary artery disease, status post PCI with stent placement, has been on Plavix alone secondary to aspirin allergy, awoke this morning at 5 AM with complaints of left-sided chest pain, palpitations and shortness of breath. Also complained of a headache along with a head bobbing movement. He initially went to work and then was feeling poorly and subsequently brought into the emergency department. Initial troponin has been normal, EKG showed inferior wall changes. He continues to have intermittent chest pain, shortness of breath, headache and palpitations.  Presently he states his chest pain is feeling better.He is on heparin infusion and was on a nitroglycerin infusion which was stopped secondary to borderline blood pressure and headache.  PAST MEDICAL HISTORY :  Past Medical History:  Diagnosis Date  . Asthma   . Coronary artery disease   . Hypothermia   . Hypothyroidism   . Pneumonia 2016  . Sleep apnea    USES CPAP  . Wears contact lenses    Past Surgical History:  Procedure Laterality Date  . ANAL FISTULOTOMY N/A 06/08/2015   Procedure: ANAL FISTULOTOMY;  Surgeon: Christene Lye, MD;  Location: [facility] ORS;  Service: General;  Laterality: N/A;  . ANTERIOR CRUCIATE LIGAMENT REPAIR     knees  . CLOSED REDUCTION NASAL FRACTURE N/A 03/22/2016   Procedure: CLOSED REDUCTION NASAL FRACTURE;  Surgeon: Clyde Canterbury, MD;  Location: [place];  Service: ENT;  Laterality: N/A;  sleep apnea  . LEFT HEART CATH AND CORONARY ANGIOGRAPHY N/A 09/14/2017   Procedure: LEFT HEART CATH AND CORONARY ANGIOGRAPHY and possible PCI;  Surgeon: Isaias Cowman, MD;  Location: [place] CV LAB;  Service: Cardiovascular;  Laterality: N/A;  . LIPOMA EXCISION N/A 04/05/2017   Procedure: EXCISION LIPOMA Nuchal area.  prone position;  Surgeon: Jules Husbands, MD;  Location: [facility] ORS;  Service: General;  Laterality: N/A;  Prone  . MEDIAL COLLATERAL LIGAMENT REPAIR, KNEE    . SHOULDER ARTHROSCOPY Right    Prior to Admission medications   Medication Sig Start Date End Date Taking? Authorizing Provider  atorvastatin (LIPITOR) 80 MG tablet Take 1 tablet (80 mg total) by mouth daily at 6 PM. 09/15/17  Yes Fritzi Mandes, MD  clopidogrel (PLAVIX) 75 MG tablet Take  1 tablet (75 mg total) by mouth daily with breakfast. 09/16/17  Yes Fritzi Mandes, MD  levothyroxine (SYNTHROID, LEVOTHROID) 125 MCG tablet Take 125 mcg by mouth daily before breakfast.   Yes [provider]  metoprolol succinate (TOPROL-XL) 25 MG 24 hr tablet Take 1 tablet (25 mg total) by  mouth daily. 09/15/17  Yes Fritzi Mandes, MD  Multiple Vitamins-Minerals (MULTIVITAMIN WITH MINERALS) tablet Take 1 tablet by mouth daily.   Yes [provider]  Vitamin D, Ergocalciferol, (DRISDOL) 50000 units CAPS capsule Take 1 capsule by mouth every 7 (seven) days.  10/06/17  Yes [provider]   Allergies  Allergen Reactions  . Aspirin Itching and Swelling  . Brussels Sprouts [Brassica Oleracea Italica] Swelling  . Other Swelling    OLIVES-SWELLING  . Morphine And Related     Had tingling fingers,chest pain,tightness after dose.    FAMILY HISTORY:  Family History  Problem Relation Age of Onset  . Thyroid disease Father   . Heart attack Unknown    SOCIAL HISTORY:  reports that he quit smoking about 27 years ago. His smoking use included cigarettes. He has a 12.00 pack-year smoking history. he has never used smokeless tobacco. He reports that he drinks alcohol. He reports that he does not use drugs.  REVIEW OF SYSTEMS:     The remainder of systems were reviewed and were found to be negative other than what is documented in the HPI.    VITAL SIGNS: Temp:  [98.1 F (36.7 C)-98.3 F (36.8 C)] 98.1 F (36.7 C) (03/16 1440) Pulse Rate:  [92-110] 93 (03/16 1440) Resp:  [14-25] 23 (03/16 1440) BP: (98-126)/(60-80) 106/60 (03/16 1440) SpO2:  [92 %-99 %] 99 % (03/16 1440) Weight:  [94 kg (207 lb 3.7 oz)-95.3 kg (210 lb)] 94 kg (207 lb 3.7 oz) (03/16 1440) HEMODYNAMICS:   INTAKE / OUTPUT: No intake or output data in the 24 hours ending 11/25/17 1529  Physical Examination:   VS: BP 106/60   Pulse 93   Temp 98.1 F (36.7 C) (Oral)   Resp (!) 23   Ht 5\' 8"  (1.727 m)   Wt 94 kg (207 lb 3.7 oz)   SpO2 99%   BMI 31.51 kg/m   General Appearance: No distress  Neuro:without focal findings, mental status, speech normal,. HEENT: PERRLA, EOM intact, no ptosis, no other lesions noticed;  Pulmonary: normal breath sounds., diaphragmatic excursion  normal. CardiovascularNormal S1,S2.  No m/r/g.   No murmur appreciated at the aortic post, no carotid bruits appreciated, no femoral bruits or murmurs appreciated Abdomen: Benign, Soft, non-tender, No masses, hepatosplenomegaly, No lymphadenopathy Skin:   warm, no rashes, no ecchymosis  Extremities: normal, no cyanosis, clubbing, no edema, warm with normal capillary refill.   LABS: Reviewed   LABORATORY PANEL:   CBC Recent Labs  Lab 11/25/17 1406  WBC 8.3  HGB 15.8  HCT 45.9  PLT 185    Chemistries  Recent Labs  Lab 11/25/17 1021  NA 139  K 3.7  CL 109  CO2 21*  GLUCOSE 145*  BUN 21*  CREATININE 0.81  CALCIUM 9.1    Recent Labs  Lab 11/25/17 1440  GLUCAP 116*   No results for input(s): PHART, PCO2ART, PO2ART in the last 168 hours. No results for input(s): AST, ALT, ALKPHOS, BILITOT, ALBUMIN in the last 168 hours.  Cardiac Enzymes Recent Labs  Lab 11/25/17 1021  TROPONINI <0.03    RADIOLOGY:  Dg Chest 2 View  Result Date: 11/25/2017 CLINICAL  DATA:  Shortness of breath, weakness and palpitations. EXAM: CHEST - 2 VIEW COMPARISON:  Chest radiograph 09/13/2017. FINDINGS: Monitoring leads overlie the patient. Low lung volumes. Stable cardiac and mediastinal contours. No consolidative pulmonary opacities. No pleural effusion or pneumothorax. Regional skeleton is unremarkable. IMPRESSION: Low lung volumes with basilar atelectasis. Electronically Signed   By: Lovey Newcomer M.D.   On: 11/25/2017 10:57   Ct Head Wo Contrast  Result Date: 11/25/2017 CLINICAL DATA:  52 year old with bilateral lower extremity weakness and palpitations that began this morning. EXAM: CT HEAD WITHOUT CONTRAST TECHNIQUE: Contiguous axial images were obtained from the base of the skull through the vertex without intravenous contrast. COMPARISON:  03/09/2016. FINDINGS: Brain: Ventricular system normal in size and appearance for age. No mass lesion. No midline shift. No acute hemorrhage or  hematoma. No extra-axial fluid collections. No evidence of acute infarction. No focal brain parenchymal abnormalities. Vascular: No hyperdense vessel.  No visible atherosclerosis. Skull: No skull fracture or other focal osseous abnormality involving the skull. Sinuses/Orbits: Visualized paranasal sinuses, bilateral mastoid air cells and bilateral middle ear cavities well-aerated. Visualized orbits and globes normal in appearance. Other: Remote fracture of the medial wall of the left orbit. IMPRESSION: Normal intracranially. Electronically Signed   By: Evangeline Dakin M.D.   On: 11/25/2017 12:16   Ct Angio Chest/abd/pel For Dissection W And/or Wo Contrast  Result Date: 11/25/2017 CLINICAL DATA:  Short of breath EXAM: CT ANGIOGRAPHY CHEST, ABDOMEN AND PELVIS TECHNIQUE: Multidetector CT imaging through the chest, abdomen and pelvis was performed using the standard protocol during bolus administration of intravenous contrast. Multiplanar reconstructed images and MIPs were obtained and reviewed to evaluate the vascular anatomy. CONTRAST:  134mL ISOVUE-370 IOPAMIDOL (ISOVUE-370) INJECTION 76% COMPARISON:  09/13/2017.  03/16/2005. FINDINGS: CTA CHEST FINDINGS Cardiovascular: No evidence of intramural hematoma. No evidence of aortic dissection or aneurysm. Great vessels patent. There is no obvious acute pulmonary thromboembolism. Right coronary artery calcifications are moderately prominent. Mediastinum/Nodes: No abnormal mediastinal adenopathy. No pericardial effusion. Normal thyroid gland. Lungs/Pleura: Low volumes and subsegmental atelectasis in the dependent lungs. Musculoskeletal: No vertebral compression deformity. No acute rib fracture. Review of the MIP images confirms the above findings. CTA ABDOMEN AND PELVIS FINDINGS VASCULAR Aorta: Aorta is nonaneurysmal and patent. There is no evidence of aortic dissection. Celiac: Patent.  Accessory left hepatic artery anatomy. SMA: Patent. Renals: Single right renal  artery is patent. Two left renal arteries are patent. IMA: Patent. Inflow: Common, internal, and external iliac arteries are patent. Review of the MIP images confirms the above findings. NON-VASCULAR Hepatobiliary: Diffuse hepatic steatosis. Gallbladder is within normal limits. Pancreas: Unremarkable Spleen: Unremarkable Adrenals/Urinary Tract: Left adrenal lipoma. Right adrenal gland and kidneys are within normal limits. Bladder is decompressed. Stomach/Bowel: Stomach is distended with enteric contents. Duodenum is within normal limits. There is no evidence of small-bowel obstruction. Normal appendix. No obvious mass in the colon. There is air and fluid throughout the colon. Lymphatic: No abnormal retroperitoneal adenopathy. Reproductive: Prostate is within normal limits. Other: No free fluid. Musculoskeletal: No vertebral compression. Review of the MIP images confirms the above findings. IMPRESSION: No evidence of aortic dissection or acute vascular process. The stomach is distended of unknown significance. There is no evidence of small-bowel obstruction. Electronically Signed   By: Marybelle Killings M.D.   On: 11/25/2017 12:32     Hermelinda Dellen, DO  11/25/2017, 3:29 PM

## 2017-11-25 NOTE — ED Provider Notes (Addendum)
[facility] Department Provider Note  ___________________________________________   First MD Initiated Contact with Patient 11/25/17 1057     (approximate)  I have reviewed the triage vital signs and the nursing notes.   HISTORY  Chief Complaint Shortness of Breath   HPI Erik Bush is a 52 y.o. male  with a history of end STEMI on Plavix who is presenting to the emergency department with 6 hours of shortness of breath as well as left-sided chest pain which she describes a pressure type pain.  He says that the pain is radiating to his back as well as his neck and his head.  He describes diffuse weakness as well as diarrhea.  He says these are the exact same symptoms that he had in January when he had his non-STEMI.  Patient says that he is allergic to aspirin.  Says that he is compliant with his Plavix.   Past Medical History:  Diagnosis Date  . Asthma   . Coronary artery disease   . Hypothermia   . Hypothyroidism   . Pneumonia 2016  . Sleep apnea    USES CPAP  . Wears contact lenses     Patient Active Problem List   Diagnosis Date Noted  . Non-STEMI (non-ST elevated myocardial infarction) ([place]) 09/13/2017  . Neck mass     Past Surgical History:  Procedure Laterality Date  . ANAL FISTULOTOMY N/A 06/08/2015   Procedure: ANAL FISTULOTOMY;  Surgeon: Christene Lye, MD;  Location: [facility];  Service: General;  Laterality: N/A;  . ANTERIOR CRUCIATE LIGAMENT REPAIR     knees  . CLOSED REDUCTION NASAL FRACTURE N/A 03/22/2016   Procedure: CLOSED REDUCTION NASAL FRACTURE;  Surgeon: Clyde Canterbury, MD;  Location: [place];  Service: ENT;  Laterality: N/A;  sleep apnea  . LEFT HEART CATH AND CORONARY ANGIOGRAPHY N/A 09/14/2017   Procedure: LEFT HEART CATH AND CORONARY ANGIOGRAPHY and possible PCI;  Surgeon: Isaias Cowman, MD;  Location: [facility];  Service: Cardiovascular;  Laterality: N/A;  . LIPOMA EXCISION  N/A 04/05/2017   Procedure: EXCISION LIPOMA Nuchal area.  prone position;  Surgeon: Jules Husbands, MD;  Location: [facility];  Service: General;  Laterality: N/A;  Prone  . MEDIAL COLLATERAL LIGAMENT REPAIR, KNEE    . SHOULDER ARTHROSCOPY Right     Prior to Admission medications   Medication Sig Start Date End Date Taking? Authorizing Provider  atorvastatin (LIPITOR) 80 MG tablet Take 1 tablet (80 mg total) by mouth daily at 6 PM. 09/15/17  Yes Fritzi Mandes, MD  clopidogrel (PLAVIX) 75 MG tablet Take 1 tablet (75 mg total) by mouth daily with breakfast. 09/16/17  Yes Fritzi Mandes, MD  levothyroxine (SYNTHROID, LEVOTHROID) 125 MCG tablet Take 125 mcg by mouth daily before breakfast.   Yes [provider]  metoprolol succinate (TOPROL-XL) 25 MG 24 hr tablet Take 1 tablet (25 mg total) by mouth daily. 09/15/17  Yes Fritzi Mandes, MD  Multiple Vitamins-Minerals (MULTIVITAMIN WITH MINERALS) tablet Take 1 tablet by mouth daily.   Yes [provider]  Vitamin D, Ergocalciferol, (DRISDOL) 50000 units CAPS capsule Take 1 capsule by mouth every 7 (seven) days.  10/06/17  Yes [provider]    Allergies Aspirin; Brussels sprouts [brassica oleracea italica]; Other; and Morphine and related  Family History  Problem Relation Age of Onset  . Thyroid disease Father   . Heart attack Unknown     Social History Social History   Tobacco  Use  . Smoking status: Former Smoker    Packs/day: 2.00    Years: 6.00    Pack years: 12.00    Types: Cigarettes    Last attempt to quit: 09/12/1990    Years since quitting: 27.2  . Smokeless tobacco: Never Used  Substance Use Topics  . Alcohol use: Yes    Alcohol/week: 0.0 oz    Comment: occassionally  . Drug use: No    Review of Systems  Constitutional: No fever/chills Eyes: No visual changes. ENT: No sore throat. Cardiovascular: As above Respiratory: As above Gastrointestinal: No abdominal pain.  No nausea, no vomiting.  No diarrhea.  No  constipation. Genitourinary: Negative for dysuria. Musculoskeletal: Negative for back pain. Skin: Negative for rash. Neurological: Negative for headaches, focal weakness or numbness.   ____________________________________________   PHYSICAL EXAM:  VITAL SIGNS: ED Triage Vitals  Enc Vitals Group     BP 11/25/17 1011 102/71     Pulse Rate 11/25/17 1011 (!) 110     Resp 11/25/17 1011 16     Temp 11/25/17 1011 98.3 F (36.8 C)     Temp Source 11/25/17 1011 Oral     SpO2 11/25/17 1011 94 %     Weight 11/25/17 1013 210 lb (95.3 kg)     Height 11/25/17 1013 5\' 7"  (1.702 m)     Head Circumference --      Peak Flow --      Pain Score 11/25/17 1012 8     Pain Loc --      Pain Edu? --      Excl. in [place]? --     Constitutional: Alert and oriented.  Patient appears weak.  Eyes closed and soft speech on exam.  However, is alert and oriented and answers questions appropriately. Eyes: Conjunctivae are normal.  Head: Atraumatic. Nose: No congestion/rhinnorhea. Mouth/Throat: Mucous membranes are moist.  Neck: No stridor.   Cardiovascular: Normal rate, regular rhythm. Grossly normal heart sounds.  Good peripheral circulation with equal and bilateral radial pulses. Respiratory: Normal respiratory effort.  No retractions. Lungs CTAB. Gastrointestinal: Soft and nontender. No distention.  Musculoskeletal: No lower extremity tenderness nor edema.  No joint effusions. Neurologic:  Normal speech and language. No gross focal neurologic deficits are appreciated. Skin:  Skin is warm, dry and intact. No rash noted. Psychiatric: Mood and affect are normal. Speech and behavior are normal.  ____________________________________________   LABS (all labs ordered are listed, but only abnormal results are displayed)  Labs Reviewed  BASIC METABOLIC PANEL - Abnormal; Notable for the following components:      Result Value   CO2 21 (*)    Glucose, Bld 145 (*)    BUN 21 (*)    All other components  within normal limits  CBC  TROPONIN I   ____________________________________________  EKG  ED ECG REPORT I, Doran Stabler, the attending physician, personally viewed and interpreted this ECG.   Date: 11/25/2017  EKG Time: 1009  Rate: 111  Rhythm: sinus tachycardia  Axis: rightward  Intervals:none  ST&T Change: Minimal ST elevation in V2.  T wave inversions in 3 as well as aVF.  No ST depressions.  ED ECG REPORT I, Doran Stabler, the attending physician, personally viewed and interpreted this ECG.   Date: 11/25/2017  EKG Time: 1046  Rate: 105  Rhythm: sinus tachycardia  Axis: Normal axis  Intervals:none  ST&T Change: T wave inversions in 3 and aVF.  Again with minimal elevation in V2.  Deep, biphasic T waves in V3.  ED ECG REPORT I, Doran Stabler, the attending physician, personally viewed and interpreted this ECG.   Date: 11/25/2017  EKG Time: 1119  Rate: 105  Rhythm: Sinus tachycardia  Axis: Normal  Intervals:none  ST&T Change: Borderline ST elevation in V2.  Again with T wave inversions in 3 and aVF.  Again with biphasic T waves in V3.  No change from previous earlier today.  ____________________________________________  RADIOLOGY  Stable cardiac and mediastinal contours.  No acute findings on the CT of the brain.  No aortic pathology noted. ____________________________________________   PROCEDURES  Procedure(s) performed:   .Critical Care Performed by: Orbie Pyo, MD Authorized by: Orbie Pyo, MD   Critical care provider statement:    Critical care time (minutes):  35   Critical care was necessary to treat or prevent imminent or life-threatening deterioration of the following conditions:  Cardiac failure   Critical care was time spent personally by me on the following activities:  Discussions with consultants, development of treatment plan with patient or surrogate, examination of patient, obtaining history from  patient or surrogate, ordering and performing treatments and interventions, ordering and review of laboratory studies, ordering and review of radiographic studies, pulse oximetry, re-evaluation of patient's condition and review of old charts    Critical Care performed:   ____________________________________________   INITIAL IMPRESSION / [place] / ED COURSE  Pertinent labs & imaging results that were available during my care of the patient were reviewed by me and considered in my medical decision making (see chart for details).  Differential diagnosis includes, but is not limited to, ACS, aortic dissection, pulmonary embolism, cardiac tamponade, pneumothorax, pneumonia, pericarditis, myocarditis, GI-related causes including esophagitis/gastritis, and musculoskeletal chest wall pain.   As part of my medical decision making, I reviewed the following data within the electronic MEDICAL RECORD NUMBER Notes from prior ED visits.  Previous cath report.   ----------------------------------------- 12:33 PM on 11/25/2017 -----------------------------------------  Patient at this time received nitro tab and the pain was relieved transiently but he says now it is come back.  I discussed the case with the interventionalist, Dr. Fletcher Anon, who agrees with Dr. Nehemiah Massed, we also spoke to, to treat the patient aggressively as unstable angina/and STEMI with nitroglycerin as well as a heparin drip.  Patient has had 3 EKGs in the emergency department without any significant change between EKGs.  Initial troponin is negative.  Patient will be admitted to the hospital.  Family and patient aware of plan as well as diagnosis.  Signed out to Dr. Doy Hutching. ____________________________________________   FINAL CLINICAL IMPRESSION(S) / ED DIAGNOSES  Unstable angina.    NEW MEDICATIONS STARTED DURING THIS VISIT:  New Prescriptions   No medications on file     Note:  This document was prepared using Dragon  voice recognition software and may include unintentional dictation errors.     Deya Bigos, Randall An, MD 11/25/17 1235  Patient's wife came out of the room and says that the patient is still having chest pain is now shaking his head from side to side.  She is concerned about his cardiac status because she says this is exactly how he presented last time except the head shaking which is new.  The patient is conscious and when I go in the room he is able to answer questions.  The patient was seen by Dr. Doy Hutching but after Dr. Doy Hutching by way to the patient the patient and family decided  they would like to be transferred to [facility] because of concern about the patient only being treated medically with heparin and nitroglycerin.  Also, we are holding the nitroglycerin now because of the patient's borderline blood pressure in the high 90s to low 100s.  The patient continues to be lying on his back.  Giving appropriate answers to questions.  Eyes are closed and he has a mild rhythmic shaking of his head back and forth but no seizure activity to his upper or lower extremities.  No rigidity.  Occasionally stops the shaking motion and then will restart.  I had a discussion with the patient's wife that I had spoken to both cardiologist including the consult cardiologist as well as the STEMI team.  The wife became tearful and says that she is just very concerned about her husband and says that she is experience "trauma" from the last instance where all of his numbers were normal and then he still went to catheterization and they found lesions.  I told the patient and family multiple times that I was willing to transfer them.  However, after this discussion they said that they would rather stay with the team that have cared for them previously.  I called Dr. Doy Hutching back and he will see the patient and ordered an urgent consult so that Dr. Nehemiah Massed should evaluate the patient later today.    Orbie Pyo,  MD 11/25/17 [place], Randall An, MD 12/07/17 [phone]

## 2017-11-25 NOTE — Progress Notes (Signed)
ANTICOAGULATION CONSULT NOTE - Initial Consult  Pharmacy Consult for heparin  Indication: ACS/STEMI   Allergies  Allergen Reactions  . Aspirin Itching and Swelling  . Brussels Sprouts [Brassica Oleracea Italica] Swelling  . Other Swelling    OLIVES-SWELLING  . Morphine And Related     Had tingling fingers,chest pain,tightness after dose.    Patient Measurements: Height: 5\' 8"  (172.7 cm) Weight: 207 lb 3.7 oz (94 kg) IBW/kg (Calculated) : 68.4 Heparin Dosing Weight: 86.4 kg  Vital Signs: Temp: 100.2 F (37.9 C) (03/16 1950) Temp Source: Axillary (03/16 1950) BP: 98/67 (03/16 1900) Pulse Rate: 102 (03/16 1950)  Labs: Recent Labs    11/25/17 1021 11/25/17 1406 11/25/17 1500 11/25/17 1946  HGB 16.6 15.8  --   --   HCT 48.7 45.9  --   --   PLT 188 185  --   --   APTT  --  138*  --   --   LABPROT  --  15.2  --   --   INR  --  1.21  --   --   HEPARINUNFRC  --   --   --  0.37  CREATININE 0.81  --   --   --   TROPONINI <0.03  --  <0.03 <0.03    Estimated Creatinine Clearance: 122.6 mL/min (by C-G formula based on SCr of 0.81 mg/dL).   Medical History: Past Medical History:  Diagnosis Date  . Asthma   . Coronary artery disease   . Hypothermia   . Hypothyroidism   . Pneumonia 2016  . Sleep apnea    USES CPAP  . Wears contact lenses     Assessment: 52 year old male who presents to ED with SOB and left-sided chest pain. PMH STEMI this past Jan. Is on Plavix at home but no anticoagulants.   Goal of Therapy:  Heparin level 0.3-0.7 units/ml Monitor platelets by anticoagulation protocol: Yes   Plan:  Will give heparin bolus 4000 units followed by heparin drip 1150 units/hr.  Will check HL in 6 hrs. Will continue to monitor labs, heparin level and adjust if necessary per protocol.   3/16:  HL @ 20:00 = 0.37 Will continue this pt on current rate and recheck HL on 3/17 @ 0200.   Orene Desanctis, PharmD Pharmacy Resident  11/25/2017,8:21 PM

## 2017-11-25 NOTE — Consult Note (Signed)
[facility] Consultation Note  [name], MRN: [id number], DOB/AGE: October 19, 1965 52 y.o. Admit date: 11/25/2017   Date of Consult: 11/25/2017 Primary Physician: Casilda Carls, MD Primary Cardiologist: Call would  Chief Complaint:  Chief Complaint  Patient presents with  . Shortness of Breath   Reason for Consult: Chest pain  HPI: 52 y.o. male with known coronary artery disease status post non-ST elevation myocardial infarction with a very unusual presentation in January.  The patient had multiple stents in his right coronary artery due to a subtotal occlusion and no apparent collateralization.  The patient had been placed on appropriate medication management and has done very well and finished cardiac rehabilitation.  The patient remains on high intensity cholesterol therapy hypertension medication management and antiplatelet therapy.  He was awakened this morning with severe sharp substernal and left sternal pain radiating into his back and into his left arm and shoulder.  This also caused a significant severe headache and weakness and palpitations.  The patient went to work and felt a little bit better but all of these symptoms returned.  When he was seen in the emergency room he had an EKG showing normal sinus with possible minimal inferior infarct age undetermined.  There is no ST elevation or other changes seen.  The patient was weak and fatigued and had apparently some type of minimal tremor of his head and some paresthesia of his right leg.  This is also unusual but did not appear to be stroke.  The patient then was placed on appropriate medication management for the possibility of non-ST elevation myocardial infarction including nitrates heparin and previous home medications.  He has been more somnolent now without any evidence of additional sedatives.  When pressing on his central and left chest rib area he has severe tenderness and also has tenderness when he  takes a deep breath.  Is now constant chest pain at this time.  He is hemodynamically stable  Past Medical History:  Diagnosis Date  . Asthma   . Coronary artery disease   . Hypothermia   . Hypothyroidism   . Pneumonia 2016  . Sleep apnea    USES CPAP  . Wears contact lenses       Surgical History:  Past Surgical History:  Procedure Laterality Date  . ANAL FISTULOTOMY N/A 06/08/2015   Procedure: ANAL FISTULOTOMY;  Surgeon: Christene Lye, MD;  Location: [facility];  Service: General;  Laterality: N/A;  . ANTERIOR CRUCIATE LIGAMENT REPAIR     knees  . CLOSED REDUCTION NASAL FRACTURE N/A 03/22/2016   Procedure: CLOSED REDUCTION NASAL FRACTURE;  Surgeon: Clyde Canterbury, MD;  Location: [facility];  Service: ENT;  Laterality: N/A;  sleep apnea  . LEFT HEART CATH AND CORONARY ANGIOGRAPHY N/A 09/14/2017   Procedure: LEFT HEART CATH AND CORONARY ANGIOGRAPHY and possible PCI;  Surgeon: Isaias Cowman, MD;  Location: [facility];  Service: Cardiovascular;  Laterality: N/A;  . LIPOMA EXCISION N/A 04/05/2017   Procedure: EXCISION LIPOMA Nuchal area.  prone position;  Surgeon: Jules Husbands, MD;  Location: [facility];  Service: General;  Laterality: N/A;  Prone  . MEDIAL COLLATERAL LIGAMENT REPAIR, KNEE    . SHOULDER ARTHROSCOPY Right      Home Meds: Prior to Admission medications   Medication Sig Start Date End Date Taking? Authorizing Provider  atorvastatin (LIPITOR) 80 MG tablet Take 1 tablet (80 mg total) by mouth daily at 6 PM. 09/15/17  Yes Fritzi Mandes,  MD  clopidogrel (PLAVIX) 75 MG tablet Take 1 tablet (75 mg total) by mouth daily with breakfast. 09/16/17  Yes Fritzi Mandes, MD  levothyroxine (SYNTHROID, LEVOTHROID) 125 MCG tablet Take 125 mcg by mouth daily before breakfast.   Yes [provider]  metoprolol succinate (TOPROL-XL) 25 MG 24 hr tablet Take 1 tablet (25 mg total) by mouth daily. 09/15/17  Yes Fritzi Mandes, MD  Multiple Vitamins-Minerals  (MULTIVITAMIN WITH MINERALS) tablet Take 1 tablet by mouth daily.   Yes [provider]  Vitamin D, Ergocalciferol, (DRISDOL) 50000 units CAPS capsule Take 1 capsule by mouth every 7 (seven) days.  10/06/17  Yes [provider]    Inpatient Medications:  . atorvastatin  80 mg Oral q1800  . clopidogrel  75 mg Oral Q breakfast  . docusate sodium  100 mg Oral BID  . insulin aspart  0-9 Units Subcutaneous TID WC  . levothyroxine  125 mcg Oral QAC breakfast  . metoprolol succinate  25 mg Oral Daily  . pantoprazole (PROTONIX) IV  40 mg Intravenous Q12H   . 0.9 % NaCl with KCl 20 mEq / L    . heparin 1,150 Units/hr (11/25/17 1314)  . nitroGLYCERIN Stopped (11/25/17 1226)    Allergies:  Allergies  Allergen Reactions  . Aspirin Itching and Swelling  . Brussels Sprouts [Brassica Oleracea Italica] Swelling  . Other Swelling    OLIVES-SWELLING  . Morphine And Related     Had tingling fingers,chest pain,tightness after dose.    Social History   Socioeconomic History  . Marital status: Single    Spouse name: Not on file  . Number of children: Not on file  . Years of education: Not on file  . Highest education level: Not on file  Social Needs  . Financial resource strain: Not on file  . Food insecurity - worry: Not on file  . Food insecurity - inability: Not on file  . Transportation needs - medical: Not on file  . Transportation needs - non-medical: Not on file  Occupational History    Employer: [employer]  Tobacco Use  . Smoking status: Former Smoker    Packs/day: 2.00    Years: 6.00    Pack years: 12.00    Types: Cigarettes    Last attempt to quit: 09/12/1990    Years since quitting: 27.2  . Smokeless tobacco: Never Used  Substance and Sexual Activity  . Alcohol use: Yes    Alcohol/week: 0.0 oz    Comment: occassionally  . Drug use: No  . Sexual activity: Not on file  Other Topics Concern  . Not on file  Social History Narrative  . Not on file      Family History  Problem Relation Age of Onset  . Thyroid disease Father   . Heart attack Unknown      Review of Systems Positive for chest pain pleuritic pain weakness tremor Negative for: General:  chills, fever, night sweats or weight changes.  Cardiovascular: PND orthopnea syncope dizziness  Dermatological skin lesions rashes Respiratory: Cough congestion Urologic: Frequent urination urination at night and hematuria Abdominal: negative for nausea, vomiting, diarrhea, bright red blood per rectum, melena, or hematemesis Neurologic: negative for visual changes, and/or hearing changes  All other systems reviewed and are otherwise negative except as noted above.  Labs: Recent Labs    11/25/17 1021  TROPONINI <0.03   Lab Results  Component Value Date   WBC 8.3 11/25/2017   HGB 15.8 11/25/2017  HCT 45.9 11/25/2017   MCV 85.8 11/25/2017   PLT 185 11/25/2017    Recent Labs  Lab 11/25/17 1021  NA 139  K 3.7  CL 109  CO2 21*  BUN 21*  CREATININE 0.81  CALCIUM 9.1  GLUCOSE 145*   Lab Results  Component Value Date   CHOL 188 09/14/2017   HDL 36 (L) 09/14/2017   LDLCALC 107 (H) 09/14/2017   TRIG 225 (H) 09/14/2017   No results found for: DDIMER  Radiology/Studies:  Dg Chest 2 View  Result Date: 11/25/2017 CLINICAL DATA:  Shortness of breath, weakness and palpitations. EXAM: CHEST - 2 VIEW COMPARISON:  Chest radiograph 09/13/2017. FINDINGS: Monitoring leads overlie the patient. Low lung volumes. Stable cardiac and mediastinal contours. No consolidative pulmonary opacities. No pleural effusion or pneumothorax. Regional skeleton is unremarkable. IMPRESSION: Low lung volumes with basilar atelectasis. Electronically Signed   By: Lovey Newcomer M.D.   On: 11/25/2017 10:57   Ct Head Wo Contrast  Result Date: 11/25/2017 CLINICAL DATA:  52 year old with bilateral lower extremity weakness and palpitations that began this morning. EXAM: CT HEAD WITHOUT CONTRAST TECHNIQUE:  Contiguous axial images were obtained from the base of the skull through the vertex without intravenous contrast. COMPARISON:  03/09/2016. FINDINGS: Brain: Ventricular system normal in size and appearance for age. No mass lesion. No midline shift. No acute hemorrhage or hematoma. No extra-axial fluid collections. No evidence of acute infarction. No focal brain parenchymal abnormalities. Vascular: No hyperdense vessel.  No visible atherosclerosis. Skull: No skull fracture or other focal osseous abnormality involving the skull. Sinuses/Orbits: Visualized paranasal sinuses, bilateral mastoid air cells and bilateral middle ear cavities well-aerated. Visualized orbits and globes normal in appearance. Other: Remote fracture of the medial wall of the left orbit. IMPRESSION: Normal intracranially. Electronically Signed   By: Evangeline Dakin M.D.   On: 11/25/2017 12:16   Ct Angio Chest/abd/pel For Dissection W And/or Wo Contrast  Result Date: 11/25/2017 CLINICAL DATA:  Short of breath EXAM: CT ANGIOGRAPHY CHEST, ABDOMEN AND PELVIS TECHNIQUE: Multidetector CT imaging through the chest, abdomen and pelvis was performed using the standard protocol during bolus administration of intravenous contrast. Multiplanar reconstructed images and MIPs were obtained and reviewed to evaluate the vascular anatomy. CONTRAST:  128mL ISOVUE-370 IOPAMIDOL (ISOVUE-370) INJECTION 76% COMPARISON:  09/13/2017.  03/16/2005. FINDINGS: CTA CHEST FINDINGS Cardiovascular: No evidence of intramural hematoma. No evidence of aortic dissection or aneurysm. Great vessels patent. There is no obvious acute pulmonary thromboembolism. Right coronary artery calcifications are moderately prominent. Mediastinum/Nodes: No abnormal mediastinal adenopathy. No pericardial effusion. Normal thyroid gland. Lungs/Pleura: Low volumes and subsegmental atelectasis in the dependent lungs. Musculoskeletal: No vertebral compression deformity. No acute rib fracture. Review of  the MIP images confirms the above findings. CTA ABDOMEN AND PELVIS FINDINGS VASCULAR Aorta: Aorta is nonaneurysmal and patent. There is no evidence of aortic dissection. Celiac: Patent.  Accessory left hepatic artery anatomy. SMA: Patent. Renals: Single right renal artery is patent. Two left renal arteries are patent. IMA: Patent. Inflow: Common, internal, and external iliac arteries are patent. Review of the MIP images confirms the above findings. NON-VASCULAR Hepatobiliary: Diffuse hepatic steatosis. Gallbladder is within normal limits. Pancreas: Unremarkable Spleen: Unremarkable Adrenals/Urinary Tract: Left adrenal lipoma. Right adrenal gland and kidneys are within normal limits. Bladder is decompressed. Stomach/Bowel: Stomach is distended with enteric contents. Duodenum is within normal limits. There is no evidence of small-bowel obstruction. Normal appendix. No obvious mass in the colon. There is air and fluid throughout the colon. Lymphatic: No  abnormal retroperitoneal adenopathy. Reproductive: Prostate is within normal limits. Other: No free fluid. Musculoskeletal: No vertebral compression. Review of the MIP images confirms the above findings. IMPRESSION: No evidence of aortic dissection or acute vascular process. The stomach is distended of unknown significance. There is no evidence of small-bowel obstruction. Electronically Signed   By: Marybelle Killings M.D.   On: 11/25/2017 12:32    EKG: Normal sinus rhythm with inferior infarct age undetermined  Weights: Filed Weights   11/25/17 1013 11/25/17 1440  Weight: 210 lb (95.3 kg) 207 lb 3.7 oz (94 kg)     Physical Exam: Blood pressure 110/70, pulse 96, temperature 98.1 F (36.7 C), temperature source Oral, resp. rate (!) 21, height 5\' 8"  (1.727 m), weight 207 lb 3.7 oz (94 kg), SpO2 96 %. Body mass index is 31.51 kg/m. General: Well developed, well nourished, in no acute distress. Head eyes ears nose throat: Normocephalic, atraumatic, sclera  non-icteric, no xanthomas, nares are without discharge. No apparent thyromegaly and/or mass  Lungs: Normal respiratory effort.  no wheezes, no rales, no rhonchi.  Heart: RRR with normal S1 S2. no murmur gallop, no rub, PMI is normal size and placement, carotid upstroke normal without bruit, jugular venous pressure is normal Abdomen: Soft, non-tender, non-distended with normoactive bowel sounds. No hepatomegaly. No rebound/guarding. No obvious abdominal masses. Abdominal aorta is normal size without bruit Extremities: No edema. no cyanosis, no clubbing, no ulcers  Peripheral : 2+ bilateral upper extremity pulses, 2+ bilateral femoral pulses, 2+ bilateral dorsal pedal pulse Neuro: Alert and oriented. No facial asymmetry. No focal deficit. Moves all extremities spontaneously. Musculoskeletal: Normal muscle tone without kyphosis Psych:  Responds to questions appropriately with a normal affect.    Assessment: 52 year old male with known coronary artery disease status post non-ST elevation myocardial infarction PCI and stent placement of right coronary artery in January on appropriate medication management with very atypical left-sided chest discomfort without evidence of myocardial infarction or EKG changes at this time  Plan: 1.  Maximize medication management for anginal symptoms including nitrates Plavix and heparin 2.  Continue serial ECG and enzymes to assess for the possibility of myocardial infarction 3.  Echocardiogram for LV systolic dysfunction valvular heart disease contributing to above 4.  Continue beta-blocker ACE inhibitor and high intensity cholesterol therapy 5.  Other diagnostic testing and treatment options after above  Signed, Corey Skains M.D. [facility] 11/25/2017, 3:59 PM

## 2017-11-25 NOTE — ED Notes (Signed)
Patient given warm blanket, pants and socks removed and ED socks put on. Patient answers questions appropriately in a soft voice. Patient c/o head tremors. Dr. Clearnce Hasten aware. Patient appears more calm. RR 14-20. Skin is dry, color WNL, good turgor, cap refill under 3 seconds.

## 2017-11-25 NOTE — ED Triage Notes (Signed)
Patient presents to the ED with shortness of breath, weakness and palpitations that began this morning.  Patient reports feeling like, "my legs might give out on me."  Patient appears pale and anxious.  Patient reports history of heart attack and states he feels similarly to that time except that he is not having chest pain.

## 2017-11-25 NOTE — Progress Notes (Signed)
ANTICOAGULATION CONSULT NOTE - Initial Consult  Pharmacy Consult for heparin  Indication: ACS/STEMI   Allergies  Allergen Reactions  . Aspirin Itching and Swelling  . Brussels Sprouts [Brassica Oleracea Italica] Swelling  . Other Swelling    OLIVES-SWELLING  . Morphine And Related     Had tingling fingers,chest pain,tightness after dose.    Patient Measurements: Height: 5\' 7"  (170.2 cm) Weight: 210 lb (95.3 kg) IBW/kg (Calculated) : 66.1 Heparin Dosing Weight: 86.4 kg  Vital Signs: Temp: 98.3 F (36.8 C) (03/16 1011) Temp Source: Oral (03/16 1011) BP: 126/72 (03/16 1211) Pulse Rate: 92 (03/16 1211)  Labs: Recent Labs    11/25/17 1021  HGB 16.6  HCT 48.7  PLT 188  CREATININE 0.81  TROPONINI <0.03    Estimated Creatinine Clearance: 121.4 mL/min (by C-G formula based on SCr of 0.81 mg/dL).   Medical History: Past Medical History:  Diagnosis Date  . Asthma   . Coronary artery disease   . Hypothermia   . Hypothyroidism   . Pneumonia 2016  . Sleep apnea    USES CPAP  . Wears contact lenses     Assessment: 52 year old male who presents to ED with SOB and left-sided chest pain. PMH STEMI this past Jan. Is on Plavix at home but no anticoagulants.   Goal of Therapy:  Heparin level 0.3-0.7 units/ml Monitor platelets by anticoagulation protocol: Yes   Plan:  Will give heparin bolus 4000 units followed by heparin drip 1150 units/hr.  Will check HL in 6 hrs. Will continue to monitor labs, heparin level and adjust if necessary per protocol.   Candelaria Stagers, PharmD Pharmacy Resident  11/25/2017,12:40 PM

## 2017-11-25 NOTE — H&P (Signed)
History and Physical    Erik Bush VEL:[id number] DOB: 1966-01-01 DOA: 11/25/2017  Referring physician: Dr. Clearnce Hasten PCP: Casilda Carls, MD  Specialists: Dr. Clayborn Bigness  Chief Complaint: CP, SOB, and palpitations  HPI: Erik Bush is a 52 y.o. male has a past medical history significant for CAD s/p MI who awoke this AM with CP, SOB, and palpitations. Also c/o HA and "head bobbing". Brought to ER where is somewhat lethargic. Intitial troponin normal but EKG shows T-wave inversion with ? ST elevation. Head CT and chest CT non-diagnostic. He continues to c/o intermittent CP with SOB, HA, and palpitations. He is now admitted. No fever. No N/V/D  Review of Systems: The patient denies anorexia, fever, weight loss,, vision loss, decreased hearing, hoarseness,, syncope,  peripheral edema, balance deficits, hemoptysis, abdominal pain, melena, hematochezia, severe indigestion/heartburn, hematuria, incontinence, genital sores, muscle weakness, suspicious skin lesions, transient blindness, difficulty walking, depression, unusual weight change, abnormal bleeding, enlarged lymph nodes, angioedema, and breast masses.   Past Medical History:  Diagnosis Date  . Asthma   . Coronary artery disease   . Hypothermia   . Hypothyroidism   . Pneumonia 2016  . Sleep apnea    USES CPAP  . Wears contact lenses    Past Surgical History:  Procedure Laterality Date  . ANAL FISTULOTOMY N/A 06/08/2015   Procedure: ANAL FISTULOTOMY;  Surgeon: Christene Lye, MD;  Location: [facility];  Service: General;  Laterality: N/A;  . ANTERIOR CRUCIATE LIGAMENT REPAIR     knees  . CLOSED REDUCTION NASAL FRACTURE N/A 03/22/2016   Procedure: CLOSED REDUCTION NASAL FRACTURE;  Surgeon: Clyde Canterbury, MD;  Location: [facility];  Service: ENT;  Laterality: N/A;  sleep apnea  . LEFT HEART CATH AND CORONARY ANGIOGRAPHY N/A 09/14/2017   Procedure: LEFT HEART CATH AND CORONARY ANGIOGRAPHY and possible PCI;  Surgeon:  Isaias Cowman, MD;  Location: [facility];  Service: Cardiovascular;  Laterality: N/A;  . LIPOMA EXCISION N/A 04/05/2017   Procedure: EXCISION LIPOMA Nuchal area.  prone position;  Surgeon: Jules Husbands, MD;  Location: [facility];  Service: General;  Laterality: N/A;  Prone  . MEDIAL COLLATERAL LIGAMENT REPAIR, KNEE    . SHOULDER ARTHROSCOPY Right    Social History:  reports that he quit smoking about 27 years ago. His smoking use included cigarettes. He has a 12.00 pack-year smoking history. he has never used smokeless tobacco. He reports that he drinks alcohol. He reports that he does not use drugs.  Allergies  Allergen Reactions  . Aspirin Itching and Swelling  . Brussels Sprouts [Brassica Oleracea Italica] Swelling  . Other Swelling    OLIVES-SWELLING  . Morphine And Related     Had tingling fingers,chest pain,tightness after dose.    Family History  Problem Relation Age of Onset  . Thyroid disease Father   . Heart attack Unknown     Prior to Admission medications   Medication Sig Start Date End Date Taking? Authorizing Provider  atorvastatin (LIPITOR) 80 MG tablet Take 1 tablet (80 mg total) by mouth daily at 6 PM. 09/15/17  Yes Fritzi Mandes, MD  clopidogrel (PLAVIX) 75 MG tablet Take 1 tablet (75 mg total) by mouth daily with breakfast. 09/16/17  Yes Fritzi Mandes, MD  levothyroxine (SYNTHROID, LEVOTHROID) 125 MCG tablet Take 125 mcg by mouth daily before breakfast.   Yes [provider]  metoprolol succinate (TOPROL-XL) 25 MG 24 hr tablet Take 1 tablet (25 mg total) by mouth daily. 09/15/17  Yes  Fritzi Mandes, MD  Multiple Vitamins-Minerals (MULTIVITAMIN WITH MINERALS) tablet Take 1 tablet by mouth daily.   Yes [provider]  Vitamin D, Ergocalciferol, (DRISDOL) 50000 units CAPS capsule Take 1 capsule by mouth every 7 (seven) days.  10/06/17  Yes [provider]   Physical Exam: Vitals:   11/25/17 1200 11/25/17 1211 11/25/17 1253 11/25/17 1300   BP: 126/72 126/72 98/80 111/71  Pulse:  92 94 94  Resp: (!) 21 14 16  (!) 25  Temp:      TempSrc:      SpO2:  97% 98% 94%  Weight:      Height:         General:  No apparent distress, WDWN, [place]/AT  Eyes: PERRL, EOMI, no scleral icterus, conjunctiva clear  ENT: moist oropharynx without exudate, TM's benign, dentition fair  Neck: supple, no lymphadenopathy. No bruits or thyromegaly  Cardiovascular: regular rate without MRG; 2+ peripheral pulses, no JVD, no peripheral edema  Respiratory: CTA biL, good air movement without wheezing, rhonchi or crackled. Respiratory effort normal  Abdomen: soft, non tender to palpation, positive bowel sounds, no guarding, no rebound  Skin: no rashes or lesions  Musculoskeletal: normal bulk and tone, no joint swelling  Psychiatric: normal mood and affect, A&OX3  Neurologic: CN 2-12 grossly intact, Motor strength 5/5 in all 4 groups with symmetric DTR's and non-focal sensory exam  Labs on Admission:  Basic Metabolic Panel: Recent Labs  Lab 11/25/17 1021  NA 139  K 3.7  CL 109  CO2 21*  GLUCOSE 145*  BUN 21*  CREATININE 0.81  CALCIUM 9.1   Liver Function Tests: No results for input(s): AST, ALT, ALKPHOS, BILITOT, PROT, ALBUMIN in the last 168 hours. No results for input(s): LIPASE, AMYLASE in the last 168 hours. No results for input(s): AMMONIA in the last 168 hours. CBC: Recent Labs  Lab 11/25/17 1021  WBC 8.7  HGB 16.6  HCT 48.7  MCV 84.8  PLT 188   Cardiac Enzymes: Recent Labs  Lab 11/25/17 1021  TROPONINI <0.03    BNP (last 3 results) No results for input(s): BNP in the last 8760 hours.  ProBNP (last 3 results) No results for input(s): PROBNP in the last 8760 hours.  CBG: No results for input(s): GLUCAP in the last 168 hours.  Radiological Exams on Admission: Dg Chest 2 View  Result Date: 11/25/2017 CLINICAL DATA:  Shortness of breath, weakness and palpitations. EXAM: CHEST - 2 VIEW COMPARISON:  Chest  radiograph 09/13/2017. FINDINGS: Monitoring leads overlie the patient. Low lung volumes. Stable cardiac and mediastinal contours. No consolidative pulmonary opacities. No pleural effusion or pneumothorax. Regional skeleton is unremarkable. IMPRESSION: Low lung volumes with basilar atelectasis. Electronically Signed   By: Lovey Newcomer M.D.   On: 11/25/2017 10:57   Ct Head Wo Contrast  Result Date: 11/25/2017 CLINICAL DATA:  52 year old with bilateral lower extremity weakness and palpitations that began this morning. EXAM: CT HEAD WITHOUT CONTRAST TECHNIQUE: Contiguous axial images were obtained from the base of the skull through the vertex without intravenous contrast. COMPARISON:  03/09/2016. FINDINGS: Brain: Ventricular system normal in size and appearance for age. No mass lesion. No midline shift. No acute hemorrhage or hematoma. No extra-axial fluid collections. No evidence of acute infarction. No focal brain parenchymal abnormalities. Vascular: No hyperdense vessel.  No visible atherosclerosis. Skull: No skull fracture or other focal osseous abnormality involving the skull. Sinuses/Orbits: Visualized paranasal sinuses, bilateral mastoid air cells and bilateral middle ear cavities well-aerated. Visualized orbits and  globes normal in appearance. Other: Remote fracture of the medial wall of the left orbit. IMPRESSION: Normal intracranially. Electronically Signed   By: Evangeline Dakin M.D.   On: 11/25/2017 12:16   Ct Angio Chest/abd/pel For Dissection W And/or Wo Contrast  Result Date: 11/25/2017 CLINICAL DATA:  Short of breath EXAM: CT ANGIOGRAPHY CHEST, ABDOMEN AND PELVIS TECHNIQUE: Multidetector CT imaging through the chest, abdomen and pelvis was performed using the standard protocol during bolus administration of intravenous contrast. Multiplanar reconstructed images and MIPs were obtained and reviewed to evaluate the vascular anatomy. CONTRAST:  156mL ISOVUE-370 IOPAMIDOL (ISOVUE-370) INJECTION 76%  COMPARISON:  09/13/2017.  03/16/2005. FINDINGS: CTA CHEST FINDINGS Cardiovascular: No evidence of intramural hematoma. No evidence of aortic dissection or aneurysm. Great vessels patent. There is no obvious acute pulmonary thromboembolism. Right coronary artery calcifications are moderately prominent. Mediastinum/Nodes: No abnormal mediastinal adenopathy. No pericardial effusion. Normal thyroid gland. Lungs/Pleura: Low volumes and subsegmental atelectasis in the dependent lungs. Musculoskeletal: No vertebral compression deformity. No acute rib fracture. Review of the MIP images confirms the above findings. CTA ABDOMEN AND PELVIS FINDINGS VASCULAR Aorta: Aorta is nonaneurysmal and patent. There is no evidence of aortic dissection. Celiac: Patent.  Accessory left hepatic artery anatomy. SMA: Patent. Renals: Single right renal artery is patent. Two left renal arteries are patent. IMA: Patent. Inflow: Common, internal, and external iliac arteries are patent. Review of the MIP images confirms the above findings. NON-VASCULAR Hepatobiliary: Diffuse hepatic steatosis. Gallbladder is within normal limits. Pancreas: Unremarkable Spleen: Unremarkable Adrenals/Urinary Tract: Left adrenal lipoma. Right adrenal gland and kidneys are within normal limits. Bladder is decompressed. Stomach/Bowel: Stomach is distended with enteric contents. Duodenum is within normal limits. There is no evidence of small-bowel obstruction. Normal appendix. No obvious mass in the colon. There is air and fluid throughout the colon. Lymphatic: No abnormal retroperitoneal adenopathy. Reproductive: Prostate is within normal limits. Other: No free fluid. Musculoskeletal: No vertebral compression. Review of the MIP images confirms the above findings. IMPRESSION: No evidence of aortic dissection or acute vascular process. The stomach is distended of unknown significance. There is no evidence of small-bowel obstruction. Electronically Signed   By: Marybelle Killings M.D.   On: 11/25/2017 12:32    EKG: Independently reviewed.  Assessment/Plan Principal Problem:   Chest pain Active Problems:   CAD (coronary artery disease)   Abnormal EKG   Headache   Will admit to Stepdown with NTG drip and IV Heparin. Follow enzymes and order echo. Urgent Cardiology consult. Consult Neuro for severe HA and "bobbing". Repeat labs in AM.  Diet: clear liquids Fluids: NS@75  DVT Prophylaxis: IV Heparin  Code Status: FULL  Family Communication: yes  Disposition Plan: home  Time spent: 50 min

## 2017-11-25 NOTE — ED Notes (Signed)
Heparin infusion and bolus verified by Melvenia Beam and Mateo Flow RN

## 2017-11-25 NOTE — ED Notes (Signed)
Pt out of room for x-ray 

## 2017-11-25 NOTE — ED Notes (Signed)
Nitro drip held per Dr. Clearnce Hasten prior to going to CT scan.

## 2017-11-25 NOTE — ED Notes (Signed)
Patient placed on 2L O2 via [place] for c/o "can't breathe."

## 2017-11-25 NOTE — ED Notes (Signed)
RN in room to check on pt. Pt reports that he is now having left sided chest pain. Repeat EKG obtained and given to Dr. Reita Cliche. RN reassured pt that per MD EKG looked ok and that we are waiting on his blood work to come back. Pt taking shallow, rapid breaths at this time. No diaphoresis noted. Will continue to monitor patient.

## 2017-11-25 NOTE — ED Notes (Signed)
Nitro drip held per Dr. Clearnce Hasten due to hypotension.

## 2017-11-25 NOTE — ED Notes (Signed)
Patient given mouth swabs for c/o dry mouth.

## 2017-11-25 NOTE — ED Notes (Addendum)
Patient is back from CT scan. Patient was able to transfer self to CT table and back again independently.

## 2017-11-26 ENCOUNTER — Observation Stay (HOSPITAL_BASED_OUTPATIENT_CLINIC_OR_DEPARTMENT_OTHER)
Admit: 2017-11-26 | Discharge: 2017-11-26 | Disposition: A | Discharge status: Home / Self Care | Payer: BLUE CROSS/BLUE SHIELD | Attending: Internal Medicine | Admitting: Internal Medicine

## 2017-11-26 DIAGNOSIS — R079 Chest pain, unspecified: Secondary | ICD-10-CM

## 2017-11-26 DIAGNOSIS — R51 Headache: Secondary | ICD-10-CM

## 2017-11-26 DIAGNOSIS — I361 Nonrheumatic tricuspid (valve) insufficiency: Secondary | ICD-10-CM

## 2017-11-26 DIAGNOSIS — R251 Tremor, unspecified: Secondary | ICD-10-CM

## 2017-11-26 DIAGNOSIS — I25118 Atherosclerotic heart disease of native coronary artery with other forms of angina pectoris: Secondary | ICD-10-CM

## 2017-11-26 DIAGNOSIS — A0811 Acute gastroenteropathy due to Norwalk agent: Secondary | ICD-10-CM

## 2017-11-26 DIAGNOSIS — R9431 Abnormal electrocardiogram [ECG] [EKG]: Secondary | ICD-10-CM

## 2017-11-26 LAB — GASTROINTESTINAL PANEL BY PCR, STOOL (REPLACES STOOL CULTURE)
ADENOVIRUS F40/41: NOT DETECTED
ASTROVIRUS: NOT DETECTED
CAMPYLOBACTER SPECIES: NOT DETECTED
Cryptosporidium: NOT DETECTED
Cyclospora cayetanensis: NOT DETECTED
ENTEROAGGREGATIVE E COLI (EAEC): NOT DETECTED
ENTEROPATHOGENIC E COLI (EPEC): NOT DETECTED
ENTEROTOXIGENIC E COLI (ETEC): NOT DETECTED
Entamoeba histolytica: NOT DETECTED
Giardia lamblia: NOT DETECTED
NOROVIRUS GI/GII: DETECTED — AB
PLESIMONAS SHIGELLOIDES: NOT DETECTED
Rotavirus A: NOT DETECTED
Salmonella species: NOT DETECTED
Sapovirus (I, II, IV, and V): NOT DETECTED
Shiga like toxin producing E coli (STEC): NOT DETECTED
Shigella/Enteroinvasive E coli (EIEC): NOT DETECTED
Vibrio cholerae: NOT DETECTED
Vibrio species: NOT DETECTED
Yersinia enterocolitica: NOT DETECTED

## 2017-11-26 LAB — COMPREHENSIVE METABOLIC PANEL
ALT: 42 U/L (ref 17–63)
ANION GAP: 7 (ref 5–15)
AST: 25 U/L (ref 15–41)
Albumin: 4 g/dL (ref 3.5–5.0)
Alkaline Phosphatase: 77 U/L (ref 38–126)
BILIRUBIN TOTAL: 1.6 mg/dL — AB (ref 0.3–1.2)
BUN: 19 mg/dL (ref 6–20)
CO2: 21 mmol/L — ABNORMAL LOW (ref 22–32)
Calcium: 8.4 mg/dL — ABNORMAL LOW (ref 8.9–10.3)
Chloride: 109 mmol/L (ref 101–111)
Creatinine, Ser: 0.79 mg/dL (ref 0.61–1.24)
Glucose, Bld: 116 mg/dL — ABNORMAL HIGH (ref 65–99)
Potassium: 3.5 mmol/L (ref 3.5–5.1)
Sodium: 137 mmol/L (ref 135–145)
TOTAL PROTEIN: 7.2 g/dL (ref 6.5–8.1)

## 2017-11-26 LAB — C DIFFICILE QUICK SCREEN W PCR REFLEX
C DIFFICILE (CDIFF) INTERP: NOT DETECTED
C Diff antigen: NEGATIVE
C Diff toxin: NEGATIVE

## 2017-11-26 LAB — CBC
HEMATOCRIT: 44.9 % (ref 40.0–52.0)
HEMOGLOBIN: 15.3 g/dL (ref 13.0–18.0)
MCH: 28.9 pg (ref 26.0–34.0)
MCHC: 34.2 g/dL (ref 32.0–36.0)
MCV: 84.4 fL (ref 80.0–100.0)
Platelets: 166 10*3/uL (ref 150–440)
RBC: 5.31 MIL/uL (ref 4.40–5.90)
RDW: 14.7 % — ABNORMAL HIGH (ref 11.5–14.5)
WBC: 6.4 10*3/uL (ref 3.8–10.6)

## 2017-11-26 LAB — ECHOCARDIOGRAM COMPLETE
Height: 68 in
Weight: 3319.25 oz

## 2017-11-26 LAB — GLUCOSE, CAPILLARY: Glucose-Capillary: 96 mg/dL (ref 65–99)

## 2017-11-26 LAB — TROPONIN I: Troponin I: <0.03 ng/mL (ref <0.03)

## 2017-11-26 LAB — HEPARIN LEVEL (UNFRACTIONATED): Heparin Unfractionated: 0.4 IU/mL (ref 0.30–0.70)

## 2017-11-26 NOTE — Progress Notes (Signed)
[facility] Progess Note    SYNOPSIS   Erik Bush is a 52 year old gentleman with a past medical history remarkable for hypothyroidism, hypertension, obstructive sleep apnea presently on CPAP, asthma, coronary artery disease, status post PCI with stent placement, has been on Plavix alone secondary to aspirin allergy, presented with left-sided chest pain, palpitations and shortness of breath.   ASSESSMENT/PLAN   Diarrhea.  Patient is positive for normal virus.  Symptomatic support and hydration  Chest pain.  EKG noted to have chronic inferior wall changes with negative troponins, pending cardiology  Head-bobbing.  I am unclear what this is from.  I did not notice it today when he was asleep.  Neurology input pending   INTAKE / OUTPUT:  Intake/Output Summary (Last 24 hours) at 11/26/2017 0749 Last data filed at 11/26/2017 0614 Gross per 24 hour  Intake 1283.41 ml  Output 525 ml  Net 758.41 ml    Name: Erik Bush MRN: [id number] DOB: December 23, 1965    ADMISSION DATE:  11/25/2017  SUBJECTIVE:   Patient is sleeping right now, complaints of chest pain throughout the night.  Also diarrhea came back positive for norovirus.  VITAL SIGNS: Temp:  [98.1 F (36.7 C)-100.2 F (37.9 C)] 98.2 F (36.8 C) (03/17 0400) Pulse Rate:  [85-110] 95 (03/17 0600) Resp:  [14-28] 19 (03/17 0600) BP: (91-126)/(53-90) 102/61 (03/17 0600) SpO2:  [92 %-99 %] 95 % (03/17 0600) Weight:  [94 kg (207 lb 3.7 oz)-95.3 kg (210 lb)] 94.1 kg (207 lb 7.3 oz) (03/17 0344)  PHYSICAL EXAMINATION: Physical Examination:   VS: BP 102/61   Pulse 95   Temp 98.2 F (36.8 C) (Oral)   Resp 19   Ht 5\' 8"  (1.727 m)   Wt 94.1 kg (207 lb 7.3 oz)   SpO2 95%   BMI 31.54 kg/m   General Appearance: No distress  Neuro:without focal findings, mental status normal. HEENT: PERRLA, EOM intact. Pulmonary: normal breath sounds   CardiovascularNormal S1,S2.  No m/r/g.   Abdomen: Benign, Soft,  non-tender. Skin:   warm, no rashes, no ecchymosis  Extremities: normal, no cyanosis, clubbing.    LABORATORY PANEL:   CBC Recent Labs  Lab 11/26/17 0255  WBC 6.4  HGB 15.3  HCT 44.9  PLT 166    Chemistries  Recent Labs  Lab 11/26/17 0255  NA 137  K 3.5  CL 109  CO2 21*  GLUCOSE 116*  BUN 19  CREATININE 0.79  CALCIUM 8.4*  AST 25  ALT 42  ALKPHOS 77  BILITOT 1.6*    Recent Labs  Lab 11/25/17 1440  GLUCAP 116*   No results for input(s): PHART, PCO2ART, PO2ART in the last 168 hours. Recent Labs  Lab 11/26/17 0255  AST 25  ALT 42  ALKPHOS 77  BILITOT 1.6*  ALBUMIN 4.0    Cardiac Enzymes Recent Labs  Lab 11/26/17 0255  TROPONINI <0.03    RADIOLOGY:  Dg Chest 2 View  Result Date: 11/25/2017 CLINICAL DATA:  Shortness of breath, weakness and palpitations. EXAM: CHEST - 2 VIEW COMPARISON:  Chest radiograph 09/13/2017. FINDINGS: Monitoring leads overlie the patient. Low lung volumes. Stable cardiac and mediastinal contours. No consolidative pulmonary opacities. No pleural effusion or pneumothorax. Regional skeleton is unremarkable. IMPRESSION: Low lung volumes with basilar atelectasis. Electronically Signed   By: Lovey Newcomer M.D.   On: 11/25/2017 10:57   Ct Head Wo Contrast  Result Date: 11/25/2017 CLINICAL DATA:  52 year old with bilateral lower extremity weakness and palpitations that  began this morning. EXAM: CT HEAD WITHOUT CONTRAST TECHNIQUE: Contiguous axial images were obtained from the base of the skull through the vertex without intravenous contrast. COMPARISON:  03/09/2016. FINDINGS: Brain: Ventricular system normal in size and appearance for age. No mass lesion. No midline shift. No acute hemorrhage or hematoma. No extra-axial fluid collections. No evidence of acute infarction. No focal brain parenchymal abnormalities. Vascular: No hyperdense vessel.  No visible atherosclerosis. Skull: No skull fracture or other focal osseous abnormality involving  the skull. Sinuses/Orbits: Visualized paranasal sinuses, bilateral mastoid air cells and bilateral middle ear cavities well-aerated. Visualized orbits and globes normal in appearance. Other: Remote fracture of the medial wall of the left orbit. IMPRESSION: Normal intracranially. Electronically Signed   By: Evangeline Dakin M.D.   On: 11/25/2017 12:16   Ct Angio Chest/abd/pel For Dissection W And/or Wo Contrast  Result Date: 11/25/2017 CLINICAL DATA:  Short of breath EXAM: CT ANGIOGRAPHY CHEST, ABDOMEN AND PELVIS TECHNIQUE: Multidetector CT imaging through the chest, abdomen and pelvis was performed using the standard protocol during bolus administration of intravenous contrast. Multiplanar reconstructed images and MIPs were obtained and reviewed to evaluate the vascular anatomy. CONTRAST:  173mL ISOVUE-370 IOPAMIDOL (ISOVUE-370) INJECTION 76% COMPARISON:  09/13/2017.  03/16/2005. FINDINGS: CTA CHEST FINDINGS Cardiovascular: No evidence of intramural hematoma. No evidence of aortic dissection or aneurysm. Great vessels patent. There is no obvious acute pulmonary thromboembolism. Right coronary artery calcifications are moderately prominent. Mediastinum/Nodes: No abnormal mediastinal adenopathy. No pericardial effusion. Normal thyroid gland. Lungs/Pleura: Low volumes and subsegmental atelectasis in the dependent lungs. Musculoskeletal: No vertebral compression deformity. No acute rib fracture. Review of the MIP images confirms the above findings. CTA ABDOMEN AND PELVIS FINDINGS VASCULAR Aorta: Aorta is nonaneurysmal and patent. There is no evidence of aortic dissection. Celiac: Patent.  Accessory left hepatic artery anatomy. SMA: Patent. Renals: Single right renal artery is patent. Two left renal arteries are patent. IMA: Patent. Inflow: Common, internal, and external iliac arteries are patent. Review of the MIP images confirms the above findings. NON-VASCULAR Hepatobiliary: Diffuse hepatic steatosis. Gallbladder  is within normal limits. Pancreas: Unremarkable Spleen: Unremarkable Adrenals/Urinary Tract: Left adrenal lipoma. Right adrenal gland and kidneys are within normal limits. Bladder is decompressed. Stomach/Bowel: Stomach is distended with enteric contents. Duodenum is within normal limits. There is no evidence of small-bowel obstruction. Normal appendix. No obvious mass in the colon. There is air and fluid throughout the colon. Lymphatic: No abnormal retroperitoneal adenopathy. Reproductive: Prostate is within normal limits. Other: No free fluid. Musculoskeletal: No vertebral compression. Review of the MIP images confirms the above findings. IMPRESSION: No evidence of aortic dissection or acute vascular process. The stomach is distended of unknown significance. There is no evidence of small-bowel obstruction. Electronically Signed   By: Marybelle Killings M.D.   On: 11/25/2017 12:32     Hermelinda Dellen, DO  11/26/2017

## 2017-11-26 NOTE — Progress Notes (Signed)
ANTICOAGULATION CONSULT NOTE - Initial Consult  Pharmacy Consult for heparin  Indication: ACS/STEMI   Allergies  Allergen Reactions  . Aspirin Itching and Swelling  . Brussels Sprouts [Brassica Oleracea Italica] Swelling  . Other Swelling    OLIVES-SWELLING  . Morphine And Related     Had tingling fingers,chest pain,tightness after dose.    Patient Measurements: Height: 5\' 8"  (172.7 cm) Weight: 207 lb 3.7 oz (94 kg) IBW/kg (Calculated) : 68.4 Heparin Dosing Weight: 86.4 kg  Vital Signs: Temp: 98.3 F (36.8 C) (03/17 0000) Temp Source: Oral (03/17 0000) BP: 98/62 (03/17 0200) Pulse Rate: 92 (03/17 0200)  Labs: Recent Labs    11/25/17 1021 11/25/17 1406 11/25/17 1500 11/25/17 1946 11/26/17 0255  HGB 16.6 15.8  --   --  15.3  HCT 48.7 45.9  --   --  44.9  PLT 188 185  --   --  166  APTT  --  138*  --   --   --   LABPROT  --  15.2  --   --   --   INR  --  1.21  --   --   --   HEPARINUNFRC  --   --   --  0.37 0.40  CREATININE 0.81  --   --   --  0.79  TROPONINI <0.03  --  <0.03 <0.03 <0.03    Estimated Creatinine Clearance: 124.2 mL/min (by C-G formula based on SCr of 0.79 mg/dL).   Medical History: Past Medical History:  Diagnosis Date  . Asthma   . Coronary artery disease   . Hypothermia   . Hypothyroidism   . Pneumonia 2016  . Sleep apnea    USES CPAP  . Wears contact lenses     Assessment: 52 year old male who presents to ED with SOB and left-sided chest pain. PMH STEMI this past Jan. Is on Plavix at home but no anticoagulants.   Goal of Therapy:  Heparin level 0.3-0.7 units/ml Monitor platelets by anticoagulation protocol: Yes   Plan:  Will give heparin bolus 4000 units followed by heparin drip 1150 units/hr.  Will check HL in 6 hrs. Will continue to monitor labs, heparin level and adjust if necessary per protocol.   3/16:  HL @ 20:00 = 0.37 Will continue this pt on current rate and recheck HL on 3/17 @ 0200.   03/17 @ 0300 HL 0.40  therapeutic. Will continue current rate and will recheck w/ am labs. CBC stable.  Tobie Lords, PharmD, BCPS Clinical Pharmacist 11/26/2017

## 2017-11-26 NOTE — Progress Notes (Signed)
*  PRELIMINARY RESULTS* Echocardiogram 2D Echocardiogram has been performed.  Erik Bush 11/26/2017, 9:46 AM

## 2017-11-26 NOTE — Progress Notes (Signed)
Pts wife and sister requesting [facility] to manage pt, therefore will place order for Cardiology consult by on call [place] Cardiologist Dr. Rockey Situ in the am 11/26/17.  Marda Stalker, [place] Pager [phone] (please enter 7 digits) PCCM Consult Pager [phone] (please enter 7 digits)

## 2017-11-26 NOTE — Progress Notes (Signed)
[facility] NAME: Erik Bush    MR#:  [id number]  DATE OF BIRTH:  [birth year]  SUBJECTIVE:   Resting presently.  Had some episodes of diarrhea.  Wife in the room.  Patient complains of chest pain.  They are waiting for cardiology second opinion. REVIEW OF SYSTEMS:   Review of Systems  Constitutional: Negative for chills, fever and weight loss.  HENT: Negative for ear discharge, ear pain and nosebleeds.   Eyes: Negative for blurred vision, pain and discharge.  Respiratory: Negative for sputum production, shortness of breath, wheezing and stridor.   Cardiovascular: Positive for chest pain. Negative for palpitations, orthopnea and PND.  Gastrointestinal: Positive for diarrhea. Negative for abdominal pain, nausea and vomiting.  Genitourinary: Negative for frequency and urgency.  Musculoskeletal: Negative for back pain and joint pain.  Neurological: Negative for sensory change, speech change, focal weakness and weakness.  Psychiatric/Behavioral: Negative for depression and hallucinations. The patient is not nervous/anxious.      DRUG ALLERGIES:   Allergies  Allergen Reactions  . Aspirin Itching and Swelling  . Brussels Sprouts [Brassica Oleracea Italica] Swelling  . Other Swelling    OLIVES-SWELLING  . Morphine And Related     Had tingling fingers,chest pain,tightness after dose.    VITALS:  Blood pressure 110/73, pulse 83, temperature 98.2 F (36.8 C), temperature source Oral, resp. rate 14, height 5\' 8"  (1.727 m), weight 94.1 kg (207 lb 7.3 oz), SpO2 95 %.  PHYSICAL EXAMINATION:   Physical Exam  GENERAL:  52 y.o.-year-old patient lying in the bed with no acute distress.  EYES: Pupils equal, round, reactive to light and accommodation. No scleral icterus. Extraocular muscles intact.  HEENT: Head atraumatic, normocephalic. Oropharynx and nasopharynx clear.  NECK:  Supple, no jugular venous distention. No thyroid  enlargement, no tenderness.  LUNGS: Normal breath sounds bilaterally, no wheezing, rales, rhonchi. No use of accessory muscles of respiration.  CARDIOVASCULAR: S1, S2 normal. No murmurs, rubs, or gallops.  ABDOMEN: Soft, nontender, nondistended. Bowel sounds present. No organomegaly or mass.  EXTREMITIES: No cyanosis, clubbing or edema b/l.    NEUROLOGIC: Cranial nerves II through XII are intact. No focal Motor or sensory deficits b/l.   PSYCHIATRIC:  patient is alert and oriented x 3.  SKIN: No obvious rash, lesion, or ulcer.   LABORATORY PANEL:  CBC Recent Labs  Lab 11/26/17 0255  WBC 6.4  HGB 15.3  HCT 44.9  PLT 166    Chemistries  Recent Labs  Lab 11/26/17 0255  NA 137  K 3.5  CL 109  CO2 21*  GLUCOSE 116*  BUN 19  CREATININE 0.79  CALCIUM 8.4*  AST 25  ALT 42  ALKPHOS 77  BILITOT 1.6*   Cardiac Enzymes Recent Labs  Lab 11/26/17 0255  TROPONINI <0.03   RADIOLOGY:  Dg Chest 2 View  Result Date: 11/25/2017 CLINICAL DATA:  Shortness of breath, weakness and palpitations. EXAM: CHEST - 2 VIEW COMPARISON:  Chest radiograph 09/13/2017. FINDINGS: Monitoring leads overlie the patient. Low lung volumes. Stable cardiac and mediastinal contours. No consolidative pulmonary opacities. No pleural effusion or pneumothorax. Regional skeleton is unremarkable. IMPRESSION: Low lung volumes with basilar atelectasis. Electronically Signed   By: Lovey Newcomer M.D.   On: 11/25/2017 10:57   Ct Head Wo Contrast  Result Date: 11/25/2017 CLINICAL DATA:  52 year old with bilateral lower extremity weakness and palpitations that began this morning. EXAM: CT HEAD WITHOUT CONTRAST TECHNIQUE: Contiguous axial images were  obtained from the base of the skull through the vertex without intravenous contrast. COMPARISON:  03/09/2016. FINDINGS: Brain: Ventricular system normal in size and appearance for age. No mass lesion. No midline shift. No acute hemorrhage or hematoma. No extra-axial fluid  collections. No evidence of acute infarction. No focal brain parenchymal abnormalities. Vascular: No hyperdense vessel.  No visible atherosclerosis. Skull: No skull fracture or other focal osseous abnormality involving the skull. Sinuses/Orbits: Visualized paranasal sinuses, bilateral mastoid air cells and bilateral middle ear cavities well-aerated. Visualized orbits and globes normal in appearance. Other: Remote fracture of the medial wall of the left orbit. IMPRESSION: Normal intracranially. Electronically Signed   By: Evangeline Dakin M.D.   On: 11/25/2017 12:16   Ct Angio Chest/abd/pel For Dissection W And/or Wo Contrast  Result Date: 11/25/2017 CLINICAL DATA:  Short of breath EXAM: CT ANGIOGRAPHY CHEST, ABDOMEN AND PELVIS TECHNIQUE: Multidetector CT imaging through the chest, abdomen and pelvis was performed using the standard protocol during bolus administration of intravenous contrast. Multiplanar reconstructed images and MIPs were obtained and reviewed to evaluate the vascular anatomy. CONTRAST:  11mL ISOVUE-370 IOPAMIDOL (ISOVUE-370) INJECTION 76% COMPARISON:  09/13/2017.  03/16/2005. FINDINGS: CTA CHEST FINDINGS Cardiovascular: No evidence of intramural hematoma. No evidence of aortic dissection or aneurysm. Great vessels patent. There is no obvious acute pulmonary thromboembolism. Right coronary artery calcifications are moderately prominent. Mediastinum/Nodes: No abnormal mediastinal adenopathy. No pericardial effusion. Normal thyroid gland. Lungs/Pleura: Low volumes and subsegmental atelectasis in the dependent lungs. Musculoskeletal: No vertebral compression deformity. No acute rib fracture. Review of the MIP images confirms the above findings. CTA ABDOMEN AND PELVIS FINDINGS VASCULAR Aorta: Aorta is nonaneurysmal and patent. There is no evidence of aortic dissection. Celiac: Patent.  Accessory left hepatic artery anatomy. SMA: Patent. Renals: Single right renal artery is patent. Two left renal  arteries are patent. IMA: Patent. Inflow: Common, internal, and external iliac arteries are patent. Review of the MIP images confirms the above findings. NON-VASCULAR Hepatobiliary: Diffuse hepatic steatosis. Gallbladder is within normal limits. Pancreas: Unremarkable Spleen: Unremarkable Adrenals/Urinary Tract: Left adrenal lipoma. Right adrenal gland and kidneys are within normal limits. Bladder is decompressed. Stomach/Bowel: Stomach is distended with enteric contents. Duodenum is within normal limits. There is no evidence of small-bowel obstruction. Normal appendix. No obvious mass in the colon. There is air and fluid throughout the colon. Lymphatic: No abnormal retroperitoneal adenopathy. Reproductive: Prostate is within normal limits. Other: No free fluid. Musculoskeletal: No vertebral compression. Review of the MIP images confirms the above findings. IMPRESSION: No evidence of aortic dissection or acute vascular process. The stomach is distended of unknown significance. There is no evidence of small-bowel obstruction. Electronically Signed   By: Marybelle Killings M.D.   On: 11/25/2017 12:32   ASSESSMENT AND PLAN:  Erik Bush is a 52 y.o. male has a past medical history significant for CAD s/p MI who awoke this AM with CP, SOB, and palpitations. Also c/o HA and "head bobbing". Brought to ER where is somewhat lethargic  1.  Acute gastroenteritis secondary to [place] virus -Patient received IV fluids.  Continue symptomatic management. -Advance diet as tolerated  2.  Chest pain in the setting of history of CAD with stent x2 in January 2019 in mid and proximal RCA -EKG no acute changes.  Cardiac enzymes x4- -Continue current cardiac meds. -pt was seen by Dr. Nehemiah Massed however patient and wife want second opinion.  Await Dr. Donivan Scull recommendations  3.  Hypothyroidism continue Synthroid  4.  History of sleep apnea  Case discussed with Care Management/Social Worker. Management plans discussed  with the patient, family and they are in agreement.  CODE STATUS: full  DVT Prophylaxis: lovenox  TOTAL TIME TAKING CARE OF THIS PATIENT: *30* minutes.  >50% time spent on counselling and coordination of care  POSSIBLE D/C IN 1-2 DAYS, DEPENDING ON CLINICAL CONDITION.  Note: This dictation was prepared with Dragon dictation along with smaller phrase technology. Any transcriptional errors that result from this process are unintentional.  Fritzi Mandes M.D on 11/26/2017 at 11:51 AM  Between 7am to 6pm - Pager - [phone]  After 6pm go to [email or web address] - password [email or web address] [facility]  Office  [phone]  CC: Primary care physician; Casilda Carls, MDPatient ID: Erik Bush, male   DOB: 1965-10-01, 52 y.o.   MRN: [id number]

## 2017-11-26 NOTE — Consult Note (Signed)
Reason for Consult:Head tremor, headache Referring Physician: Posey Pronto  CC: Head tremor, headache  HPI: Erik Bush is an 52 y.o. male with a history of CAD s/p MI on January who presented to the ED with chest pain.  Patient lethargic from Ativan therefore wife reports most of the history.  Wife reports that patient awakened on yesterday with CP, SOB and palpitations.  Also complained of headache and head tremor with patient shaking head left to right.  Was felt to be lethargic to some degree on presentation.  Patient admitted at that time.  Chest pain has subsided but continues to have head tremor and headache.   Describes the headache as being bitemporal and throbbing.  Wife reports head tremor is not present when he is asleep.    Past Medical History:  Diagnosis Date  . Asthma   . Coronary artery disease   . Hypothermia   . Hypothyroidism   . Pneumonia 2016  . Sleep apnea    USES CPAP  . Wears contact lenses     Past Surgical History:  Procedure Laterality Date  . ANAL FISTULOTOMY N/A 06/08/2015   Procedure: ANAL FISTULOTOMY;  Surgeon: Christene Lye, MD;  Location: [facility];  Service: General;  Laterality: N/A;  . ANTERIOR CRUCIATE LIGAMENT REPAIR     knees  . CLOSED REDUCTION NASAL FRACTURE N/A 03/22/2016   Procedure: CLOSED REDUCTION NASAL FRACTURE;  Surgeon: Clyde Canterbury, MD;  Location: [facility];  Service: ENT;  Laterality: N/A;  sleep apnea  . LEFT HEART CATH AND CORONARY ANGIOGRAPHY N/A 09/14/2017   Procedure: LEFT HEART CATH AND CORONARY ANGIOGRAPHY and possible PCI;  Surgeon: Isaias Cowman, MD;  Location: [facility];  Service: Cardiovascular;  Laterality: N/A;  . LIPOMA EXCISION N/A 04/05/2017   Procedure: EXCISION LIPOMA Nuchal area.  prone position;  Surgeon: Jules Husbands, MD;  Location: [facility];  Service: General;  Laterality: N/A;  Prone  . MEDIAL COLLATERAL LIGAMENT REPAIR, KNEE    . SHOULDER ARTHROSCOPY Right     Family History   Problem Relation Age of Onset  . Thyroid disease Father   . Heart attack Unknown     Social History:  reports that he quit smoking about 27 years ago. His smoking use included cigarettes. He has a 12.00 pack-year smoking history. he has never used smokeless tobacco. He reports that he drinks alcohol. He reports that he does not use drugs.  Allergies  Allergen Reactions  . Aspirin Itching and Swelling  . Brussels Sprouts [Brassica Oleracea Italica] Swelling  . Other Swelling    OLIVES-SWELLING  . Morphine And Related     Had tingling fingers,chest pain,tightness after dose.    Medications:  I have reviewed the patient's current medications. Prior to Admission:  Medications Prior to Admission  Medication Sig Dispense Refill Last Dose  . atorvastatin (LIPITOR) 80 MG tablet Take 1 tablet (80 mg total) by mouth daily at 6 PM. 30 tablet 1 11/24/2017 at Unknown time  . clopidogrel (PLAVIX) 75 MG tablet Take 1 tablet (75 mg total) by mouth daily with breakfast. 30 tablet 2 11/24/2017 at Unknown time  . levothyroxine (SYNTHROID, LEVOTHROID) 125 MCG tablet Take 125 mcg by mouth daily before breakfast.   11/24/2017 at Unknown time  . metoprolol succinate (TOPROL-XL) 25 MG 24 hr tablet Take 1 tablet (25 mg total) by mouth daily. 30 tablet 1 11/24/2017 at Unknown time  . Multiple Vitamins-Minerals (MULTIVITAMIN WITH MINERALS) tablet Take 1 tablet by mouth daily.  11/24/2017 at Unknown time  . Vitamin D, Ergocalciferol, (DRISDOL) 50000 units CAPS capsule Take 1 capsule by mouth every 7 (seven) days.   0 Past Week at Unknown time   Scheduled: . atorvastatin  80 mg Oral q1800  . clopidogrel  75 mg Oral Q breakfast  . docusate sodium  100 mg Oral BID  . levothyroxine  125 mcg Oral QAC breakfast  . pantoprazole (PROTONIX) IV  40 mg Intravenous Q12H    ROS: History obtained from the patient and wife  General ROS: negative for - chills, fatigue, fever, night sweats, weight gain or weight  loss Psychological ROS: negative for - behavioral disorder, hallucinations, memory difficulties, mood swings or suicidal ideation Ophthalmic ROS: negative for - blurry vision, double vision, eye pain or loss of vision ENT ROS: negative for - epistaxis, nasal discharge, oral lesions, sore throat, tinnitus or vertigo Allergy and Immunology ROS: negative for - hives or itchy/watery eyes Hematological and Lymphatic ROS: negative for - bleeding problems, bruising or swollen lymph nodes Endocrine ROS: negative for - galactorrhea, hair pattern changes, polydipsia/polyuria or temperature intolerance Respiratory ROS: shortness of breath Cardiovascular ROS: chest pain Gastrointestinal ROS: negative for - abdominal pain, diarrhea, hematemesis, nausea/vomiting or stool incontinence Genito-Urinary ROS: negative for - dysuria, hematuria, incontinence or urinary frequency/urgency Musculoskeletal ROS: negative for - joint swelling or muscular weakness Neurological ROS: as noted in HPI Dermatological ROS: negative for rash and skin lesion changes  Physical Examination: Blood pressure 110/73, pulse 83, temperature 98.2 F (36.8 C), temperature source Oral, resp. rate 14, height '5\' 8"'$  (1.727 m), weight 94.1 kg (207 lb 7.3 oz), SpO2 95 %.  HEENT-  Normocephalic, no lesions, without obvious abnormality.  Normal external eye and conjunctiva.  Normal TM's bilaterally.  Normal auditory canals and external ears. Normal external nose, mucus membranes and septum.  Normal pharynx. Cardiovascular- S1, S2 normal, pulses palpable throughout   Lungs- chest clear, no wheezing, rales, normal symmetric air entry Abdomen- soft, non-tender; bowel sounds normal; no masses,  no organomegaly Extremities- no edema Lymph-no adenopathy palpable Musculoskeletal-no joint tenderness, deformity or swelling Skin-warm and dry, no hyperpigmentation, vitiligo, or suspicious lesions  Neurological Examination  On Ativan Mental  Status: Lethargic but arousable.  Difficult to remain aroused.  Follows commands.  Speech minimal but fluent.    Cranial Nerves: II: Discs flat bilaterally; Visual fields grossly normal, pupils equal, round, reactive to light and accommodation III,IV, VI: ptosis not present, extra-ocular motions intact bilaterally V,VII: smile symmetric, facial light touch sensation normal bilaterally VIII: hearing normal bilaterally IX,X: gag reflex present XI: bilateral shoulder shrug XII: midline tongue extension Motor: Right : Upper extremity   5/5    Left:     Upper extremity   5/5  Lower extremity   5/5     Lower extremity   5/5 Tone and bulk:normal tone throughout; no atrophy noted Sensory: Pinprick and light touch intact throughout, bilaterally Deep Tendon Reflexes: 2+ and symmetric with absent AJ's bilaterally Plantars: Right: downgoing   Left: downgoing Cerebellar: Normal finger-to-nose and normal heel-to-shin testing bilaterally. Head tremor noted in the "no-no" direction.  Disappears when he nods his head "yes" or moves his head otherwise.   Gait: not tested due to safety concerns    Laboratory Studies:   Basic Metabolic Panel: Recent Labs  Lab 11/25/17 1021 11/26/17 0255  NA 139 137  K 3.7 3.5  CL 109 109  CO2 21* 21*  GLUCOSE 145* 116*  BUN 21* 19  CREATININE 0.81 0.79  CALCIUM 9.1 8.4*    Liver Function Tests: Recent Labs  Lab 11/26/17 0255  AST 25  ALT 42  ALKPHOS 77  BILITOT 1.6*  PROT 7.2  ALBUMIN 4.0   No results for input(s): LIPASE, AMYLASE in the last 168 hours. No results for input(s): AMMONIA in the last 168 hours.  CBC: Recent Labs  Lab 11/25/17 1021 11/25/17 1406 11/26/17 0255  WBC 8.7 8.3 6.4  HGB 16.6 15.8 15.3  HCT 48.7 45.9 44.9  MCV 84.8 85.8 84.4  PLT 188 185 166    Cardiac Enzymes: Recent Labs  Lab 11/25/17 1021 11/25/17 1500 11/25/17 1946 11/26/17 0255  TROPONINI <0.03 <0.03 <0.03 <0.03    BNP: Invalid input(s):  POCBNP  CBG: Recent Labs  Lab 11/25/17 1440 11/26/17 0750  GLUCAP 116* 96    Microbiology: Results for orders placed or performed during the hospital encounter of 11/25/17  MRSA PCR Screening     Status: None   Collection Time: 11/25/17  2:41 PM  Result Value Ref Range Status   MRSA by PCR NEGATIVE NEGATIVE Final    Comment:        The GeneXpert MRSA Assay (FDA approved for NASAL specimens only), is one component of a comprehensive MRSA colonization surveillance program. It is not intended to diagnose MRSA infection nor to guide or monitor treatment for MRSA infections. Performed at [facility], [address] [place], [place] [address]   Gastrointestinal Panel by PCR , Stool     Status: Abnormal   Collection Time: 11/26/17 12:39 AM  Result Value Ref Range Status   Campylobacter species NOT DETECTED NOT DETECTED Final   Plesimonas shigelloides NOT DETECTED NOT DETECTED Final   Salmonella species NOT DETECTED NOT DETECTED Final   Yersinia enterocolitica NOT DETECTED NOT DETECTED Final   Vibrio species NOT DETECTED NOT DETECTED Final   Vibrio cholerae NOT DETECTED NOT DETECTED Final   Enteroaggregative E coli (EAEC) NOT DETECTED NOT DETECTED Final   Enteropathogenic E coli (EPEC) NOT DETECTED NOT DETECTED Final   Enterotoxigenic E coli (ETEC) NOT DETECTED NOT DETECTED Final   Shiga like toxin producing E coli (STEC) NOT DETECTED NOT DETECTED Final   Shigella/Enteroinvasive E coli (EIEC) NOT DETECTED NOT DETECTED Final   Cryptosporidium NOT DETECTED NOT DETECTED Final   Cyclospora cayetanensis NOT DETECTED NOT DETECTED Final   Entamoeba histolytica NOT DETECTED NOT DETECTED Final   Giardia lamblia NOT DETECTED NOT DETECTED Final   Adenovirus F40/41 NOT DETECTED NOT DETECTED Final   Astrovirus NOT DETECTED NOT DETECTED Final   Norovirus GI/GII DETECTED (A) NOT DETECTED Final    Comment: RESULT CALLED TO, READ BACK BY AND VERIFIED WITH: KELSEY WATTS AT 0240  11/26/17.PMH    Rotavirus A NOT DETECTED NOT DETECTED Final   Sapovirus (I, II, IV, and V) NOT DETECTED NOT DETECTED Final    Comment: Performed at [facility], [address] [place], [place] [address]  C difficile quick scan w PCR reflex     Status: None   Collection Time: 11/26/17 12:39 AM  Result Value Ref Range Status   C Diff antigen NEGATIVE NEGATIVE Final   C Diff toxin NEGATIVE NEGATIVE Final   C Diff interpretation No C. difficile detected.  Final    Comment: Performed at [facility], [address] [place], [place] [address]    Coagulation Studies: Recent Labs    11/25/17 1406  LABPROT 15.2  INR 1.21    Urinalysis: No results for input(s):  COLORURINE, LABSPEC, [place], GLUCOSEU, HGBUR, BILIRUBINUR, KETONESUR, PROTEINUR, UROBILINOGEN, NITRITE, LEUKOCYTESUR in the last 168 hours.  Invalid input(s): APPERANCEUR  Lipid Panel:     Component Value Date/Time   CHOL 188 09/14/2017 0507   TRIG 225 (H) 09/14/2017 0507   HDL 36 (L) 09/14/2017 0507   CHOLHDL 5.2 09/14/2017 0507   VLDL 45 (H) 09/14/2017 0507   LDLCALC 107 (H) 09/14/2017 0507    HgbA1C:  Lab Results  Component Value Date   HGBA1C 6.1 05/12/2014    Urine Drug Screen:  No results found for: LABOPIA, COCAINSCRNUR, LABBENZ, AMPHETMU, THCU, LABBARB  Alcohol Level: No results for input(s): ETH in the last 168 hours.  Other results: EKG: sinus tachycardia at 105 bpm with possible ST elevation  Imaging: Dg Chest 2 View  Result Date: 11/25/2017 CLINICAL DATA:  Shortness of breath, weakness and palpitations. EXAM: CHEST - 2 VIEW COMPARISON:  Chest radiograph 09/13/2017. FINDINGS: Monitoring leads overlie the patient. Low lung volumes. Stable cardiac and mediastinal contours. No consolidative pulmonary opacities. No pleural effusion or pneumothorax. Regional skeleton is unremarkable. IMPRESSION: Low lung volumes with basilar atelectasis. Electronically Signed   By: Lovey Newcomer M.D.   On:  11/25/2017 10:57   Ct Head Wo Contrast  Result Date: 11/25/2017 CLINICAL DATA:  51 year old with bilateral lower extremity weakness and palpitations that began this morning. EXAM: CT HEAD WITHOUT CONTRAST TECHNIQUE: Contiguous axial images were obtained from the base of the skull through the vertex without intravenous contrast. COMPARISON:  03/09/2016. FINDINGS: Brain: Ventricular system normal in size and appearance for age. No mass lesion. No midline shift. No acute hemorrhage or hematoma. No extra-axial fluid collections. No evidence of acute infarction. No focal brain parenchymal abnormalities. Vascular: No hyperdense vessel.  No visible atherosclerosis. Skull: No skull fracture or other focal osseous abnormality involving the skull. Sinuses/Orbits: Visualized paranasal sinuses, bilateral mastoid air cells and bilateral middle ear cavities well-aerated. Visualized orbits and globes normal in appearance. Other: Remote fracture of the medial wall of the left orbit. IMPRESSION: Normal intracranially. Electronically Signed   By: Evangeline Dakin M.D.   On: 11/25/2017 12:16   Ct Angio Chest/abd/pel For Dissection W And/or Wo Contrast  Result Date: 11/25/2017 CLINICAL DATA:  Short of breath EXAM: CT ANGIOGRAPHY CHEST, ABDOMEN AND PELVIS TECHNIQUE: Multidetector CT imaging through the chest, abdomen and pelvis was performed using the standard protocol during bolus administration of intravenous contrast. Multiplanar reconstructed images and MIPs were obtained and reviewed to evaluate the vascular anatomy. CONTRAST:  124m ISOVUE-370 IOPAMIDOL (ISOVUE-370) INJECTION 76% COMPARISON:  09/13/2017.  03/16/2005. FINDINGS: CTA CHEST FINDINGS Cardiovascular: No evidence of intramural hematoma. No evidence of aortic dissection or aneurysm. Great vessels patent. There is no obvious acute pulmonary thromboembolism. Right coronary artery calcifications are moderately prominent. Mediastinum/Nodes: No abnormal mediastinal  adenopathy. No pericardial effusion. Normal thyroid gland. Lungs/Pleura: Low volumes and subsegmental atelectasis in the dependent lungs. Musculoskeletal: No vertebral compression deformity. No acute rib fracture. Review of the MIP images confirms the above findings. CTA ABDOMEN AND PELVIS FINDINGS VASCULAR Aorta: Aorta is nonaneurysmal and patent. There is no evidence of aortic dissection. Celiac: Patent.  Accessory left hepatic artery anatomy. SMA: Patent. Renals: Single right renal artery is patent. Two left renal arteries are patent. IMA: Patent. Inflow: Common, internal, and external iliac arteries are patent. Review of the MIP images confirms the above findings. NON-VASCULAR Hepatobiliary: Diffuse hepatic steatosis. Gallbladder is within normal limits. Pancreas: Unremarkable Spleen: Unremarkable Adrenals/Urinary Tract: Left adrenal lipoma. Right adrenal gland and kidneys  are within normal limits. Bladder is decompressed. Stomach/Bowel: Stomach is distended with enteric contents. Duodenum is within normal limits. There is no evidence of small-bowel obstruction. Normal appendix. No obvious mass in the colon. There is air and fluid throughout the colon. Lymphatic: No abnormal retroperitoneal adenopathy. Reproductive: Prostate is within normal limits. Other: No free fluid. Musculoskeletal: No vertebral compression. Review of the MIP images confirms the above findings. IMPRESSION: No evidence of aortic dissection or acute vascular process. The stomach is distended of unknown significance. There is no evidence of small-bowel obstruction. Electronically Signed   By: Marybelle Killings M.D.   On: 11/25/2017 12:32     Assessment/Plan: 52 year old male presenting with CP, SOB, palpitations, headache and head tremor.  Neurological examination is nonfocal and head tremor extinguishable.  Will rule out possible shatter of emboli although this is low on the differential.  ESR and TSH normal.  Recommendations: 1.  MRI of  the brain without contrast 2. CRP  Alexis Goodell, MD Neurology [phone] 11/26/2017, 1:37 PM  .

## 2017-11-26 NOTE — Progress Notes (Signed)
Pt transferred from CCU. VSS, no complaints of pain, SR on telemetry. Enteric precautions will be maintained. I will continue to assess.

## 2017-11-26 NOTE — Progress Notes (Signed)
[facility] Cardiology [facility] Encounter Note  Patient: Erik Bush / Admit Date: 11/25/2017 / Date of Encounter: 11/26/2017, 8:25 AM   Subjective: Patient apparently feels somewhat better today.  EKG and troponin levels normal throughout the last 24 hours consistent without evidence of myocardial infarction.  Patient is tested positive for Alinda Sierras virus most consistent with his progressive symptoms over the last 24-48 hours.  Echocardiogram pending.  Patient and wife are still concerned of cardiovascular etiology of current symptoms and wishes to investigate further with acute opinion and transfer of cardiology care    Intake/Output Summary (Last 24 hours) at 11/26/2017 0825 Last data filed at 11/26/2017 0614 Gross per 24 hour  Intake 1283.41 ml  Output 525 ml  Net 758.41 ml    Inpatient Medications:  . atorvastatin  80 mg Oral q1800  . clopidogrel  75 mg Oral Q breakfast  . docusate sodium  100 mg Oral BID  . insulin aspart  0-9 Units Subcutaneous TID WC  . levothyroxine  125 mcg Oral QAC breakfast  . metoprolol succinate  25 mg Oral Daily  . pantoprazole (PROTONIX) IV  40 mg Intravenous Q12H   Infusions:  . 0.9 % NaCl with KCl 20 mEq / L 100 mL/hr at 11/26/17 0614  . heparin 1,150 Units/hr (11/26/17 0614)    Labs: Recent Labs    11/25/17 1021 11/26/17 0255  NA 139 137  K 3.7 3.5  CL 109 109  CO2 21* 21*  GLUCOSE 145* 116*  BUN 21* 19  CREATININE 0.81 0.79  CALCIUM 9.1 8.4*   Recent Labs    11/26/17 0255  AST 25  ALT 42  ALKPHOS 77  BILITOT 1.6*  PROT 7.2  ALBUMIN 4.0   Recent Labs    11/25/17 1406 11/26/17 0255  WBC 8.3 6.4  HGB 15.8 15.3  HCT 45.9 44.9  MCV 85.8 84.4  PLT 185 166   Recent Labs    11/25/17 1021 11/25/17 1500 11/25/17 1946 11/26/17 0255  TROPONINI <0.03 <0.03 <0.03 <0.03   Invalid input(s): POCBNP No results for input(s): HGBA1C in the last 72 hours.   Weights: Filed Weights   11/25/17 1013 11/25/17 1440 11/26/17 0344   Weight: 210 lb (95.3 kg) 207 lb 3.7 oz (94 kg) 207 lb 7.3 oz (94.1 kg)     Radiology/Studies:  Dg Chest 2 View  Result Date: 11/25/2017 CLINICAL DATA:  Shortness of breath, weakness and palpitations. EXAM: CHEST - 2 VIEW COMPARISON:  Chest radiograph 09/13/2017. FINDINGS: Monitoring leads overlie the patient. Low lung volumes. Stable cardiac and mediastinal contours. No consolidative pulmonary opacities. No pleural effusion or pneumothorax. Regional skeleton is unremarkable. IMPRESSION: Low lung volumes with basilar atelectasis. Electronically Signed   By: Lovey Newcomer M.D.   On: 11/25/2017 10:57   Ct Head Wo Contrast  Result Date: 11/25/2017 CLINICAL DATA:  52 year old with bilateral lower extremity weakness and palpitations that began this morning. EXAM: CT HEAD WITHOUT CONTRAST TECHNIQUE: Contiguous axial images were obtained from the base of the skull through the vertex without intravenous contrast. COMPARISON:  03/09/2016. FINDINGS: Brain: Ventricular system normal in size and appearance for age. No mass lesion. No midline shift. No acute hemorrhage or hematoma. No extra-axial fluid collections. No evidence of acute infarction. No focal brain parenchymal abnormalities. Vascular: No hyperdense vessel.  No visible atherosclerosis. Skull: No skull fracture or other focal osseous abnormality involving the skull. Sinuses/Orbits: Visualized paranasal sinuses, bilateral mastoid air cells and bilateral middle ear cavities well-aerated. Visualized orbits and globes  normal in appearance. Other: Remote fracture of the medial wall of the left orbit. IMPRESSION: Normal intracranially. Electronically Signed   By: Evangeline Dakin M.D.   On: 11/25/2017 12:16   Ct Angio Chest/abd/pel For Dissection W And/or Wo Contrast  Result Date: 11/25/2017 CLINICAL DATA:  Short of breath EXAM: CT ANGIOGRAPHY CHEST, ABDOMEN AND PELVIS TECHNIQUE: Multidetector CT imaging through the chest, abdomen and pelvis was performed  using the standard protocol during bolus administration of intravenous contrast. Multiplanar reconstructed images and MIPs were obtained and reviewed to evaluate the vascular anatomy. CONTRAST:  110mL ISOVUE-370 IOPAMIDOL (ISOVUE-370) INJECTION 76% COMPARISON:  09/13/2017.  03/16/2005. FINDINGS: CTA CHEST FINDINGS Cardiovascular: No evidence of intramural hematoma. No evidence of aortic dissection or aneurysm. Great vessels patent. There is no obvious acute pulmonary thromboembolism. Right coronary artery calcifications are moderately prominent. Mediastinum/Nodes: No abnormal mediastinal adenopathy. No pericardial effusion. Normal thyroid gland. Lungs/Pleura: Low volumes and subsegmental atelectasis in the dependent lungs. Musculoskeletal: No vertebral compression deformity. No acute rib fracture. Review of the MIP images confirms the above findings. CTA ABDOMEN AND PELVIS FINDINGS VASCULAR Aorta: Aorta is nonaneurysmal and patent. There is no evidence of aortic dissection. Celiac: Patent.  Accessory left hepatic artery anatomy. SMA: Patent. Renals: Single right renal artery is patent. Two left renal arteries are patent. IMA: Patent. Inflow: Common, internal, and external iliac arteries are patent. Review of the MIP images confirms the above findings. NON-VASCULAR Hepatobiliary: Diffuse hepatic steatosis. Gallbladder is within normal limits. Pancreas: Unremarkable Spleen: Unremarkable Adrenals/Urinary Tract: Left adrenal lipoma. Right adrenal gland and kidneys are within normal limits. Bladder is decompressed. Stomach/Bowel: Stomach is distended with enteric contents. Duodenum is within normal limits. There is no evidence of small-bowel obstruction. Normal appendix. No obvious mass in the colon. There is air and fluid throughout the colon. Lymphatic: No abnormal retroperitoneal adenopathy. Reproductive: Prostate is within normal limits. Other: No free fluid. Musculoskeletal: No vertebral compression. Review of the  MIP images confirms the above findings. IMPRESSION: No evidence of aortic dissection or acute vascular process. The stomach is distended of unknown significance. There is no evidence of small-bowel obstruction. Electronically Signed   By: Marybelle Killings M.D.   On: 11/25/2017 12:32     Assessment and Recommendation  51 y.o. male with known coronary artery disease status post recent non-ST elevation myocardial infarction with PCI and stent placement of right coronary artery having severe new onset of atypical chest discomfort with point tenderness of his left chest and no current evidence of myocardial infarction by EKG or troponin levels lightly improved today with new diagnosis of noro virus 1.  Transfer cardiac care for second opinion further treatment of chest discomfort  Signed, Serafina Royals M.D. FACC

## 2017-11-26 NOTE — Consult Note (Addendum)
Cardiology Consultation:   Patient ID: Markie Frith; [id number]; 07/24/1966   Admit date: 11/25/2017 Date of Consult: 11/26/2017  Primary Care Provider: Casilda Carls, MD Primary Cardiologist: new to [facility], followed by Jefm Bryant Physician requesting consult: Dr. Posey Pronto Reason for consult :second opinion regarding chest pain symptoms in Patient with known coronary artery disease, recent MI   Patient Profile:   Ercil Cassis is a 52 y.o. male with a hx of occluded proximal RCA, stent x2 placed to the RCA in January 2019,   History of Present Illness:   Mr. Bonano presented on March 16 with shortness of breath, weakness, palpitations, "legs giving out", appeared pale and anxious, reporting that he felt similar to when he had his heart attack.  In the emergency room developed left side chest pain Patient's wife calling out in the emergency room because the doctor had not been in for evaluation Emergency room Dr.reported he was having 6 hours of shortness of breath left side chest pain radiating to his back and neck and head Weakness, diarrhea Reports compliance with his Plavix Former smoker 2 packs a day Pain relieved with nitroglycerin but then pain came back Case was discussed with Dr.arida and Nehemiah Massed who recommended heparin infusion and nitroglycerin He had 3 EKGs in the emergency department without significant change Initial troponin was negative Recommendation to admit to the hospital for further evaluation, diagnosis unstable angina Up on the floor reported that he could not breathe placed on nasal cannula oxygen  CT scan performed, stomach distended otherwise no aortic dissection or acute vascular process  Patient was complaining of head tremors Nitro drip held for hypotension Neurology was consult and by admitting physician for headache and head bobbing  Patient also reported having right lower extremity numbness History of sleep apnea and uses home CPAP per the  notes  Seen by cardiology March 16 in the afternoon Reported to have central and left chest rib pain, severe tenderness on palpation especially when taking a deep breath Chest pain worse after coughing,   Diarrhea tested positive for noro virus   Past Medical History:  Diagnosis Date  . Asthma   . Coronary artery disease   . Hypothermia   . Hypothyroidism   . Pneumonia 2016  . Sleep apnea    USES CPAP  . Wears contact lenses     Past Surgical History:  Procedure Laterality Date  . ANAL FISTULOTOMY N/A 06/08/2015   Procedure: ANAL FISTULOTOMY;  Surgeon: Christene Lye, MD;  Location: [facility];  Service: General;  Laterality: N/A;  . ANTERIOR CRUCIATE LIGAMENT REPAIR     knees  . CLOSED REDUCTION NASAL FRACTURE N/A 03/22/2016   Procedure: CLOSED REDUCTION NASAL FRACTURE;  Surgeon: Clyde Canterbury, MD;  Location: [facility];  Service: ENT;  Laterality: N/A;  sleep apnea  . LEFT HEART CATH AND CORONARY ANGIOGRAPHY N/A 09/14/2017   Procedure: LEFT HEART CATH AND CORONARY ANGIOGRAPHY and possible PCI;  Surgeon: Isaias Cowman, MD;  Location: [facility];  Service: Cardiovascular;  Laterality: N/A;  . LIPOMA EXCISION N/A 04/05/2017   Procedure: EXCISION LIPOMA Nuchal area.  prone position;  Surgeon: Jules Husbands, MD;  Location: [facility];  Service: General;  Laterality: N/A;  Prone  . MEDIAL COLLATERAL LIGAMENT REPAIR, KNEE    . SHOULDER ARTHROSCOPY Right      Home Medications:  Prior to Admission medications   Medication Sig Start Date End Date Taking? Authorizing Provider  atorvastatin (LIPITOR) 80 MG tablet Take 1 tablet (80  mg total) by mouth daily at 6 PM. 09/15/17  Yes Fritzi Mandes, MD  clopidogrel (PLAVIX) 75 MG tablet Take 1 tablet (75 mg total) by mouth daily with breakfast. 09/16/17  Yes Fritzi Mandes, MD  levothyroxine (SYNTHROID, LEVOTHROID) 125 MCG tablet Take 125 mcg by mouth daily before breakfast.   Yes [provider]  metoprolol  succinate (TOPROL-XL) 25 MG 24 hr tablet Take 1 tablet (25 mg total) by mouth daily. 09/15/17  Yes Fritzi Mandes, MD  Multiple Vitamins-Minerals (MULTIVITAMIN WITH MINERALS) tablet Take 1 tablet by mouth daily.   Yes [provider]  Vitamin D, Ergocalciferol, (DRISDOL) 50000 units CAPS capsule Take 1 capsule by mouth every 7 (seven) days.  10/06/17  Yes [provider]    Inpatient Medications: Scheduled Meds: . atorvastatin  80 mg Oral q1800  . clopidogrel  75 mg Oral Q breakfast  . docusate sodium  100 mg Oral BID  . levothyroxine  125 mcg Oral QAC breakfast  . metoprolol succinate  25 mg Oral Daily  . pantoprazole (PROTONIX) IV  40 mg Intravenous Q12H   Continuous Infusions: . 0.9 % NaCl with KCl 20 mEq / L 100 mL/hr at 11/26/17 0614  . heparin 1,150 Units/hr (11/26/17 0614)   PRN Meds: acetaminophen **OR** acetaminophen, bisacodyl, HYDROcodone-acetaminophen, LORazepam, nitroGLYCERIN, ondansetron **OR** ondansetron (ZOFRAN) IV  Allergies:    Allergies  Allergen Reactions  . Aspirin Itching and Swelling  . Brussels Sprouts [Brassica Oleracea Italica] Swelling  . Other Swelling    OLIVES-SWELLING  . Morphine And Related     Had tingling fingers,chest pain,tightness after dose.    Social History:   Social History   Socioeconomic History  . Marital status: Single    Spouse name: Not on file  . Number of children: Not on file  . Years of education: Not on file  . Highest education level: Not on file  Social Needs  . Financial resource strain: Not on file  . Food insecurity - worry: Not on file  . Food insecurity - inability: Not on file  . Transportation needs - medical: Not on file  . Transportation needs - non-medical: Not on file  Occupational History    Employer: [employer]  Tobacco Use  . Smoking status: Former Smoker    Packs/day: 2.00    Years: 6.00    Pack years: 12.00    Types: Cigarettes    Last attempt to quit: 09/12/1990    Years  since quitting: 27.2  . Smokeless tobacco: Never Used  Substance and Sexual Activity  . Alcohol use: Yes    Alcohol/week: 0.0 oz    Comment: occassionally  . Drug use: No  . Sexual activity: Not on file  Other Topics Concern  . Not on file  Social History Narrative  . Not on file    Family History:    Family History  Problem Relation Age of Onset  . Thyroid disease Father   . Heart attack Unknown      ROS:  Please see the history of present illness.  Review of Systems  Constitutional: Positive for malaise/fatigue.  Respiratory: Positive for cough and shortness of breath.   Cardiovascular: Positive for chest pain.  Gastrointestinal: Positive for diarrhea.  Musculoskeletal: Negative.   Neurological: Positive for weakness.  Psychiatric/Behavioral: Negative.   All other systems reviewed and are negative.    Physical Exam/Data:   Vitals:   11/26/17 0400 11/26/17 0500 11/26/17 0600 11/26/17 0800  BP: 99/65 97/65 102/61 112/77  Pulse: 89 85 95 83  Resp: (!) 21 20 19  (!) 21  Temp: 98.2 F (36.8 C)   98.5 F (36.9 C)  TempSrc: Oral   Oral  SpO2: 93% 93% 95% 96%  Weight:      Height:        Intake/Output Summary (Last 24 hours) at 11/26/2017 0955 Last data filed at 11/26/2017 7824 Gross per 24 hour  Intake 1283.41 ml  Output 525 ml  Net 758.41 ml   Filed Weights   11/25/17 1013 11/25/17 1440 11/26/17 0344  Weight: 210 lb (95.3 kg) 207 lb 3.7 oz (94 kg) 207 lb 7.3 oz (94.1 kg)   Body mass index is 31.54 kg/m.  General:  Well nourished, well developed, week, appears ill/lethargic HEENT: normal Lymph: no adenopathy Neck: no JVD Endocrine:  No thryomegaly Vascular: No carotid bruits; FA pulses 2+ bilaterally without bruits  Cardiac:  normal S1, S2; RRR; no murmur  Lungs:  clear to auscultation bilaterally, no wheezing, rhonchi or rales  Abd: soft, nontender, no hepatomegaly  Ext: no edema Musculoskeletal:  No deformities, BUE and BLE strength normal and  equal Left chest pain tender on palpation Skin: warm and dry  Neuro:  CNs 2-12 intact, no focal abnormalities noted Psych:  Lethargic, appears weak  EKG:  The EKG was personally reviewed and demonstrates:  Normal sinus rhythm rate 10 5 bpm old inferior MI, nonspecific ST abnormality Telemetry:  Telemetry was personally reviewed and demonstrates:  Normal sinus rhythm  Relevant CV Studies:  Echocardiogram with normal LV function, suspected basal inferior wall hypokinesis Ejection fraction 55%  Laboratory Data:  Chemistry Recent Labs  Lab 11/25/17 1021 11/26/17 0255  NA 139 137  K 3.7 3.5  CL 109 109  CO2 21* 21*  GLUCOSE 145* 116*  BUN 21* 19  CREATININE 0.81 0.79  CALCIUM 9.1 8.4*  GFRNONAA >60 >60  GFRAA >60 >60  ANIONGAP 9 7    Recent Labs  Lab 11/26/17 0255  PROT 7.2  ALBUMIN 4.0  AST 25  ALT 42  ALKPHOS 77  BILITOT 1.6*   Hematology Recent Labs  Lab 11/25/17 1021 11/25/17 1406 11/26/17 0255  WBC 8.7 8.3 6.4  RBC 5.74 5.35 5.31  HGB 16.6 15.8 15.3  HCT 48.7 45.9 44.9  MCV 84.8 85.8 84.4  MCH 28.9 29.6 28.9  MCHC 34.1 34.4 34.2  RDW 14.5 14.8* 14.7*  PLT 188 185 166   Cardiac Enzymes Recent Labs  Lab 11/25/17 1021 11/25/17 1500 11/25/17 1946 11/26/17 0255  TROPONINI <0.03 <0.03 <0.03 <0.03   No results for input(s): TROPIPOC in the last 168 hours.  BNPNo results for input(s): BNP, PROBNP in the last 168 hours.  DDimer No results for input(s): DDIMER in the last 168 hours.  Radiology/Studies:  Dg Chest 2 View  Result Date: 11/25/2017 CLINICAL DATA:  Shortness of breath, weakness and palpitations. EXAM: CHEST - 2 VIEW COMPARISON:  Chest radiograph 09/13/2017. FINDINGS: Monitoring leads overlie the patient. Low lung volumes. Stable cardiac and mediastinal contours. No consolidative pulmonary opacities. No pleural effusion or pneumothorax. Regional skeleton is unremarkable. IMPRESSION: Low lung volumes with basilar atelectasis. Electronically  Signed   By: Lovey Newcomer M.D.   On: 11/25/2017 10:57   Ct Head Wo Contrast  Result Date: 11/25/2017 CLINICAL DATA:  52 year old with bilateral lower extremity weakness and palpitations that began this morning. EXAM: CT HEAD WITHOUT CONTRAST TECHNIQUE: Contiguous axial images were obtained from the base of the skull through the vertex without intravenous  contrast. COMPARISON:  03/09/2016. FINDINGS: Brain: Ventricular system normal in size and appearance for age. No mass lesion. No midline shift. No acute hemorrhage or hematoma. No extra-axial fluid collections. No evidence of acute infarction. No focal brain parenchymal abnormalities. Vascular: No hyperdense vessel.  No visible atherosclerosis. Skull: No skull fracture or other focal osseous abnormality involving the skull. Sinuses/Orbits: Visualized paranasal sinuses, bilateral mastoid air cells and bilateral middle ear cavities well-aerated. Visualized orbits and globes normal in appearance. Other: Remote fracture of the medial wall of the left orbit. IMPRESSION: Normal intracranially. Electronically Signed   By: Evangeline Dakin M.D.   On: 11/25/2017 12:16   Ct Angio Chest/abd/pel For Dissection W And/or Wo Contrast  Result Date: 11/25/2017 CLINICAL DATA:  Short of breath EXAM: CT ANGIOGRAPHY CHEST, ABDOMEN AND PELVIS TECHNIQUE: Multidetector CT imaging through the chest, abdomen and pelvis was performed using the standard protocol during bolus administration of intravenous contrast. Multiplanar reconstructed images and MIPs were obtained and reviewed to evaluate the vascular anatomy. CONTRAST:  130mL ISOVUE-370 IOPAMIDOL (ISOVUE-370) INJECTION 76% COMPARISON:  09/13/2017.  03/16/2005. FINDINGS: CTA CHEST FINDINGS Cardiovascular: No evidence of intramural hematoma. No evidence of aortic dissection or aneurysm. Great vessels patent. There is no obvious acute pulmonary thromboembolism. Right coronary artery calcifications are moderately prominent.  Mediastinum/Nodes: No abnormal mediastinal adenopathy. No pericardial effusion. Normal thyroid gland. Lungs/Pleura: Low volumes and subsegmental atelectasis in the dependent lungs. Musculoskeletal: No vertebral compression deformity. No acute rib fracture. Review of the MIP images confirms the above findings. CTA ABDOMEN AND PELVIS FINDINGS VASCULAR Aorta: Aorta is nonaneurysmal and patent. There is no evidence of aortic dissection. Celiac: Patent.  Accessory left hepatic artery anatomy. SMA: Patent. Renals: Single right renal artery is patent. Two left renal arteries are patent. IMA: Patent. Inflow: Common, internal, and external iliac arteries are patent. Review of the MIP images confirms the above findings. NON-VASCULAR Hepatobiliary: Diffuse hepatic steatosis. Gallbladder is within normal limits. Pancreas: Unremarkable Spleen: Unremarkable Adrenals/Urinary Tract: Left adrenal lipoma. Right adrenal gland and kidneys are within normal limits. Bladder is decompressed. Stomach/Bowel: Stomach is distended with enteric contents. Duodenum is within normal limits. There is no evidence of small-bowel obstruction. Normal appendix. No obvious mass in the colon. There is air and fluid throughout the colon. Lymphatic: No abnormal retroperitoneal adenopathy. Reproductive: Prostate is within normal limits. Other: No free fluid. Musculoskeletal: No vertebral compression. Review of the MIP images confirms the above findings. IMPRESSION: No evidence of aortic dissection or acute vascular process. The stomach is distended of unknown significance. There is no evidence of small-bowel obstruction. Electronically Signed   By: Marybelle Killings M.D.   On: 11/25/2017 12:32    Assessment and Plan:   1. Chest pain Atypical and typical features On today's evaluation reproducible with palpation and deep inspiration -Wife reports yesterday was less musculoskeletal, more concerning for cardiac -Cardiac enzymes negative 3, EKG with  nonspecific ST T wave abnormality also noted precordial leads -echocardiogram looks grossly unchanged results above discussed with patient and wife in detail We did discuss cardiac catheterization, risk and benefit Recommend we see how the next 24 hours ago, he is still recovering from noro virus Has tremendous weakness, abdominal discomfort, not eating Would recommend given Cardec enzymes negative, no hyperacute EKG changes, normal echocardiogram we delay any cardiac workupsuch as catheterization ---Suggested to patient and wife that we readdressed in the morning to see how he is feeling ---he will move to the floor, we'll need to check orthostatics May need assistance to the  bathroom, still having diarrhea  2) Cad, PCI  January 2019 with 2 stents to the proximal RCA Patient and wife concerned that recent chest pain symptoms are consistent with previous MI Discussed negative cardiac enzymes, normal echo Did not rule out cardiac catheterization this hospitalization if chest pain persists Will hold heparin for now given negative cardiac enzymes 4 --consider changing to brilinta load and then 90 BID He has allergy to asa,  Discuss with dr. Fletcher Anon on Monday  3) Noro virus  suspect this is contributing to the majority of his symptoms Weakness, diarrhea, neurologic issues Even had elevated total bilirubin on arrival Weak this morning Will need supportive care likely additional day or 2 Still not eating  4) cough  etiology unclear, Likely contributing to his left side chest pain reproducible with palpation Will need to monitor closely, unable to exclude early bronchitis   Total encounter time more than 110 minutes  Greater than 50% was spent in counseling and coordination of care with the patient    For questions or updates, please contact [place] Please consult [email or web address] for contact info under Cardiology/STEMI.   Signed, Ida Rogue, MD  11/26/2017 9:55 AM

## 2017-11-27 ENCOUNTER — Inpatient Hospital Stay: Payer: BLUE CROSS/BLUE SHIELD

## 2017-11-27 DIAGNOSIS — R0789 Other chest pain: Principal | ICD-10-CM

## 2017-11-27 LAB — CBC
HCT: 41 % (ref 40.0–52.0)
Hemoglobin: 13.8 g/dL (ref 13.0–18.0)
MCH: 28.5 pg (ref 26.0–34.0)
MCHC: 33.6 g/dL (ref 32.0–36.0)
MCV: 85 fL (ref 80.0–100.0)
Platelets: 162 10*3/uL (ref 150–440)
RBC: 4.82 MIL/uL (ref 4.40–5.90)
RDW: 14.7 % — AB (ref 11.5–14.5)
WBC: 4.7 10*3/uL (ref 3.8–10.6)

## 2017-11-27 LAB — BASIC METABOLIC PANEL
Anion gap: 6 (ref 5–15)
BUN: 8 mg/dL (ref 6–20)
CALCIUM: 8.4 mg/dL — AB (ref 8.9–10.3)
CO2: 22 mmol/L (ref 22–32)
CREATININE: 0.67 mg/dL (ref 0.61–1.24)
Chloride: 106 mmol/L (ref 101–111)
GFR calc Af Amer: >60 mL/min (ref >60)
GFR calc non Af Amer: >60 mL/min (ref >60)
GLUCOSE: 110 mg/dL — AB (ref 65–99)
Potassium: 3.8 mmol/L (ref 3.5–5.1)
Sodium: 134 mmol/L — ABNORMAL LOW (ref 135–145)

## 2017-11-27 LAB — MAGNESIUM: Magnesium: 1.9 mg/dL (ref 1.7–2.4)

## 2017-11-27 LAB — GLUCOSE, CAPILLARY: Glucose-Capillary: 84 mg/dL (ref 65–99)

## 2017-11-27 LAB — C-REACTIVE PROTEIN: CRP: 2.9 mg/dL — AB (ref <1.0)

## 2017-11-27 NOTE — Progress Notes (Signed)
To whom it may concern:  Mr Erik Bush was admitted to [facility] on 11/25/17 and discharged on 11/27/17. Mr Erik Bush may return to work on Wednesday 11/29/17 per Doctor order with no restrictions.    Thank You  Thomas Hoff RN Dr. Max Sane 318/19

## 2017-11-27 NOTE — Progress Notes (Signed)
Progress Note  Patient Name: Erik Bush Date of Encounter: 11/27/2017  Primary Cardiologist: new to [facility] - consult by Gollan  Subjective   No further chest pain. Reports this pain did not feel like the pain he had when he underwent PCI in 09/2017. This current pain is worse with deep inspiration and coughing. Has not missed any doses of DAPT.   Continues to note watery diarrhea.  Potassium 3.5 on 11/26/17.  Inpatient Medications    Scheduled Meds: . atorvastatin  80 mg Oral q1800  . clopidogrel  75 mg Oral Q breakfast  . docusate sodium  100 mg Oral BID  . levothyroxine  125 mcg Oral QAC breakfast  . pantoprazole (PROTONIX) IV  40 mg Intravenous Q12H   Continuous Infusions: . 0.9 % NaCl with KCl 20 mEq / L 100 mL/hr at 11/26/17 1629   PRN Meds: acetaminophen **OR** acetaminophen, bisacodyl, HYDROcodone-acetaminophen, LORazepam, nitroGLYCERIN, ondansetron **OR** ondansetron (ZOFRAN) IV   Vital Signs    Vitals:   11/26/17 1144 11/26/17 1148 11/26/17 1941 11/27/17 0349  BP: 107/68 110/73 112/68 106/70  Pulse: 74 83 74 66  Resp: 14  18 18   Temp: 98.2 F (36.8 C)  98.3 F (36.8 C) 98.4 F (36.9 C)  TempSrc: Oral  Oral Oral  SpO2: 95%  94% 96%  Weight:    209 lb 14.4 oz (95.2 kg)  Height:        Intake/Output Summary (Last 24 hours) at 11/27/2017 0734 Last data filed at 11/27/2017 0500 Gross per 24 hour  Intake 2246 ml  Output 0 ml  Net 2246 ml   Filed Weights   11/25/17 1440 11/26/17 0344 11/27/17 0349  Weight: 207 lb 3.7 oz (94 kg) 207 lb 7.3 oz (94.1 kg) 209 lb 14.4 oz (95.2 kg)    Telemetry    NSR - Personally Reviewed  ECG    n/a - Personally Reviewed  Physical Exam   GEN: No acute distress.   Neck: No JVD. Cardiac: RRR, no murmurs, rubs, or gallops.  Respiratory: Clear to auscultation bilaterally.  GI: Soft, nontender, non-distended.   MS: No edema; No deformity. Neuro:  Alert and oriented x 3; Nonfocal.  Psych: Normal affect.  Labs      Chemistry Recent Labs  Lab 11/25/17 1021 11/26/17 0255  NA 139 137  K 3.7 3.5  CL 109 109  CO2 21* 21*  GLUCOSE 145* 116*  BUN 21* 19  CREATININE 0.81 0.79  CALCIUM 9.1 8.4*  PROT  --  7.2  ALBUMIN  --  4.0  AST  --  25  ALT  --  42  ALKPHOS  --  77  BILITOT  --  1.6*  GFRNONAA >60 >60  GFRAA >60 >60  ANIONGAP 9 7     Hematology Recent Labs  Lab 11/25/17 1406 11/26/17 0255 11/27/17 0540  WBC 8.3 6.4 4.7  RBC 5.35 5.31 4.82  HGB 15.8 15.3 13.8  HCT 45.9 44.9 41.0  MCV 85.8 84.4 85.0  MCH 29.6 28.9 28.5  MCHC 34.4 34.2 33.6  RDW 14.8* 14.7* 14.7*  PLT 185 166 162    Cardiac Enzymes Recent Labs  Lab 11/25/17 1021 11/25/17 1500 11/25/17 1946 11/26/17 0255  TROPONINI <0.03 <0.03 <0.03 <0.03   No results for input(s): TROPIPOC in the last 168 hours.   BNPNo results for input(s): BNP, PROBNP in the last 168 hours.   DDimer No results for input(s): DDIMER in the last 168 hours.   Radiology  Dg Chest 2 View  Result Date: 11/25/2017 CLINICAL DATA:  Shortness of breath, weakness and palpitations. EXAM: CHEST - 2 VIEW COMPARISON:  Chest radiograph 09/13/2017. FINDINGS: Monitoring leads overlie the patient. Low lung volumes. Stable cardiac and mediastinal contours. No consolidative pulmonary opacities. No pleural effusion or pneumothorax. Regional skeleton is unremarkable. IMPRESSION: Low lung volumes with basilar atelectasis. Electronically Signed   By: Lovey Newcomer M.D.   On: 11/25/2017 10:57   Ct Head Wo Contrast  Result Date: 11/25/2017 IMPRESSION: Normal intracranially. Electronically Signed   By: Evangeline Dakin M.D.   On: 11/25/2017 12:16   Ct Angio Chest/abd/pel For Dissection W And/or Wo Contrast  Result Date: 11/25/2017 IMPRESSION: No evidence of aortic dissection or acute vascular process. The stomach is distended of unknown significance. There is no evidence of small-bowel obstruction. Electronically Signed   By: Marybelle Killings M.D.   On:  11/25/2017 12:32    Cardiac Studies   TTE 11/26/17: Study Conclusions  - Left ventricle: The cavity size was normal. Systolic function was   normal. The estimated ejection fraction was in the range of 60%   to 65%. Wall motion was grossly normal; Mild basal inferior wall   hypokinesis. Doppler parameters are consistent with abnormal left   ventricular relaxation (grade 1 diastolic dysfunction). - Left atrium: The atrium was normal in size. - Right ventricle: Systolic function was normal. - Pulmonary arteries: Systolic pressure was within the normal   range.  Impressions:  - Wall motion unchanged from prior study in 09/2017.   LHC 09/14/17: Coronary Findings   Diagnostic  Dominance: Right  Left Circumflex  Ost Cx to Prox Cx lesion 30% stenosed  Ost Cx to Prox Cx lesion is 30% stenosed. The lesion is tubular.  Mid Cx lesion 20% stenosed  Mid Cx lesion is 20% stenosed. The lesion is tubular.  Right Coronary Artery  Prox RCA to Mid RCA lesion 100% stenosed  Prox RCA to Mid RCA lesion is 100% stenosed.  Intervention   Prox RCA to Mid RCA lesion  Stent  Lesion crossed with guidewire. Pre-stent angioplasty was performed using a BALLOON TREK RX 2.5X15. A drug-eluting stent was successfully placed using a STENT SIERRA 4.00 X 33 MM. Stent strut is well apposed. Stent does not overlap previously placed stentPost-stent angioplasty was performed using a BALLOON [place] TREK RX 4.5X20.  Stent  Lesion crossed with guidewire. Pre-stent angioplasty was performed using a BALLOON TREK RX 2.5X15. A drug-eluting stent was successfully placed using a STENT SIERRA 4.00 X 12 MM. Stent strut is well apposed. Stent overlaps previously placed stent. Post-stent angioplasty was performed using a BALLOON [place] TREK RX 4.5X20.  Post-Intervention Lesion Assessment  There is no residual stenosis post intervention.  Wall Motion              Coronary Diagrams   Diagnostic Diagram         Post-Intervention Diagram         Conclusion     Ost Cx to Prox Cx lesion is 30% stenosed.  Mid Cx lesion is 20% stenosed.  Prox RCA to Mid RCA lesion is 100% stenosed.  Post intervention, there is a 0% residual stenosis.  A drug-eluting stent was successfully placed using a STENT SIERRA 4.00 X 33 MM.  A drug-eluting stent was successfully placed using a STENT SIERRA 4.00 X 12 MM.   1.  One-vessel CAD with occluded proximal RCA 2.  Mildly reduced left ventricular function with inferior wall hypokinesis 3.  Successful PCI with overlapping DES stents proximal mid RCA     Patient Profile     52 y.o. male with history of CAD s/p recent PCI/DES to the RCA as above, HLD, asthma, hypothyroidism, and OSA on CPAP who presented to [facility] with chest pain and was found to have norovirus.   Assessment & Plan    1. Atypical chest pain/CAD of native coronary artery as above: -Currently chest pain free -Has ruled out -Echo unchanged -Pain is worse with deep inspiration and coughing -Likely no further inpatient cardiac workup needed at this time -Recommend management of the patient's acute illness -Continue DAPT -Aggressive risk factor modification and secondary prevention   2. Hedache/tremor: -Neurology on board  -MRI brain pending  3. Norovirus: -Supportive care -Check bmet and magnesium this morning with recommendation to replete lytes to goal -Per IM  4. HLD: -Lipitor -Goal LDL, 70 -Needs follow up lipid and liver in the office  5. Cough: -Likely contributing to his chest pain -Per IM  For questions or updates, please contact [place] Please consult [email or web address] for contact info under Cardiology/STEMI.    Signed, Christell Faith, PA-C [place] Pager: [phone] 11/27/2017, 7:34 AM

## 2017-11-27 NOTE — Discharge Instructions (Signed)

## 2017-11-27 NOTE — Progress Notes (Signed)
Subjective: Patient awake and alert.  No further head tremor noted.  No further chest pain  Objective: Current vital signs: BP 103/65   Pulse 65   Temp 98.5 F (36.9 C) (Oral)   Resp 18   Ht 5\' 8"  (1.727 m)   Wt 95.2 kg (209 lb 14.4 oz)   SpO2 96%   BMI 31.92 kg/m  Vital signs in last 24 hours: Temp:  [98.2 F (36.8 C)-98.5 F (36.9 C)] 98.5 F (36.9 C) (03/18 0855) Pulse Rate:  [65-83] 65 (03/18 0855) Resp:  [14-18] 18 (03/18 0349) BP: (103-112)/(65-73) 103/65 (03/18 0855) SpO2:  [94 %-96 %] 96 % (03/18 0855) Weight:  [95.2 kg (209 lb 14.4 oz)] 95.2 kg (209 lb 14.4 oz) (03/18 0349)  Intake/Output from previous day: 03/17 0701 - 03/18 0700 In: 2246 [I.V.:2246] Out: 0  Intake/Output this shift: No intake/output data recorded. Nutritional status: Diet clear liquid Room service appropriate? Yes; Fluid consistency: Thin  Neurologic Exam: Mental Status: Alert and oriented.  Follows commands.  Speech minimal but fluent.    Cranial Nerves: II: Discs flat bilaterally; Visual fields grossly normal, pupils equal, round, reactive to light and accommodation III,IV, VI: ptosis not present, extra-ocular motions intact bilaterally V,VII: smile symmetric, facial light touch sensation normal bilaterally VIII: hearing normal bilaterally IX,X: gag reflex present XI: bilateral shoulder shrug XII: midline tongue extension Motor: Right :  Upper extremity   5/5                                      Left:     Upper extremity   5/5             Lower extremity   5/5                                                  Lower extremity   5/5 Tone and bulk:normal tone throughout; no atrophy noted Sensory: Pinprick and light touch intact throughout, bilaterally Deep Tendon Reflexes: 2+ and symmetric with absent AJ's bilaterally Plantars: Right: downgoing                                Left: downgoing Cerebellar: Normal finger-to-nose and normal heel-to-shin testing bilaterally.   Lab  Results: Basic Metabolic Panel: Recent Labs  Lab 11/25/17 1021 11/26/17 0255 11/27/17 0920  NA 139 137 134*  K 3.7 3.5 3.8  CL 109 109 106  CO2 21* 21* 22  GLUCOSE 145* 116* 110*  BUN 21* 19 8  CREATININE 0.81 0.79 0.67  CALCIUM 9.1 8.4* 8.4*  MG  --   --  1.9    Liver Function Tests: Recent Labs  Lab 11/26/17 0255  AST 25  ALT 42  ALKPHOS 77  BILITOT 1.6*  PROT 7.2  ALBUMIN 4.0   No results for input(s): LIPASE, AMYLASE in the last 168 hours. No results for input(s): AMMONIA in the last 168 hours.  CBC: Recent Labs  Lab 11/25/17 1021 11/25/17 1406 11/26/17 0255 11/27/17 0540  WBC 8.7 8.3 6.4 4.7  HGB 16.6 15.8 15.3 13.8  HCT 48.7 45.9 44.9 41.0  MCV 84.8 85.8 84.4 85.0  PLT 188 185 166 162    Cardiac  Enzymes: Recent Labs  Lab 11/25/17 1021 11/25/17 1500 11/25/17 1946 11/26/17 0255  TROPONINI <0.03 <0.03 <0.03 <0.03    Lipid Panel: No results for input(s): CHOL, TRIG, HDL, CHOLHDL, VLDL, LDLCALC in the last 168 hours.  CBG: Recent Labs  Lab 11/25/17 1440 11/26/17 0750 11/27/17 0806  GLUCAP 116* 96 84    Microbiology: Results for orders placed or performed during the hospital encounter of 11/25/17  MRSA PCR Screening     Status: None   Collection Time: 11/25/17  2:41 PM  Result Value Ref Range Status   MRSA by PCR NEGATIVE NEGATIVE Final    Comment:        The GeneXpert MRSA Assay (FDA approved for NASAL specimens only), is one component of a comprehensive MRSA colonization surveillance program. It is not intended to diagnose MRSA infection nor to guide or monitor treatment for MRSA infections. Performed at [facility], [address] [place], [place] [address]   Gastrointestinal Panel by PCR , Stool     Status: Abnormal   Collection Time: 11/26/17 12:39 AM  Result Value Ref Range Status   Campylobacter species NOT DETECTED NOT DETECTED Final   Plesimonas shigelloides NOT DETECTED NOT DETECTED Final   Salmonella  species NOT DETECTED NOT DETECTED Final   Yersinia enterocolitica NOT DETECTED NOT DETECTED Final   Vibrio species NOT DETECTED NOT DETECTED Final   Vibrio cholerae NOT DETECTED NOT DETECTED Final   Enteroaggregative E coli (EAEC) NOT DETECTED NOT DETECTED Final   Enteropathogenic E coli (EPEC) NOT DETECTED NOT DETECTED Final   Enterotoxigenic E coli (ETEC) NOT DETECTED NOT DETECTED Final   Shiga like toxin producing E coli (STEC) NOT DETECTED NOT DETECTED Final   Shigella/Enteroinvasive E coli (EIEC) NOT DETECTED NOT DETECTED Final   Cryptosporidium NOT DETECTED NOT DETECTED Final   Cyclospora cayetanensis NOT DETECTED NOT DETECTED Final   Entamoeba histolytica NOT DETECTED NOT DETECTED Final   Giardia lamblia NOT DETECTED NOT DETECTED Final   Adenovirus F40/41 NOT DETECTED NOT DETECTED Final   Astrovirus NOT DETECTED NOT DETECTED Final   Norovirus GI/GII DETECTED (A) NOT DETECTED Final    Comment: RESULT CALLED TO, READ BACK BY AND VERIFIED WITH: KELSEY WATTS AT 0240 11/26/17.PMH    Rotavirus A NOT DETECTED NOT DETECTED Final   Sapovirus (I, II, IV, and V) NOT DETECTED NOT DETECTED Final    Comment: Performed at [facility], [address] [place], [place] [address]  C difficile quick scan w PCR reflex     Status: None   Collection Time: 11/26/17 12:39 AM  Result Value Ref Range Status   C Diff antigen NEGATIVE NEGATIVE Final   C Diff toxin NEGATIVE NEGATIVE Final   C Diff interpretation No C. difficile detected.  Final    Comment: Performed at [facility], [address] [place],  [address]    Coagulation Studies: Recent Labs    11/25/17 1406  LABPROT 15.2  INR 1.21    Imaging: Ct Head Wo Contrast  Result Date: 11/25/2017 CLINICAL DATA:  52 year old with bilateral lower extremity weakness and palpitations that began this morning. EXAM: CT HEAD WITHOUT CONTRAST TECHNIQUE: Contiguous axial images were obtained from the base of the skull through  the vertex without intravenous contrast. COMPARISON:  03/09/2016. FINDINGS: Brain: Ventricular system normal in size and appearance for age. No mass lesion. No midline shift. No acute hemorrhage or hematoma. No extra-axial fluid collections. No evidence of acute infarction. No focal brain parenchymal abnormalities.  Vascular: No hyperdense vessel.  No visible atherosclerosis. Skull: No skull fracture or other focal osseous abnormality involving the skull. Sinuses/Orbits: Visualized paranasal sinuses, bilateral mastoid air cells and bilateral middle ear cavities well-aerated. Visualized orbits and globes normal in appearance. Other: Remote fracture of the medial wall of the left orbit. IMPRESSION: Normal intracranially. Electronically Signed   By: Evangeline Dakin M.D.   On: 11/25/2017 12:16   Ct Angio Chest/abd/pel For Dissection W And/or Wo Contrast  Result Date: 11/25/2017 CLINICAL DATA:  Short of breath EXAM: CT ANGIOGRAPHY CHEST, ABDOMEN AND PELVIS TECHNIQUE: Multidetector CT imaging through the chest, abdomen and pelvis was performed using the standard protocol during bolus administration of intravenous contrast. Multiplanar reconstructed images and MIPs were obtained and reviewed to evaluate the vascular anatomy. CONTRAST:  19mL ISOVUE-370 IOPAMIDOL (ISOVUE-370) INJECTION 76% COMPARISON:  09/13/2017.  03/16/2005. FINDINGS: CTA CHEST FINDINGS Cardiovascular: No evidence of intramural hematoma. No evidence of aortic dissection or aneurysm. Great vessels patent. There is no obvious acute pulmonary thromboembolism. Right coronary artery calcifications are moderately prominent. Mediastinum/Nodes: No abnormal mediastinal adenopathy. No pericardial effusion. Normal thyroid gland. Lungs/Pleura: Low volumes and subsegmental atelectasis in the dependent lungs. Musculoskeletal: No vertebral compression deformity. No acute rib fracture. Review of the MIP images confirms the above findings. CTA ABDOMEN AND PELVIS  FINDINGS VASCULAR Aorta: Aorta is nonaneurysmal and patent. There is no evidence of aortic dissection. Celiac: Patent.  Accessory left hepatic artery anatomy. SMA: Patent. Renals: Single right renal artery is patent. Two left renal arteries are patent. IMA: Patent. Inflow: Common, internal, and external iliac arteries are patent. Review of the MIP images confirms the above findings. NON-VASCULAR Hepatobiliary: Diffuse hepatic steatosis. Gallbladder is within normal limits. Pancreas: Unremarkable Spleen: Unremarkable Adrenals/Urinary Tract: Left adrenal lipoma. Right adrenal gland and kidneys are within normal limits. Bladder is decompressed. Stomach/Bowel: Stomach is distended with enteric contents. Duodenum is within normal limits. There is no evidence of small-bowel obstruction. Normal appendix. No obvious mass in the colon. There is air and fluid throughout the colon. Lymphatic: No abnormal retroperitoneal adenopathy. Reproductive: Prostate is within normal limits. Other: No free fluid. Musculoskeletal: No vertebral compression. Review of the MIP images confirms the above findings. IMPRESSION: No evidence of aortic dissection or acute vascular process. The stomach is distended of unknown significance. There is no evidence of small-bowel obstruction. Electronically Signed   By: Marybelle Killings M.D.   On: 11/25/2017 12:32    Medications:  I have reviewed the patient's current medications. Scheduled: . atorvastatin  80 mg Oral q1800  . clopidogrel  75 mg Oral Q breakfast  . docusate sodium  100 mg Oral BID  . levothyroxine  125 mcg Oral QAC breakfast    Assessment/Plan: Patient back to baseline.  MRI of the brain reviewed and shows no acute changes.    No further neurologic intervention is recommended at this time.  If further questions arise, please call or page at that time.  Thank you for allowing neurology to participate in the care of this patient.    LOS: 2 days   Alexis Goodell,  MD Neurology [phone] 11/27/2017  11:12 AM

## 2017-11-29 NOTE — Discharge Summary (Signed)
[facility] [place] NAME: Erik Bush    MR#:  [id number]  DATE OF BIRTH:  08-28-1966  DATE OF ADMISSION:  11/25/2017   ADMITTING PHYSICIAN: Idelle Crouch, MD  DATE OF DISCHARGE: 11/27/2017  5:04 PM  PRIMARY CARE PHYSICIAN: Casilda Carls, MD   ADMISSION DIAGNOSIS:  Unstable angina ([place]) [I20.0] Chest pain [R07.9] DISCHARGE DIAGNOSIS:  Principal Problem:   Chest pain Active Problems:   CAD (coronary artery disease)   Abnormal EKG   Headache  SECONDARY DIAGNOSIS:   Past Medical History:  Diagnosis Date  . Asthma   . Coronary artery disease   . Hypothermia   . Hypothyroidism   . Pneumonia 2016  . Sleep apnea    USES CPAP  . Wears contact lenses    HOSPITAL COURSE:  52 y.o. male with history of CAD s/p recent PCI/DES to the RCA as above, HLD, asthma, hypothyroidism, and OSA on CPAP admitted with chest pain and was found to have norovirus.   1. Atypical chest pain/CAD of native coronary artery as above: -Currently chest pain free -Has ruled out -Echo unchanged -Pain is worse with deep inspiration and coughing - pleuritic -Continue DAPT -Aggressive risk factor modification and secondary prevention   2. Hedache/tremor: -improved -MRI brain neg  3. Norovirus: -Supportive care  4. HLD: -Lipitor -Needs follow up lipid and liver in the office  5. Cough: -Likely contributing to his chest pain DISCHARGE CONDITIONS:  stable CONSULTS OBTAINED:  Treatment Team:  Corey Skains, MD Alexis Goodell, MD DRUG ALLERGIES:   Allergies  Allergen Reactions  . Aspirin Itching and Swelling  . Brussels Sprouts [Brassica Oleracea Italica] Swelling  . Other Swelling    OLIVES-SWELLING  . Morphine And Related     Had tingling fingers,chest pain,tightness after dose.   DISCHARGE MEDICATIONS:   Allergies as of 11/27/2017      Reactions   Aspirin Itching, Swelling   Brussels Sprouts [brassica Oleracea  Italica] Swelling   Other Swelling   OLIVES-SWELLING   Morphine And Related    Had tingling fingers,chest pain,tightness after dose.      Medication List    TAKE these medications   atorvastatin 80 MG tablet Commonly known as:  LIPITOR Take 1 tablet (80 mg total) by mouth daily at 6 PM.   clopidogrel 75 MG tablet Commonly known as:  PLAVIX Take 1 tablet (75 mg total) by mouth daily with breakfast.   levothyroxine 125 MCG tablet Commonly known as:  SYNTHROID, LEVOTHROID Take 125 mcg by mouth daily before breakfast.   metoprolol succinate 25 MG 24 hr tablet Commonly known as:  TOPROL-XL Take 1 tablet (25 mg total) by mouth daily.   multivitamin with minerals tablet Take 1 tablet by mouth daily.   Vitamin D (Ergocalciferol) 50000 units Caps capsule Commonly known as:  DRISDOL Take 1 capsule by mouth every 7 (seven) days.        DISCHARGE INSTRUCTIONS:   DIET:  Regular diet DISCHARGE CONDITION:  Good ACTIVITY:  Activity as tolerated OXYGEN:  Home Oxygen: No.  Oxygen Delivery: room air DISCHARGE LOCATION:  home   If you experience worsening of your admission symptoms, develop shortness of breath, life threatening emergency, suicidal or homicidal thoughts you must seek medical attention immediately by calling 911 or calling your MD immediately  if symptoms less severe.  You Must read complete instructions/literature along with all the possible adverse reactions/side effects for all the Medicines you take and  that have been prescribed to you. Take any new Medicines after you have completely understood and accpet all the possible adverse reactions/side effects.   Please note  You were cared for by a hospitalist during your hospital stay. If you have any questions about your discharge medications or the care you received while you were in the hospital after you are discharged, you can call the unit and asked to speak with the hospitalist on call if the hospitalist that  took care of you is not available. Once you are discharged, your primary care physician will handle any further medical issues. Please note that NO REFILLS for any discharge medications will be authorized once you are discharged, as it is imperative that you return to your primary care physician (or establish a relationship with a primary care physician if you do not have one) for your aftercare needs so that they can reassess your need for medications and monitor your lab values.    On the day of Discharge:  VITAL SIGNS:  Blood pressure 103/65, pulse 65, temperature 98.5 F (36.9 C), temperature source Oral, resp. rate 18, height 5\' 8"  (1.727 m), weight 95.2 kg (209 lb 14.4 oz), SpO2 96 %. PHYSICAL EXAMINATION:  GENERAL:  52 y.o.-year-old patient lying in the bed with no acute distress.  EYES: Pupils equal, round, reactive to light and accommodation. No scleral icterus. Extraocular muscles intact.  HEENT: Head atraumatic, normocephalic. Oropharynx and nasopharynx clear.  NECK:  Supple, no jugular venous distention. No thyroid enlargement, no tenderness.  LUNGS: Normal breath sounds bilaterally, no wheezing, rales,rhonchi or crepitation. No use of accessory muscles of respiration.  CARDIOVASCULAR: S1, S2 normal. No murmurs, rubs, or gallops.  ABDOMEN: Soft, non-tender, non-distended. Bowel sounds present. No organomegaly or mass.  EXTREMITIES: No pedal edema, cyanosis, or clubbing.  NEUROLOGIC: Cranial nerves II through XII are intact. Muscle strength 5/5 in all extremities. Sensation intact. Gait not checked.  PSYCHIATRIC: The patient is alert and oriented x 3.  SKIN: No obvious rash, lesion, or ulcer.  DATA REVIEW:   CBC Recent Labs  Lab 11/27/17 0540  WBC 4.7  HGB 13.8  HCT 41.0  PLT 162    Chemistries  Recent Labs  Lab 11/26/17 0255 11/27/17 0920  NA 137 134*  K 3.5 3.8  CL 109 106  CO2 21* 22  GLUCOSE 116* 110*  BUN 19 8  CREATININE 0.79 0.67  CALCIUM 8.4* 8.4*  MG   --  1.9  AST 25  --   ALT 42  --   ALKPHOS 77  --   BILITOT 1.6*  --      Follow-up Information    Casilda Carls, MD. Schedule an appointment as soon as possible for a visit in 1 week.   Specialty:  Internal Medicine Why:  THE OFFICE WILL CALL YOU WITH AN APPOINTMENT. Thanks! Contact information: [place] [address] [phone]           Management plans discussed with the patient, family and they are in agreement.  CODE STATUS: Prior   TOTAL TIME TAKING CARE OF THIS PATIENT: 45 minutes.    Max Sane M.D on 11/29/2017 at 5:48 PM  Between 7am to 6pm - Pager - [phone]  After 6pm go to [email or web address] - Proofreader  [facility]  Office  [phone]  CC: Primary care physician; Casilda Carls, MD   Note: This dictation was prepared with Dragon dictation along with smaller phrase technology. Any transcriptional errors  that result from this process are unintentional.

## 2018-01-14 DIAGNOSIS — G4733 Obstructive sleep apnea (adult) (pediatric): Secondary | ICD-10-CM | POA: Insufficient documentation

## 2018-01-14 DIAGNOSIS — E785 Hyperlipidemia, unspecified: Secondary | ICD-10-CM | POA: Insufficient documentation

## 2018-01-14 NOTE — Progress Notes (Signed)
Cardiology Office Note  Date:  01/15/2018   ID:  Olvin Rohr, DOB 26-Aug-1966, MRN [id number]  PCP:  Casilda Carls, MD   Chief Complaint  Patient presents with  . OTHER    F/u ED sob, palpitations , right arm numbness/pain and chest pain. Meds reviewed verbally with pt.    HPI:  Gary Gabrielsen is a 52 y.o. male with a hx of  CAD Smoker 2 packs/day occluded proximal RCA,  stent x2 placed to the RCA in January 2019,  Hospitalization for symptoms March 2019 diagnosed with norovirus, ruled out Who presents to establish care in the [place] office in for follow-up of his coronary disease  Hospitalized November 25 2017 with shortness of breath, weakness, palpitations,  "legs giving out", appeared pale and anxious, reporting that he felt similar to when he had his heart attack.   Weakness, diarrhea  CT scan performed, stomach distended otherwise no aortic dissection or acute vascular process  Patient was complaining of head tremors Nitro drip held for hypotension Neurology was consult and by admitting physician for headache and head bobbing  Patient also reported having right lower extremity numbness History of sleep apnea and uses home CPAP per the notes  Seen by cardiology March 16 in the afternoon Reported to have central and left chest rib pain, severe tenderness on palpation especially when taking a deep breath Chest pain worse after coughing,  Initially seen by [facility] was consulted  for second opinion  Diarrhea tested positive for noro virus  Cardiac enzymes negative 3, EKG with nonspecific ST T wave abnormality also noted precordial leads -echocardiogram looks grossly unchanged  Since his discharge he reports his diarrhea has resolved Numerous questions today Numbness down his on the distribution right arm Numbness and tingling right anterior thigh Questions concerning erectile dysfunction medication Coughing in the morning wonders if it  is one of his medications Periodic dizziness, orthostasis Would like refills on his medications  EKG personally reviewed by myself on todays visit  shows normal sinus rhythm with rate 76 bpm no significant ST or T-wave changes  Lab work reviewed through primary care creatinine 0.86  total cholesterol 104  LDL 49  hematocrit 47 essentially normal LFTs normal TSH  Family hx: Brother with congenital heart issue Denies any cardiac issues mother and father    PMH:   has a past medical history of Asthma, Coronary artery disease, Hyperlipidemia, Hypothermia, Hypothyroidism, Pneumonia (2016), Sleep apnea, and Wears contact lenses.  PSH:    Past Surgical History:  Procedure Laterality Date  . ANAL FISTULOTOMY N/A 06/08/2015   Procedure: ANAL FISTULOTOMY;  Surgeon: Christene Lye, MD;  Location: [facility] ORS;  Service: General;  Laterality: N/A;  . ANTERIOR CRUCIATE LIGAMENT REPAIR     knees  . CARDIAC CATHETERIZATION    . CLOSED REDUCTION NASAL FRACTURE N/A 03/22/2016   Procedure: CLOSED REDUCTION NASAL FRACTURE;  Surgeon: Clyde Canterbury, MD;  Location: [place];  Service: ENT;  Laterality: N/A;  sleep apnea  . LEFT HEART CATH AND CORONARY ANGIOGRAPHY N/A 09/14/2017   Procedure: LEFT HEART CATH AND CORONARY ANGIOGRAPHY and possible PCI;  Surgeon: Isaias Cowman, MD;  Location: [place] [facility];  Service: Cardiovascular;  Laterality: N/A;  . LIPOMA EXCISION N/A 04/05/2017   Procedure: EXCISION LIPOMA Nuchal area.  prone position;  Surgeon: Jules Husbands, MD;  Location: [facility];  Service: General;  Laterality: N/A;  Prone  . MEDIAL COLLATERAL LIGAMENT REPAIR, KNEE    .  SHOULDER ARTHROSCOPY Right     Current Outpatient Medications  Medication Sig Dispense Refill  . atorvastatin (LIPITOR) 80 MG tablet Take 1 tablet (80 mg total) by mouth daily at 6 PM. 90 tablet 3  . clopidogrel (PLAVIX) 75 MG tablet Take 1 tablet (75 mg total) by mouth daily with breakfast. 90 tablet 3   . levothyroxine (SYNTHROID, LEVOTHROID) 125 MCG tablet Take 125 mcg by mouth daily before breakfast.    . metoprolol succinate (TOPROL-XL) 25 MG 24 hr tablet Take 1 tablet (25 mg total) by mouth daily. 90 tablet 3  . Multiple Vitamins-Minerals (MULTIVITAMIN WITH MINERALS) tablet Take 1 tablet by mouth daily.    . Vitamin D, Ergocalciferol, (DRISDOL) 50000 units CAPS capsule Take 1 capsule by mouth every 7 (seven) days.   0  . sildenafil (REVATIO) 20 MG tablet Take 1 tablet (20 mg total) by mouth 3 (three) times daily as needed. 90 tablet 6   No current facility-administered medications for this visit.      Allergies:   Aspirin; Brussels sprouts [brassica oleracea italica]; Other; and Morphine and related   Social History:  The patient  reports that he quit smoking about 27 years ago. His smoking use included cigarettes. He has a 12.00 pack-year smoking history. He has never used smokeless tobacco. He reports that he drinks alcohol. He reports that he does not use drugs.   Family History:   family history includes Heart Problems in his brother; Heart attack in his unknown relative; Thyroid disease in his father.    Review of Systems: Review of Systems  Constitutional: Negative.   Respiratory: Negative.   Cardiovascular: Negative.   Gastrointestinal: Negative.   Musculoskeletal: Negative.   Neurological: Positive for dizziness.       Numbness and tingling right ulnar distribution, right anterior femoral cutaneous region  Psychiatric/Behavioral: Negative.   All other systems reviewed and are negative.    PHYSICAL EXAM: VS:  BP 112/77 (BP Location: Right Arm, Patient Position: Sitting, Cuff Size: Large)   Pulse 76   Ht 5\' 8"  (1.727 m)   Wt 210 lb 12 oz (95.6 kg)   BMI 32.04 kg/m  , BMI Body mass index is 32.04 kg/m. GEN: Well nourished, well developed, in no acute distress  HEENT: normal  Neck: no JVD, carotid bruits, or masses Cardiac: RRR; no murmurs, rubs, or gallops,no  edema  Respiratory:  clear to auscultation bilaterally, normal work of breathing GI: soft, nontender, nondistended, + BS MS: no deformity or atrophy  Skin: warm and dry, no rash Neuro:  Strength and sensation are intact Psych: euthymic mood, full affect    Recent Labs: 11/25/2017: TSH 0.370 11/26/2017: ALT 42 11/27/2017: BUN 8; Creatinine, Ser 0.67; Hemoglobin 13.8; Magnesium 1.9; Platelets 162; Potassium 3.8; Sodium 134    Lipid Panel Lab Results  Component Value Date   CHOL 188 09/14/2017   HDL 36 (L) 09/14/2017   LDLCALC 107 (H) 09/14/2017   TRIG 225 (H) 09/14/2017      Wt Readings from Last 3 Encounters:  01/15/18 210 lb 12 oz (95.6 kg)  11/27/17 209 lb 14.4 oz (95.2 kg)  11/15/17 212 lb (96.2 kg)       ASSESSMENT AND PLAN:  Coronary artery disease of native artery of native heart with stable angina pectoris ([place]) - Plan: EKG 12-Lead Previous cardiac catheterization results discussed with him, stent placement to RCA Maintained on Plavix, reports allergy to aspirin Recent lipid panel at goal  Chest pain, unspecified type -  Plan: EKG 12-Lead Recent hospitalization, atypical chest pain No further workup needed Had noro virus  Mixed hyperlipidemia Cholesterol is at goal on the current lipid regimen. No changes to the medications were made.  OSA (obstructive sleep apnea) Managed by primary care  Essential hypertension Blood pressure running low Recommended he stay hydrated and if he has dizziness or lightheadedness despite hydration we would decrease her metoprolol in half  Ulnar nerve entrapment at elbow, right Etiology unclear Details discussed with him He'll monitor symptoms for now  Thigh numbness Suspect the superficial cutaneous femoral nerve entrapment  Recommended weight loss  Erectile dysfunction, unspecified erectile dysfunction type Prescription provided for generic Viagra Details discussed with him concerning dosing  Disposition:   F/U  12  months   Total encounter time more than 45 minutes  Greater than 50% was spent in counseling and coordination of care with the patient    Orders Placed This Encounter  Procedures  . EKG 12-Lead     Signed, Esmond Plants, M.D., Ph.D. 01/15/2018  [facility] [place], [place] [phone]

## 2018-01-15 ENCOUNTER — Ambulatory Visit (INDEPENDENT_AMBULATORY_CARE_PROVIDER_SITE_OTHER): Payer: BLUE CROSS/BLUE SHIELD | Admitting: Cardiovascular Disease

## 2018-01-15 ENCOUNTER — Encounter

## 2018-01-15 ENCOUNTER — Encounter: Payer: Self-pay | Admitting: Cardiovascular Disease

## 2018-01-15 VITALS — BP 112/77 | HR 76 | Ht 68.0 in | Wt 210.8 lb

## 2018-01-15 DIAGNOSIS — R079 Chest pain, unspecified: Secondary | ICD-10-CM | POA: Diagnosis not present

## 2018-01-15 DIAGNOSIS — N529 Male erectile dysfunction, unspecified: Secondary | ICD-10-CM

## 2018-01-15 DIAGNOSIS — G4733 Obstructive sleep apnea (adult) (pediatric): Secondary | ICD-10-CM

## 2018-01-15 DIAGNOSIS — E782 Mixed hyperlipidemia: Secondary | ICD-10-CM

## 2018-01-15 DIAGNOSIS — I25118 Atherosclerotic heart disease of native coronary artery with other forms of angina pectoris: Secondary | ICD-10-CM | POA: Diagnosis not present

## 2018-01-15 DIAGNOSIS — I10 Essential (primary) hypertension: Secondary | ICD-10-CM

## 2018-01-15 DIAGNOSIS — G5621 Lesion of ulnar nerve, right upper limb: Secondary | ICD-10-CM | POA: Diagnosis not present

## 2018-01-15 DIAGNOSIS — R200 Anesthesia of skin: Secondary | ICD-10-CM | POA: Diagnosis not present

## 2018-01-15 MED ORDER — CLOPIDOGREL BISULFATE 75 MG PO TABS: 75.0000 mg | ORAL_TABLET | Freq: Every day | ORAL | 3 refills | Status: AC

## 2018-01-15 MED ORDER — SILDENAFIL CITRATE 20 MG PO TABS: 20.0000 mg | ORAL_TABLET | Freq: Three times a day (TID) | ORAL | 6 refills | Status: AC | PRN

## 2018-01-15 MED ORDER — METOPROLOL SUCCINATE ER 25 MG PO TB24: 25.0000 mg | ORAL_TABLET | Freq: Every day | ORAL | 3 refills | Status: AC

## 2018-01-15 MED ORDER — ATORVASTATIN CALCIUM 80 MG PO TABS: 80.0000 mg | ORAL_TABLET | Freq: Every day | ORAL | 3 refills | Status: DC

## 2018-01-15 NOTE — Patient Instructions (Addendum)
We will refill your medications, 90 days   Medication Instructions:   If you get dizzy, Hydrate, If symptoms persiste, cut the metoprolol in 1/2 daily (night)  Consider omeprazole 20 mg  for acid, cough, trial, one a day  revatio take 3 to 5 pills as needed (60 to 100 mg at a time)  Labwork:  No new labs needed  Testing/Procedures:  No further testing at this time   Follow-Up: It was a pleasure seeing you in the office today. Please call us if you have new issues that need to be addressed before your next appt.  [phone]  Your physician wants you to follow-up in: 12 months.  You will receive a reminder letter in the mail two months in advance. If you don't receive a letter, please call our office to schedule the follow-up appointment.  If you need a refill on your cardiac medications before your next appointment, please call your pharmacy.  For educational health videos Log in to : [email or web address] Or : [email or web address], password : [email or web address]

## 2018-06-21 IMAGING — US US ART/VEN ABD/PELV/SCROTUM DOPPLER LTD
1 series · 13 of 25 positions shown · non-contrast
Comparison: None

CLINICAL DATA: RIGHT testicular pain for 3 days, RIGHT testicle
feels higher than normal

EXAM:
SCROTAL ULTRASOUND
DOPPLER ULTRASOUND OF THE TESTICLES
TECHNIQUE: Complete ultrasound examination of the testicles, epididymis, and
other scrotal structures was performed. Color and spectral Doppler
ultrasound were also utilized to evaluate blood flow to the
testicles.

[Series 1: us art/ven abd/pelv/scrotum doppler ltd · 0.08mm/px · 13 of 60 slices shown]
[im 1/60]
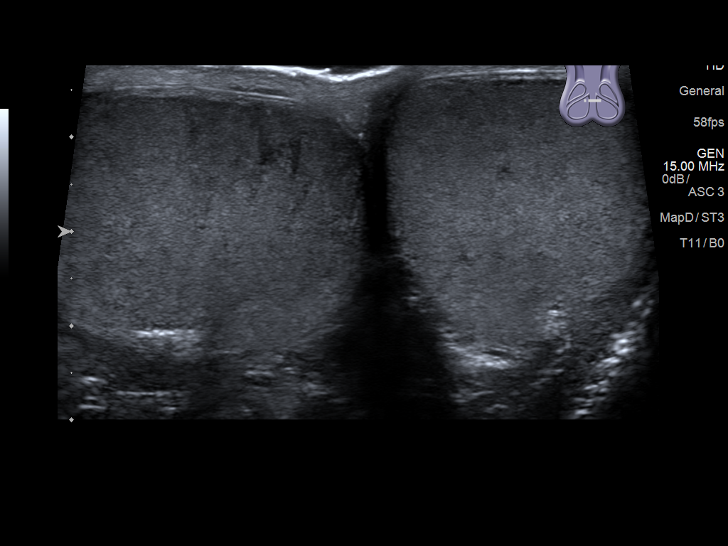
[im 5/60]
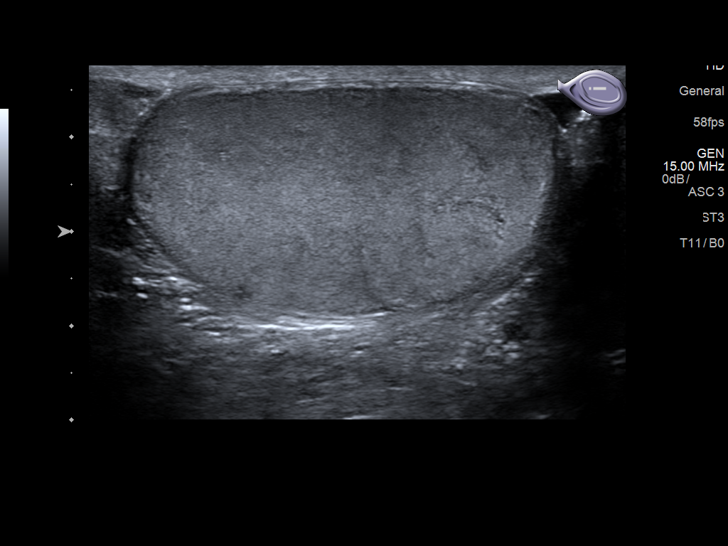
[im 10/60]
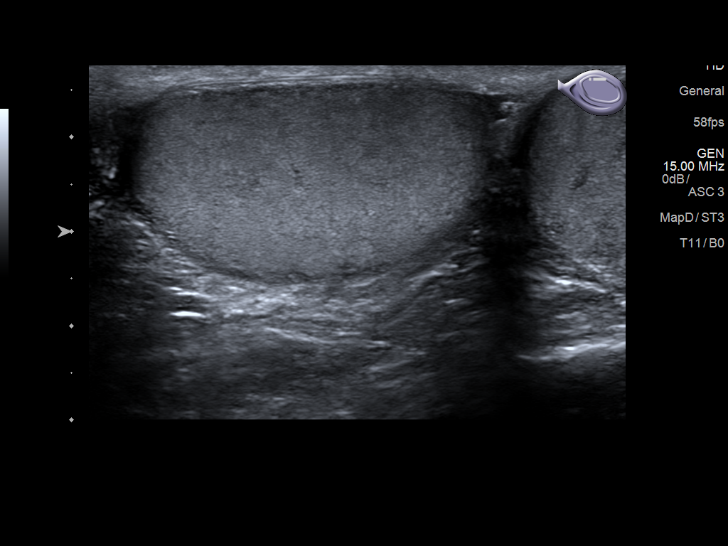
[im 15/60]
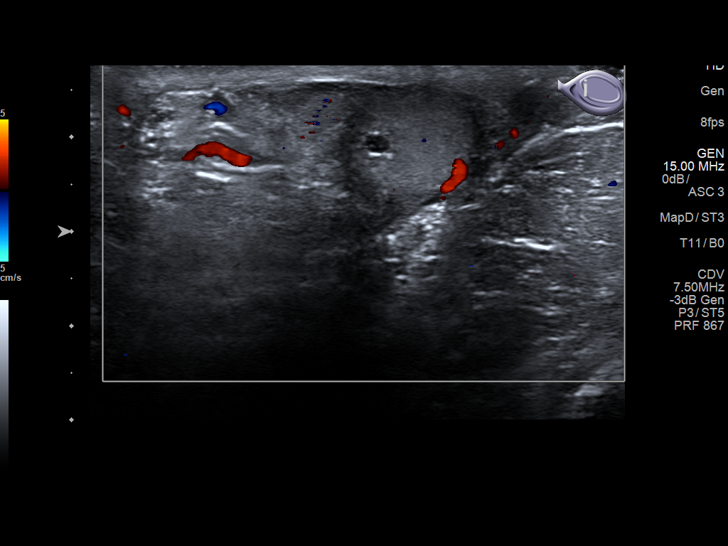
[im 20/60]
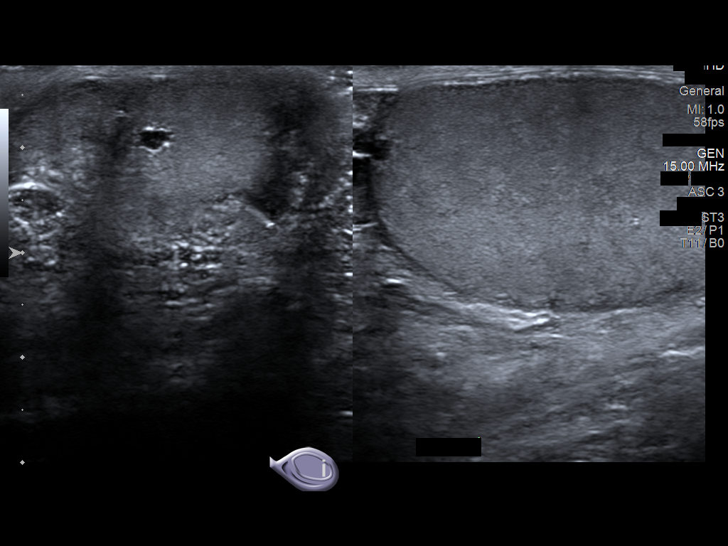
[im 25/60]
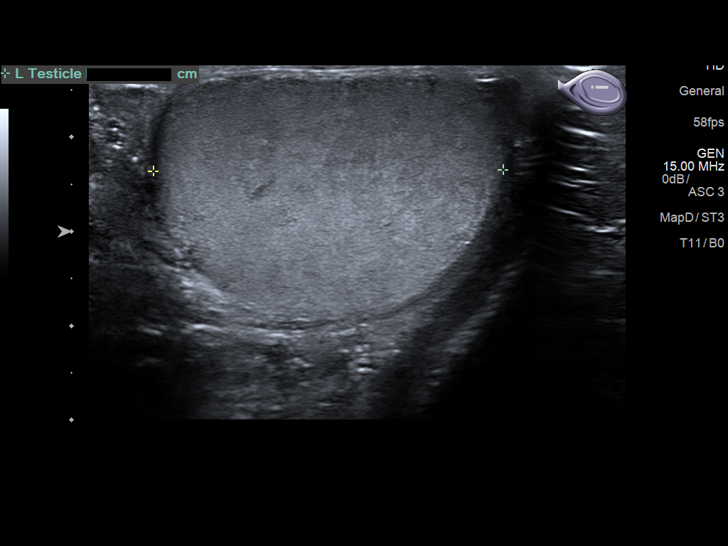
[im 30/60]
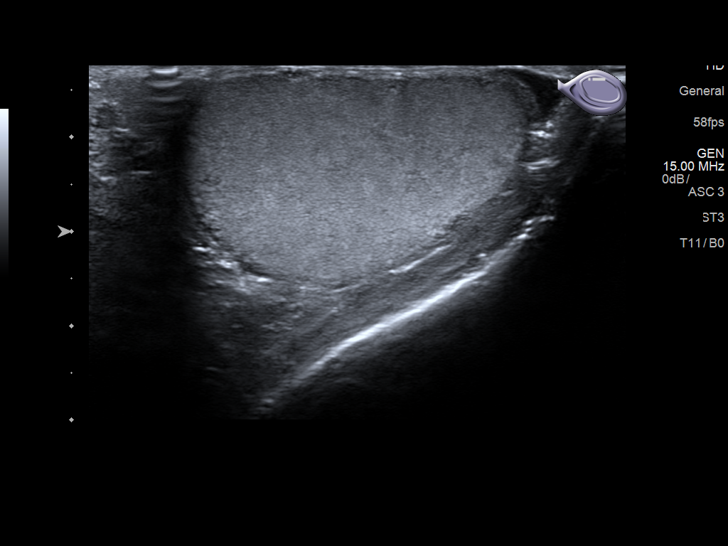
[im 35/60]
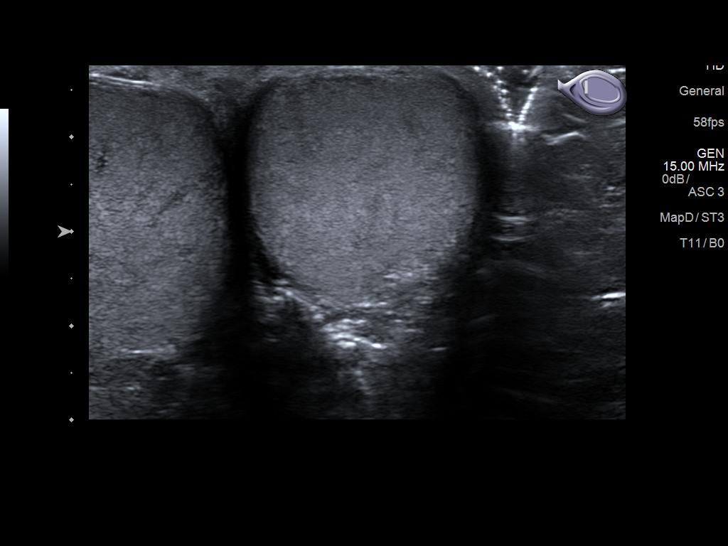
[im 40/60]
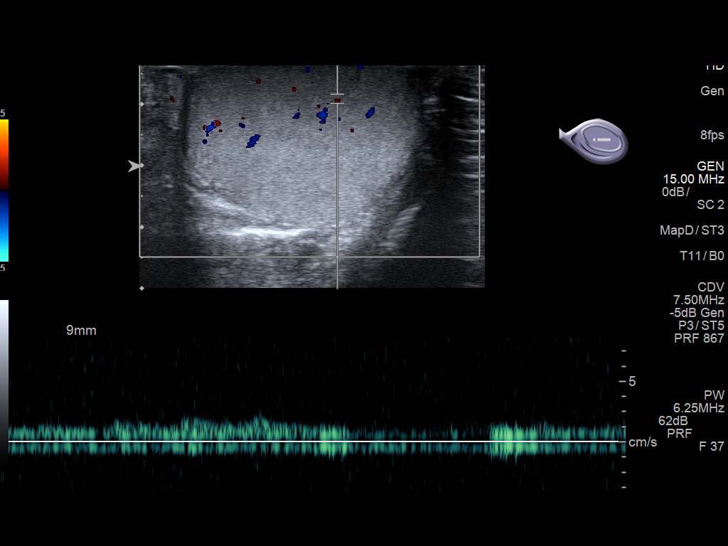
[im 45/60]
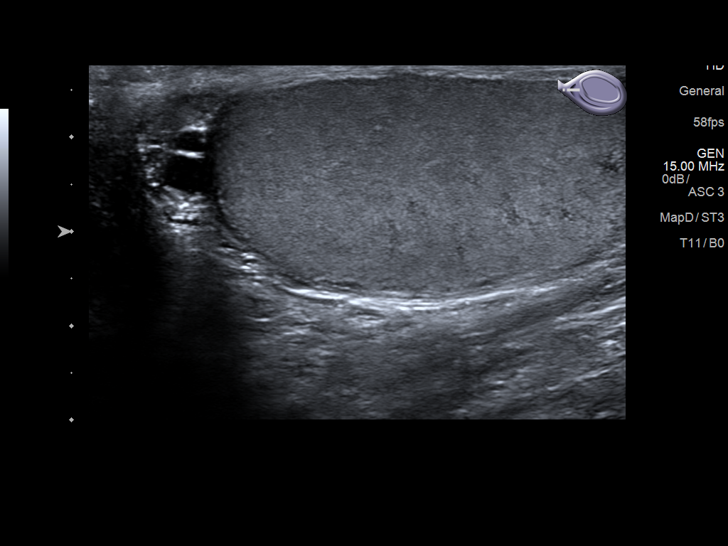
[im 50/60]
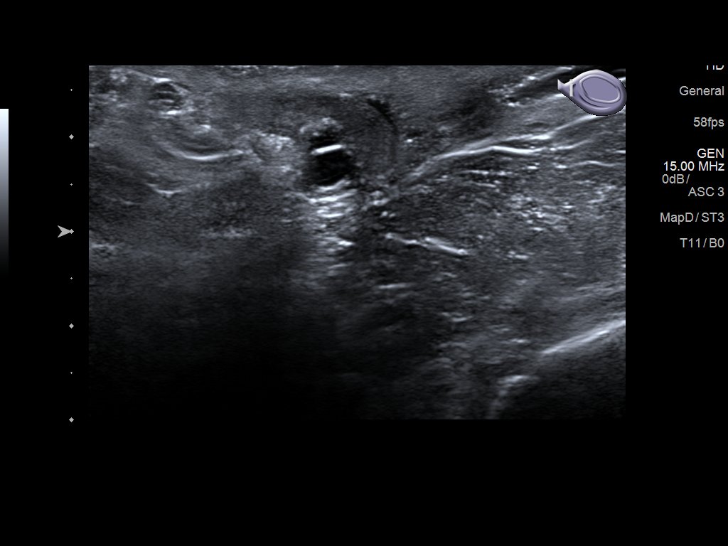
[im 55/60]
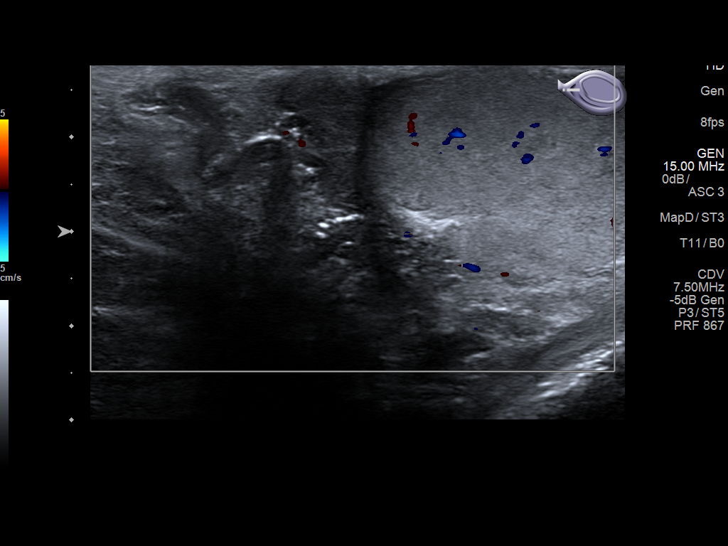
[im 60/60]
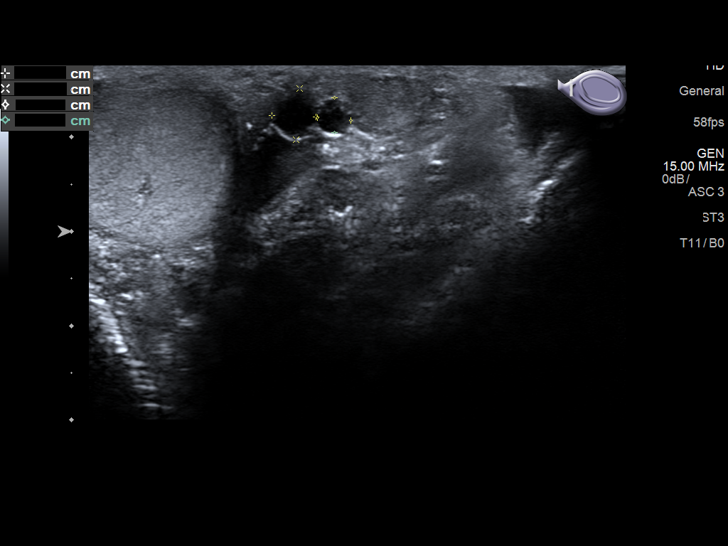

[13 of 25 positions shown; findings below may reference images not displayed]

FINDINGS: Right testicle

Measurements: 4.6 x 2.8 x 3.4 cm. Small cyst within testis 3 x 2 x 2
mm. No additional mass lesions or calcifications. Internal blood
flow present on color Doppler imaging.

Left testicle

Measurements: 3.7 x 2.6 x 2.9 cm. Normal morphology without mass or
calcification. Internal blood flow present on color Doppler imaging,
fairly symmetric with RIGHT new.

Right epididymis: Small cysts at epididymal head largest 5 mm
diameter

Left epididymis: Small cysts at epididymal head largest 5 mm
diameter.

Hydrocele:  Absent bilaterally

Varicocele:  Absent bilaterally

Pulsed Doppler interrogation of both testes demonstrates normal low
resistance arterial and venous waveforms bilaterally.
IMPRESSION: Small BILATERAL epididymal cysts with additional small cyst in the
RIGHT testis.

Otherwise negative exam

## 2019-01-03 IMAGING — CT CT ANGIO CHEST
4 of 7 series · 19 of 46 positions shown · IV contrast (APPLIED)
Comparison: Chest radiograph earlier this day.

CLINICAL DATA: Chest pain, cardiac etiology is suspected. Left arm
numbness and tingling. Shortness of breath. Dizziness.

EXAM:
CT ANGIOGRAPHY CHEST WITH CONTRAST
TECHNIQUE: Multidetector CT imaging of the chest was performed using the
standard protocol during bolus administration of intravenous
contrast. Multiplanar CT image reconstructions and MIPs were
obtained to evaluate the vascular anatomy.
CONTRAST:  100mL T7PQ5Z-VGC IOPAMIDOL (T7PQ5Z-VGC) INJECTION 76%

[Series 3: axial pre · axial · non-contrast · 0.73mm/px · z∈[-656,-456]mm · 5 of 60 slices shown]
[im 10/60  lung]
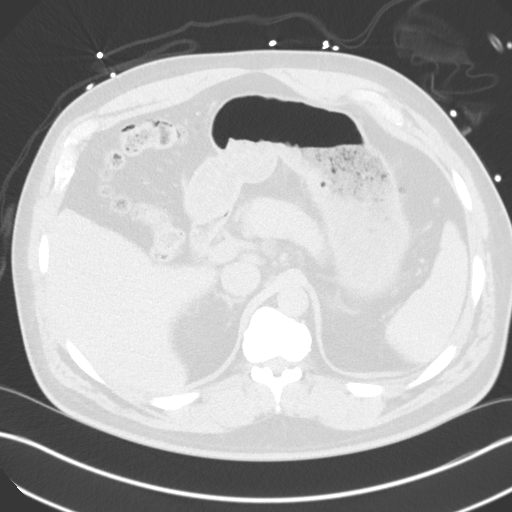
[im 20/60  lung]
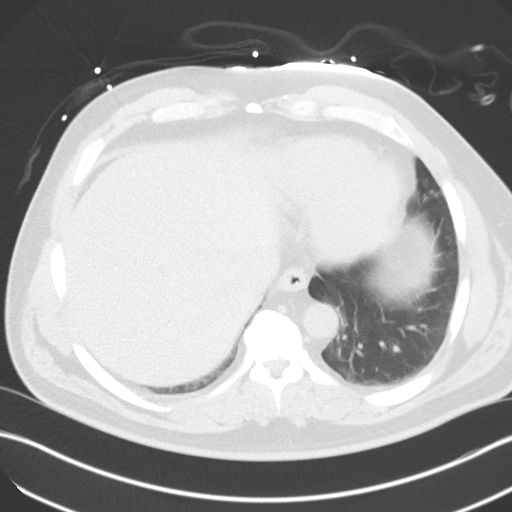
[im 30/60  lung]
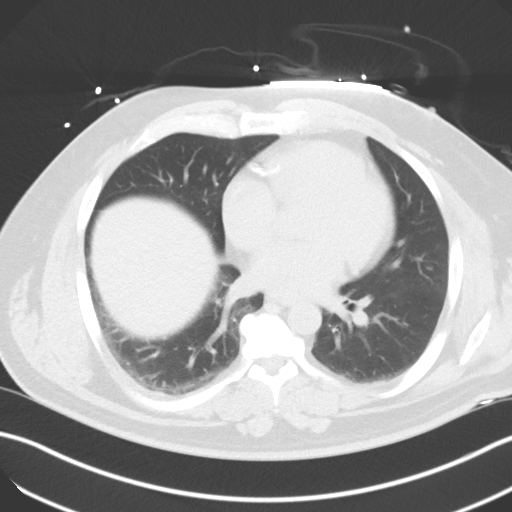
[im 40/60  lung]
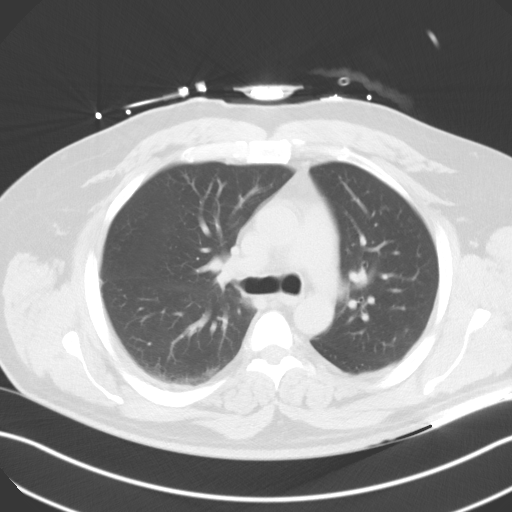
[im 50/60  lung]
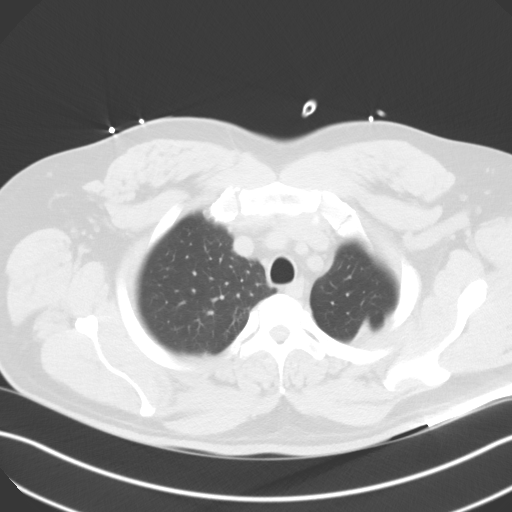

[Series 7: axial arterial · axial · arterial · 0.73mm/px · z∈[-670,-438]mm · 8 of 99 slices shown]
[im 11/99  lung]
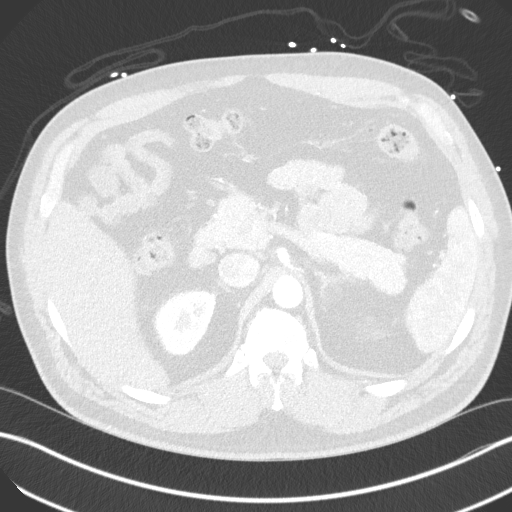
[im 22/99  soft-tissue]
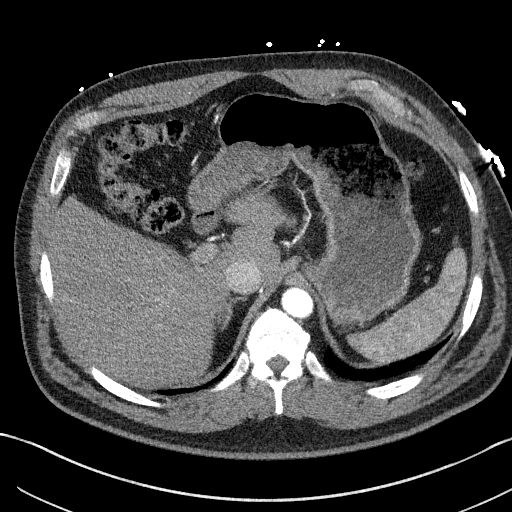
[im 33/99  lung]
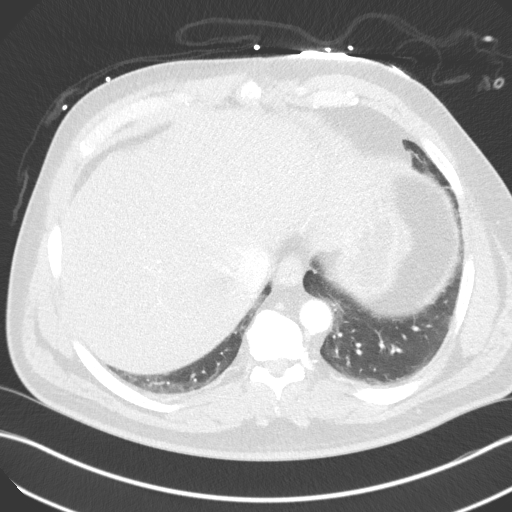
[im 44/99  soft-tissue]
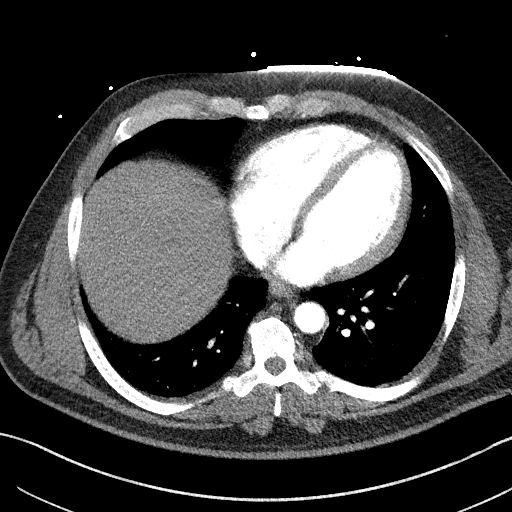
[im 55/99  lung]
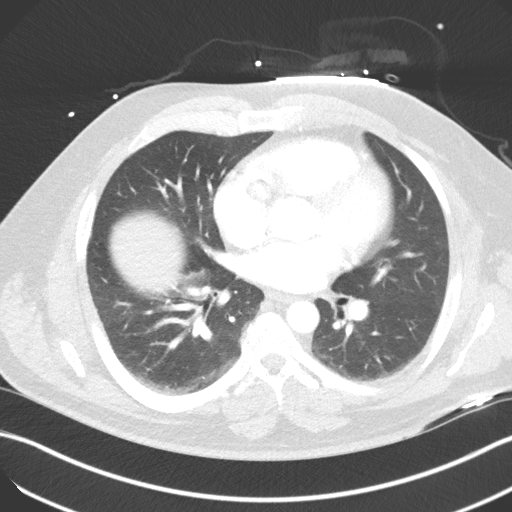
[im 66/99  soft-tissue]
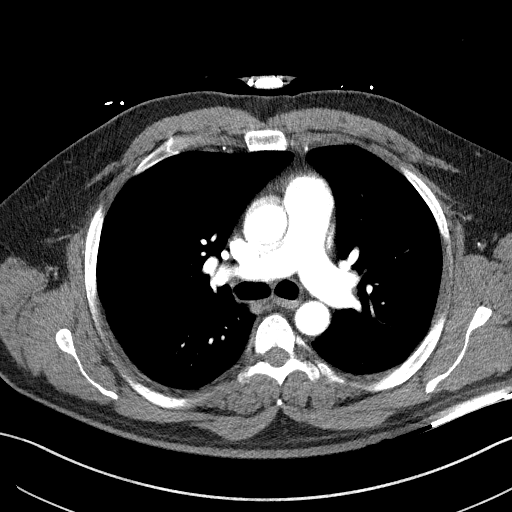
[im 77/99  lung]
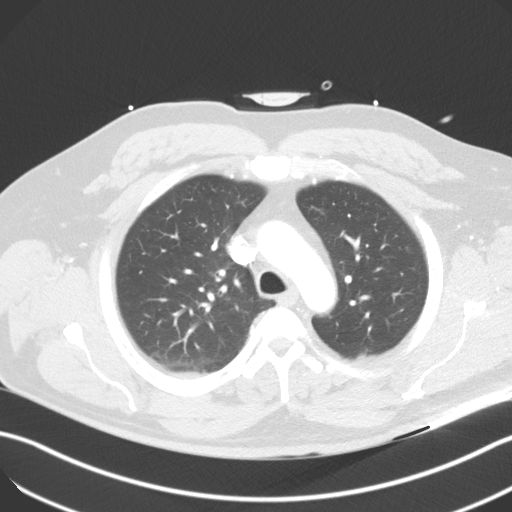
[im 88/99  soft-tissue]
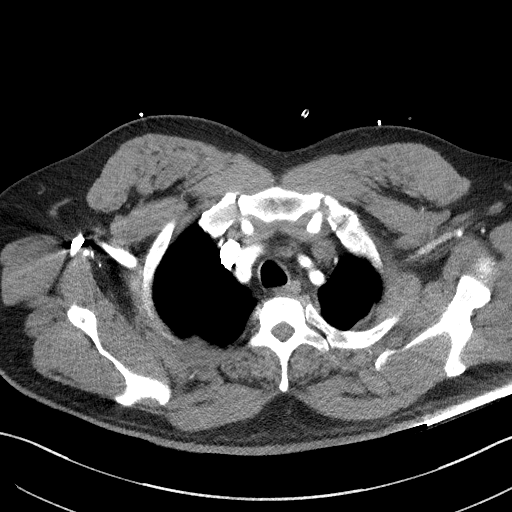

[Series 8: lung · axial · 0.73mm/px · z∈[-680,-596]mm · 3 of 148 slices shown]
[im 11/148  soft-tissue]
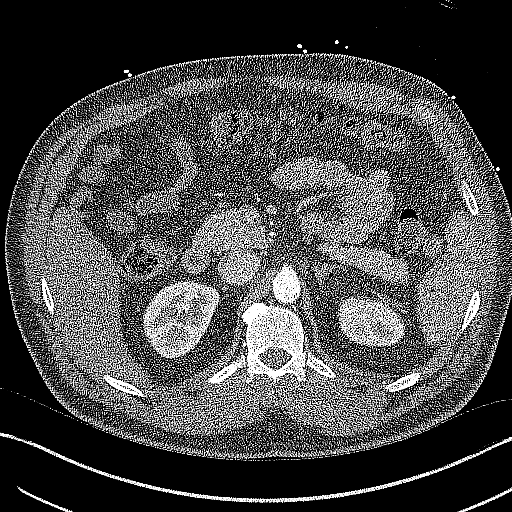
[im 32/148  soft-tissue]
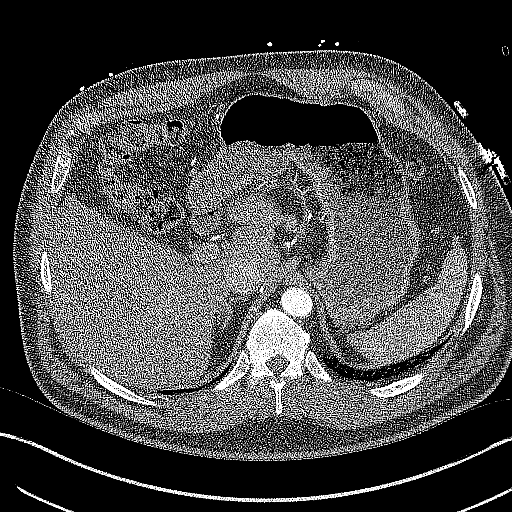
[im 53/148  soft-tissue]
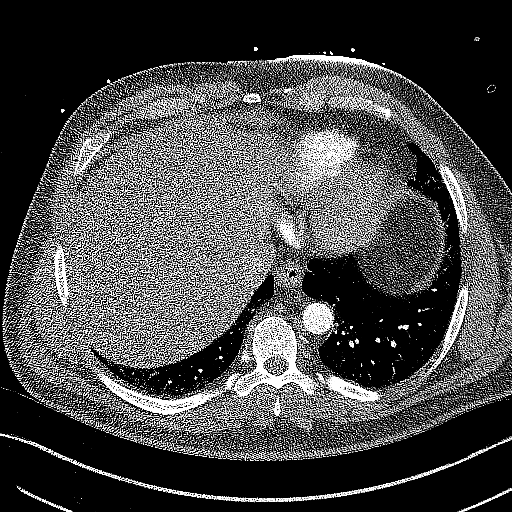

[Series 9: coronals · coronal · 0.68mm/px · 3 of 132 slices shown]
[im 33/132  soft-tissue]
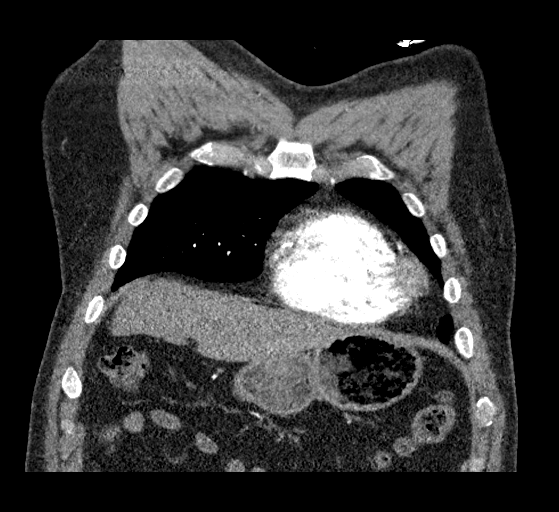
[im 66/132  soft-tissue]
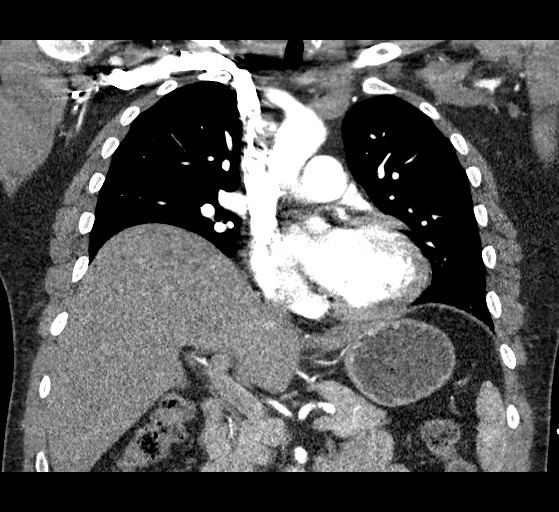
[im 99/132  soft-tissue]
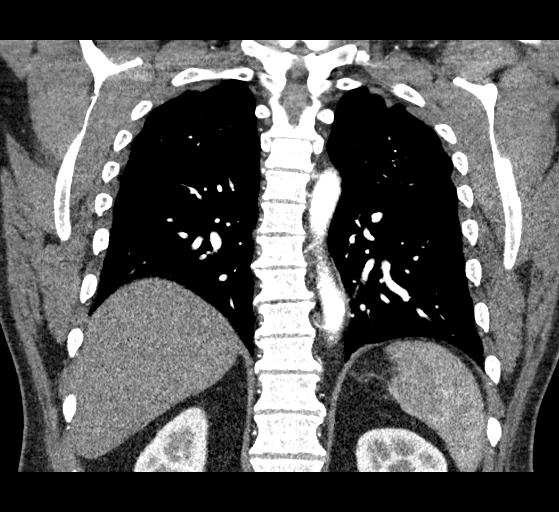

[19 of 46 positions shown; findings below may reference images not displayed]

FINDINGS: Cardiovascular: Thoracic aorta is normal in caliber. No dissection,
aortic hematoma or aneurysm. No significant aortic atherosclerosis.
Conventional branching pattern from the aortic arch. No filling
defects in the central pulmonary arteries to suggest pulmonary
embolus. There are coronary artery calcifications. No pericardial
fluid.

Mediastinum/Nodes: No enlarged mediastinal or hilar lymph nodes. The
esophagus is decompressed, tiny hiatal hernia. Visualized thyroid
gland is normal.

Lungs/Pleura: Mild central bronchial thickening. Lower lobe
atelectasis, right greater than left. No confluent consolidation. No
pulmonary edema. No pleural fluid.

Upper Abdomen: 15 mm fat density left adrenal nodule consistent with
myelolipoma, similar to prior. No acute abnormality.

Musculoskeletal: Again seen bone island in the midthoracic vertebra.
There are no acute or suspicious osseous abnormalities.

Review of the MIP images confirms the above findings.
IMPRESSION: 1. Mild central bronchial thickening.
2. No aortic dissection or acute aortic syndrome.
3. Coronary artery calcifications.

## 2019-01-03 IMAGING — DX DG CHEST 1V
1 series · 1 of 1 positions shown · non-contrast
Comparison: July 22, 2017

CLINICAL DATA: Shortness of breath and chest pain

EXAM:
CHEST 1 VIEW

[chest ap]
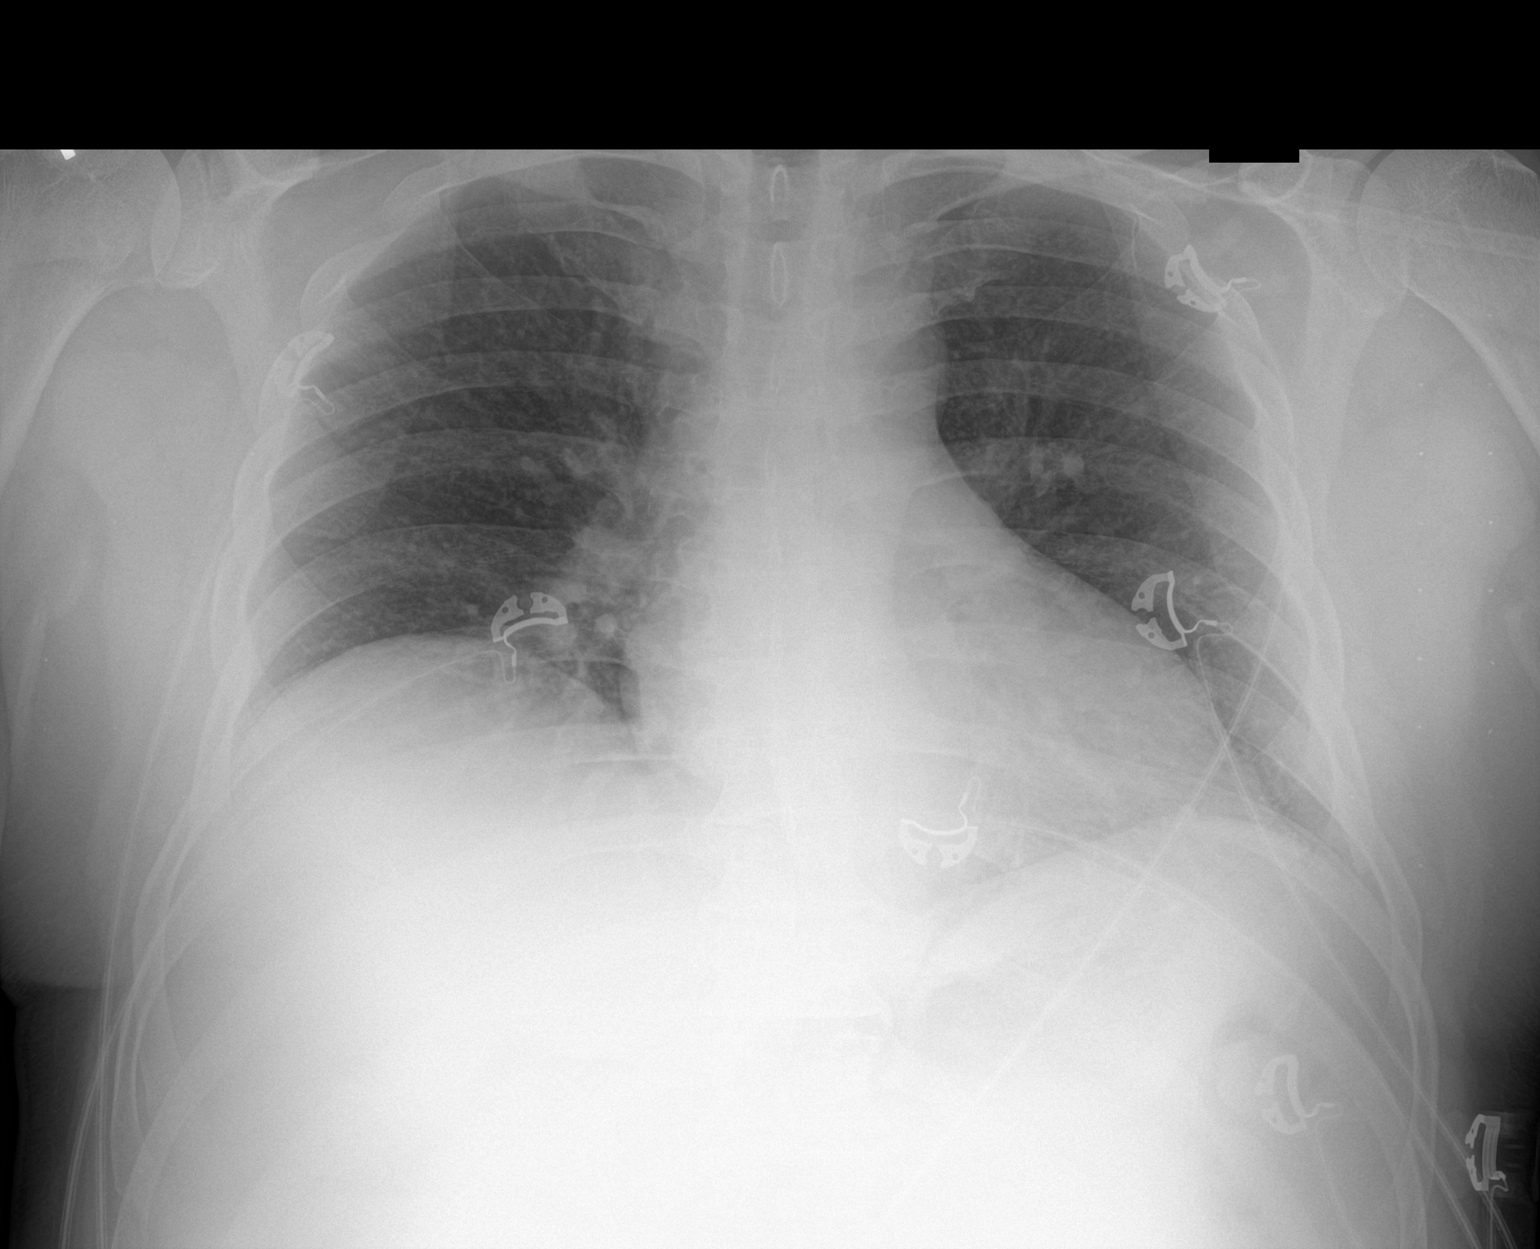

[1 of 1 positions shown; findings below may reference images not displayed]

FINDINGS: There is no edema or consolidation. Heart is upper normal in size
with pulmonary vascularity within normal limits. No pneumothorax. No
adenopathy. No bone lesions.
IMPRESSION: No edema or consolidation.

## 2019-02-21 ENCOUNTER — Telehealth: Payer: Self-pay

## 2019-02-21 NOTE — Telephone Encounter (Signed)
Virtual Visit Pre-Appointment Phone Call  "Erik Bush, I am calling you today to discuss your upcoming appointment. We are currently trying to limit exposure to the virus that causes COVID-19 by seeing patients at home rather than in the office."  1. "What is the BEST phone number to call the day of the visit?" - include this in appointment notes  2. "Do you have or have access to (through a family member/friend) a smartphone with video capability that we can use for your visit?" a. If yes - list this number in appt notes as "cell" (if different from BEST phone #) and list the appointment type as a VIDEO visit in appointment notes b. If no - list the appointment type as a PHONE visit in appointment notes  3. Confirm consent - "In the setting of the current Covid19 crisis, you are scheduled for a video visit with your provider on 03/25/2019 at 9:40AM.  Just as we do with many in-office visits, in order for you to participate in this visit, we must obtain consent.  If you'd like, I can send this to your mychart (if signed up) or email for you to review.  Otherwise, I can obtain your verbal consent now.  All virtual visits are billed to your insurance company just like a normal visit would be.  By agreeing to a virtual visit, we'd like you to understand that the technology does not allow for your provider to perform an examination, and thus may limit your provider's ability to fully assess your condition. If your provider identifies any concerns that need to be evaluated in person, we will make arrangements to do so.  Finally, though the technology is pretty good, we cannot assure that it will always work on either your or our end, and in the setting of a video visit, we may have to convert it to a phone-only visit.  In either situation, we cannot ensure that we have a secure connection.  Are you willing to proceed?" STAFF: Did the patient verbally acknowledge consent to telehealth visit? Document YES/NO  here: YES  4. Advise patient to be prepared - "Two hours prior to your appointment, go ahead and check your blood pressure, pulse, oxygen saturation, and your weight (if you have the equipment to check those) and write them all down. When your visit starts, your provider will ask you for this information. If you have an Apple Watch or Kardia device, please plan to have heart rate information ready on the day of your appointment. Please have a pen and paper handy nearby the day of the visit as well."  5. Give patient instructions for MyChart download to smartphone OR Doximity/Doxy.me as below if video visit (depending on what platform provider is using)  6. Inform patient they will receive a phone call 15 minutes prior to their appointment time (may be from unknown caller ID) so they should be prepared to answer    TELEPHONE CALL NOTE  Erik Bush has been deemed a candidate for a follow-up tele-health visit to limit community exposure during the Covid-19 pandemic. I spoke with the patient via phone to ensure availability of phone/video source, confirm preferred email & phone number, and discuss instructions and expectations.  I reminded Erik Bush to be prepared with any vital sign and/or heart rhythm information that could potentially be obtained via home monitoring, at the time of his visit. I reminded Erik Bush to expect a phone call prior to his visit.  Rajni Holsworth L  [name] 02/21/2019 1:37 PM   INSTRUCTIONS FOR DOWNLOADING THE MYCHART APP TO SMARTPHONE  - The patient must first make sure to have activated MyChart and know their login information - If Apple, go to [employer] and type in MyChart in the search bar and download the app. If Android, ask patient to go to [employer] and type in [place] in the search bar and download the app. The app is free but as with any other app downloads, their phone may require them to verify saved payment information or  Apple/Android password.  - The patient will need to then log into the app with their MyChart username and password, and select [facility] as their healthcare provider to link the account. When it is time for your visit, go to the MyChart app, find appointments, and click Begin Video Visit. Be sure to Select Allow for your device to access the Microphone and Camera for your visit. You will then be connected, and your provider will be with you shortly.  **If they have any issues connecting, or need assistance please contact MyChart service desk [phone])**  **If using a computer, in order to ensure the best quality for their visit they will need to use either of the following Internet Browsers: [employer], or Google Chrome**  IF USING DOXIMITY or DOXY.ME - The patient will receive a link just prior to their visit by text.     FULL LENGTH CONSENT FOR TELE-HEALTH VISIT   I hereby voluntarily request, consent and authorize [facility] and its employed or contracted physicians, physician assistants, nurse practitioners or other licensed health care professionals (the Practitioner), to provide me with telemedicine health care services (the "Services") as deemed necessary by the treating Practitioner. I acknowledge and consent to receive the Services by the Practitioner via telemedicine. I understand that the telemedicine visit will involve communicating with the Practitioner through live audiovisual communication technology and the disclosure of certain medical information by electronic transmission. I acknowledge that I have been given the opportunity to request an in-person assessment or other available alternative prior to the telemedicine visit and am voluntarily participating in the telemedicine visit.  I understand that I have the right to withhold or withdraw my consent to the use of telemedicine in the course of my care at any time, without affecting my right to future care  or treatment, and that the Practitioner or I may terminate the telemedicine visit at any time. I understand that I have the right to inspect all information obtained and/or recorded in the course of the telemedicine visit and may receive copies of available information for a reasonable fee.  I understand that some of the potential risks of receiving the Services via telemedicine include:  Marland Kitchen Delay or interruption in medical evaluation due to technological equipment failure or disruption; . Information transmitted may not be sufficient (e.g. poor resolution of images) to allow for appropriate medical decision making by the Practitioner; and/or  . In rare instances, security protocols could fail, causing a breach of personal health information.  Furthermore, I acknowledge that it is my responsibility to provide information about my medical history, conditions and care that is complete and accurate to the best of my ability. I acknowledge that Practitioner's advice, recommendations, and/or decision may be based on factors not within their control, such as incomplete or inaccurate data provided by me or distortions of diagnostic images or specimens that may result from electronic transmissions. I understand that the practice  of medicine is not an Chief Strategy Officer and that Practitioner makes no warranties or guarantees regarding treatment outcomes. I acknowledge that I will receive a copy of this consent concurrently upon execution via email to the email address I last provided but may also request a printed copy by calling the office of [place].    I understand that my insurance will be billed for this visit.   I have read or had this consent read to me. . I understand the contents of this consent, which adequately explains the benefits and risks of the Services being provided via telemedicine.  . I have been provided ample opportunity to ask questions regarding this consent and the Services and have had  my questions answered to my satisfaction. . I give my informed consent for the services to be provided through the use of telemedicine in my medical care  By participating in this telemedicine visit I agree to the above.

## 2019-03-22 ENCOUNTER — Telehealth: Payer: Self-pay | Admitting: Cardiovascular Disease

## 2019-03-22 NOTE — Telephone Encounter (Signed)

## 2019-03-23 NOTE — Progress Notes (Signed)
Cardiology Office Note  Date:  03/25/2019   ID:  Erik Bush, DOB [birth year], MRN [id number]  PCP:  Casilda Carls, MD   Chief Complaint  Patient presents with  . other    Patient c/o a little chest pain, swelling in legs and hands, Numbness in right thigh. Meds reviewed verbally with patient.     HPI:  Erik Bush is a 53 y.o. male with a hx of  CAD Smoker 2 packs/day, quit long time ago occluded proximal RCA,  stent x2 placed to the RCA in January 2019,  Hospitalization for symptoms March 2019 diagnosed with norovirus, ruled out Asa allergy, swelling of face osa uses CPAP Who presents for follow-up of his coronary disease  Little chest pain last week Daytime Comes on with walking, sometime with sitting Started this week 3/10, lasts 10-15 min Had 4 episodes Otherwise very active, no chest pain  Feet swelling, end of a long day  Right thigh numb Chronic issue  Hands swelling , worse in the morning, after walking  No new labs Has not seen primary care in some time, reports that his insurance has changed  EKG personally reviewed by myself on todays visit NSR rate 78 BPM, normal  Other past medical history reviewed Hospitalized November 25 2017 with shortness of breath, weakness, palpitations,  "legs giving out", appeared pale and anxious, reporting that he felt similar to when he had his heart attack.   Weakness, diarrhea  CT scan performed, stomach distended otherwise no aortic dissection or acute vascular process   head tremors Nitro drip held for hypotension Neurology was consult and by admitting physician for headache and head bobbing  Diarrhea tested positive for noro virus Family hx: Brother with congenital heart issue Denies any cardiac issues mother and father   PMH:   has a past medical history of Asthma, Coronary artery disease, Hyperlipidemia, Hypothermia, Hypothyroidism, Pneumonia (2016), Sleep apnea, and Wears contact lenses.  PSH:     Past Surgical History:  Procedure Laterality Date  . ANAL FISTULOTOMY N/A 06/08/2015   Procedure: ANAL FISTULOTOMY;  Surgeon: Christene Lye, MD;  Location: [facility];  Service: General;  Laterality: N/A;  . ANTERIOR CRUCIATE LIGAMENT REPAIR     knees  . CARDIAC CATHETERIZATION    . CLOSED REDUCTION NASAL FRACTURE N/A 03/22/2016   Procedure: CLOSED REDUCTION NASAL FRACTURE;  Surgeon: Clyde Canterbury, MD;  Location: [place];  Service: ENT;  Laterality: N/A;  sleep apnea  . LEFT HEART CATH AND CORONARY ANGIOGRAPHY N/A 09/14/2017   Procedure: LEFT HEART CATH AND CORONARY ANGIOGRAPHY and possible PCI;  Surgeon: Isaias Cowman, MD;  Location: [place] [facility];  Service: Cardiovascular;  Laterality: N/A;  . LIPOMA EXCISION N/A 04/05/2017   Procedure: EXCISION LIPOMA Nuchal area.  prone position;  Surgeon: Jules Husbands, MD;  Location: [facility];  Service: General;  Laterality: N/A;  Prone  . MEDIAL COLLATERAL LIGAMENT REPAIR, KNEE    . SHOULDER ARTHROSCOPY Right     Current Outpatient Medications  Medication Sig Dispense Refill  . atorvastatin (LIPITOR) 80 MG tablet Take 1 tablet (80 mg total) by mouth daily at 6 PM. 90 tablet 3  . clopidogrel (PLAVIX) 75 MG tablet Take 1 tablet (75 mg total) by mouth daily with breakfast. 90 tablet 3  . levothyroxine (SYNTHROID, LEVOTHROID) 125 MCG tablet Take 125 mcg by mouth daily before breakfast.    . metoprolol succinate (TOPROL-XL) 25 MG 24 hr tablet Take 1 tablet (25 mg total) by mouth  daily. 90 tablet 3  . Multiple Vitamins-Minerals (MULTIVITAMIN WITH MINERALS) tablet Take 1 tablet by mouth daily.    . sildenafil (REVATIO) 20 MG tablet Take 1 tablet (20 mg total) by mouth 3 (three) times daily as needed. 90 tablet 6  . Vitamin D, Ergocalciferol, (DRISDOL) 50000 units CAPS capsule Take 1 capsule by mouth every 7 (seven) days.   0   No current facility-administered medications for this visit.      Allergies:   Aspirin, Brussels  sprouts [brassica oleracea italica], Other, and Morphine and related   Social History:  The patient  reports that he quit smoking about 28 years ago. His smoking use included cigarettes. He has a 12.00 pack-year smoking history. He has never used smokeless tobacco. He reports current alcohol use. He reports that he does not use drugs.   Family History:   family history includes Heart Problems in his brother; Heart attack in an other family member; Thyroid disease in his father.    Review of Systems: Review of Systems  Constitutional: Negative.   Respiratory: Negative.   Cardiovascular: Positive for chest pain.  Gastrointestinal: Negative.   Musculoskeletal: Negative.        Hand swelling  Neurological: Negative.        Numbness and tingling right ulnar distribution, right anterior femoral cutaneous region  Psychiatric/Behavioral: Negative.   All other systems reviewed and are negative.   PHYSICAL EXAM: VS:  BP 112/80 (BP Location: Left Arm, Patient Position: Sitting, Cuff Size: Normal)   Pulse 78   Ht 5\' 7"  (1.702 m)   Wt 225 lb (102.1 kg)   BMI 35.24 kg/m  , BMI Body mass index is 35.24 kg/m. Constitutional:  oriented to person, place, and time. No distress.  HENT:  Head: Normocephalic and atraumatic.  Eyes:  no discharge. No scleral icterus.  Neck: Normal range of motion. Neck supple. No JVD present.  Cardiovascular: Normal rate, regular rhythm, normal heart sounds and intact distal pulses. Exam reveals no gallop and no friction rub. No edema No murmur heard. Pulmonary/Chest: Effort normal and breath sounds normal. No stridor. No respiratory distress.  no wheezes.  no rales.  no tenderness.  Abdominal: Soft.  no distension.  no tenderness.  Musculoskeletal: Normal range of motion.  no  tenderness or deformity.  Neurological:  normal muscle tone. Coordination normal. No atrophy Skin: Skin is warm and dry. No rash noted. not diaphoretic.  Psychiatric:  normal mood and affect.  behavior is normal. Thought content normal.    Recent Labs: No results found for requested labs within last 8760 hours.    Lipid Panel Lab Results  Component Value Date   CHOL 188 09/14/2017   HDL 36 (L) 09/14/2017   LDLCALC 107 (H) 09/14/2017   TRIG 225 (H) 09/14/2017      Wt Readings from Last 3 Encounters:  03/25/19 225 lb (102.1 kg)  01/15/18 210 lb 12 oz (95.6 kg)  11/27/17 209 lb 14.4 oz (95.2 kg)       ASSESSMENT AND PLAN:  Coronary artery disease of native artery of native heart with stable angina pectoris ([place]) - Plan: EKG 12-Lead Some atypical chest pain, discussed with him Does not sound like angina but recommended he call us if symptoms persist or get worse, stress test could be ordered Labs ordered  Mixed hyperlipidemia We have ordered labs today Goal LDL <70  OSA (obstructive sleep apnea) Managed by primary care  Essential hypertension Blood pressure is well controlled on today's  visit. No changes made to the medications.  Ulnar nerve entrapment at elbow, right Chronic issue  Thigh numbness Suspect the superficial cutaneous femoral nerve entrapment  Chronic issue  Erectile dysfunction, unspecified erectile dysfunction type Previously on viagra  Disposition:   F/U  12 months   Total encounter time more than 25 minutes  Greater than 50% was spent in counseling and coordination of care with the patient    Orders Placed This Encounter  Procedures  . EKG 12-Lead     Signed, Esmond Plants, M.D., Ph.D. 03/25/2019  [place], [place]

## 2019-03-25 ENCOUNTER — Encounter: Payer: Self-pay | Admitting: Cardiovascular Disease

## 2019-03-25 ENCOUNTER — Other Ambulatory Visit: Payer: Self-pay

## 2019-03-25 ENCOUNTER — Ambulatory Visit (INDEPENDENT_AMBULATORY_CARE_PROVIDER_SITE_OTHER): Payer: BC Managed Care – PPO | Admitting: Cardiovascular Disease

## 2019-03-25 VITALS — BP 112/80 | HR 78 | Ht 67.0 in | Wt 225.0 lb

## 2019-03-25 DIAGNOSIS — G4733 Obstructive sleep apnea (adult) (pediatric): Secondary | ICD-10-CM | POA: Diagnosis not present

## 2019-03-25 DIAGNOSIS — I10 Essential (primary) hypertension: Secondary | ICD-10-CM | POA: Diagnosis not present

## 2019-03-25 DIAGNOSIS — E782 Mixed hyperlipidemia: Secondary | ICD-10-CM | POA: Diagnosis not present

## 2019-03-25 DIAGNOSIS — I25118 Atherosclerotic heart disease of native coronary artery with other forms of angina pectoris: Secondary | ICD-10-CM

## 2019-03-25 NOTE — Patient Instructions (Addendum)
Medication Instructions:  No changes  If you need a refill on your cardiac medications before your next appointment, please call your pharmacy.    Lab work: Liver and lipids today   If you have labs (blood work) drawn today and your tests are completely normal, you will receive your results only by: Marland Kitchen MyChart Message (if you have MyChart) OR . A paper copy in the mail If you have any lab test that is abnormal or we need to change your treatment, we will call you to review the results.   Testing/Procedures: No new testing needed   Follow-Up: At [facility], you and your health needs are our priority.  As part of our continuing mission to provide you with exceptional heart care, we have created designated Provider Care Teams.  These Care Teams include your primary Cardiologist (physician) and Advanced Practice Providers (APPs -  Physician Assistants and Nurse Practitioners) who all work together to provide you with the care you need, when you need it.  . You will need a follow up appointment in 12 months .   Please call our office 2 months in advance to schedule this appointment.    . Providers on your designated Care Team:   . Murray Hodgkins, NP . Christell Faith, PA-C . Marrianne Mood, PA-C  Any Other Special Instructions Will Be Listed Below (If Applicable).  For educational health videos Log in to : [email or web address] Or : [email or web address], password : [email or web address]

## 2019-03-26 LAB — HEPATIC FUNCTION PANEL
ALT: 42 IU/L (ref 0–44)
AST: 22 IU/L (ref 0–40)
Albumin: 4.6 g/dL (ref 3.8–4.9)
Alkaline Phosphatase: 107 IU/L (ref 39–117)
Bilirubin Total: 0.6 mg/dL (ref 0.0–1.2)
Bilirubin, Direct: 0.16 mg/dL (ref 0.00–0.40)
Total Protein: 7.2 g/dL (ref 6.0–8.5)

## 2019-03-26 LAB — LIPID PANEL
Chol/HDL Ratio: 3.4 ratio (ref 0.0–5.0)
Cholesterol, Total: 130 mg/dL (ref 100–199)
HDL: 38 mg/dL — ABNORMAL LOW (ref >39)
LDL Calculated: 76 mg/dL (ref 0–99)
Triglycerides: 82 mg/dL (ref 0–149)
VLDL Cholesterol Cal: 16 mg/dL (ref 5–40)

## 2019-04-04 MED ORDER — ATORVASTATIN CALCIUM 80 MG PO TABS: 80.0000 mg | ORAL_TABLET | Freq: Every day | ORAL | 3 refills | Status: AC

## 2019-04-04 NOTE — Addendum Note (Signed)
Addended by: Vanessa Ralphs on: 04/04/2019 02:14 PM   Modules accepted: Orders

## 2019-04-17 ENCOUNTER — Emergency Department
Admission: EM | Admit: 2019-04-17 | Discharge: 2019-04-18 | Disposition: A | Discharge status: Other Acute Inpatient Hospital | Payer: No Typology Code available for payment source | Attending: Emergency Medicine | Admitting: Emergency Medicine

## 2019-04-17 ENCOUNTER — Other Ambulatory Visit: Payer: Self-pay

## 2019-04-17 ENCOUNTER — Emergency Department: Payer: No Typology Code available for payment source

## 2019-04-17 DIAGNOSIS — I10 Essential (primary) hypertension: Secondary | ICD-10-CM | POA: Insufficient documentation

## 2019-04-17 DIAGNOSIS — E039 Hypothyroidism, unspecified: Secondary | ICD-10-CM | POA: Diagnosis not present

## 2019-04-17 DIAGNOSIS — R0789 Other chest pain: Secondary | ICD-10-CM | POA: Diagnosis present

## 2019-04-17 DIAGNOSIS — Z87891 Personal history of nicotine dependence: Secondary | ICD-10-CM | POA: Diagnosis not present

## 2019-04-17 DIAGNOSIS — I252 Old myocardial infarction: Secondary | ICD-10-CM | POA: Diagnosis not present

## 2019-04-17 DIAGNOSIS — Z7902 Long term (current) use of antithrombotics/antiplatelets: Secondary | ICD-10-CM | POA: Insufficient documentation

## 2019-04-17 DIAGNOSIS — I2510 Atherosclerotic heart disease of native coronary artery without angina pectoris: Secondary | ICD-10-CM | POA: Diagnosis not present

## 2019-04-17 DIAGNOSIS — Z79899 Other long term (current) drug therapy: Secondary | ICD-10-CM | POA: Insufficient documentation

## 2019-04-17 DIAGNOSIS — R079 Chest pain, unspecified: Secondary | ICD-10-CM

## 2019-04-17 DIAGNOSIS — J45909 Unspecified asthma, uncomplicated: Secondary | ICD-10-CM | POA: Insufficient documentation

## 2019-04-17 DIAGNOSIS — U071 COVID-19: Secondary | ICD-10-CM

## 2019-04-17 DIAGNOSIS — R509 Fever, unspecified: Secondary | ICD-10-CM

## 2019-04-17 DIAGNOSIS — R0902 Hypoxemia: Secondary | ICD-10-CM | POA: Diagnosis not present

## 2019-04-17 LAB — CBC WITH DIFFERENTIAL/PLATELET
Abs Immature Granulocytes: 0.01 10*3/uL (ref 0.00–0.07)
Basophils Absolute: 0 10*3/uL (ref 0.0–0.1)
Basophils Relative: 0 %
Eosinophils Absolute: 0 10*3/uL (ref 0.0–0.5)
Eosinophils Relative: 1 %
HCT: 42.5 % (ref 39.0–52.0)
Hemoglobin: 14.5 g/dL (ref 13.0–17.0)
Immature Granulocytes: 0 %
Lymphocytes Relative: 23 %
Lymphs Abs: 0.7 10*3/uL (ref 0.7–4.0)
MCH: 28.5 pg (ref 26.0–34.0)
MCHC: 34.1 g/dL (ref 30.0–36.0)
MCV: 83.5 fL (ref 80.0–100.0)
Monocytes Absolute: 0.3 10*3/uL (ref 0.1–1.0)
Monocytes Relative: 9 %
Neutro Abs: 1.9 10*3/uL (ref 1.7–7.7)
Neutrophils Relative %: 67 %
Platelets: 197 10*3/uL (ref 150–400)
RBC: 5.09 MIL/uL (ref 4.22–5.81)
RDW: 12.6 % (ref 11.5–15.5)
WBC: 2.9 10*3/uL — ABNORMAL LOW (ref 4.0–10.5)
nRBC: 0 % (ref 0.0–0.2)

## 2019-04-17 LAB — COMPREHENSIVE METABOLIC PANEL
ALT: 53 U/L — ABNORMAL HIGH (ref 0–44)
AST: 39 U/L (ref 15–41)
Albumin: 4.1 g/dL (ref 3.5–5.0)
Alkaline Phosphatase: 67 U/L (ref 38–126)
Anion gap: 11 (ref 5–15)
BUN: 12 mg/dL (ref 6–20)
CO2: 21 mmol/L — ABNORMAL LOW (ref 22–32)
Calcium: 8.1 mg/dL — ABNORMAL LOW (ref 8.9–10.3)
Chloride: 105 mmol/L (ref 98–111)
Creatinine, Ser: 0.72 mg/dL (ref 0.61–1.24)
GFR calc Af Amer: >60 mL/min (ref >60)
GFR calc non Af Amer: >60 mL/min (ref >60)
Glucose, Bld: 120 mg/dL — ABNORMAL HIGH (ref 70–99)
Potassium: 3.6 mmol/L (ref 3.5–5.1)
Sodium: 137 mmol/L (ref 135–145)
Total Bilirubin: 0.7 mg/dL (ref 0.3–1.2)
Total Protein: 7.5 g/dL (ref 6.5–8.1)

## 2019-04-17 LAB — PROTIME-INR
INR: 1 (ref 0.8–1.2)
Prothrombin Time: 13.3 seconds (ref 11.4–15.2)

## 2019-04-17 LAB — LACTIC ACID, PLASMA: Lactic Acid, Venous: 1.5 mmol/L (ref 0.5–1.9)

## 2019-04-17 MED ORDER — ACETAMINOPHEN 500 MG PO TABS
ORAL_TABLET | ORAL | Status: AC
  Administered 2019-04-17: 1000 mg via ORAL
  Filled 2019-04-17: qty 2

## 2019-04-17 MED ORDER — ACETAMINOPHEN 500 MG PO TABS
1000.0000 mg | ORAL_TABLET | Freq: Once | ORAL | Status: AC
  Administered 2019-04-17: 23:00:00 1000 mg via ORAL

## 2019-04-17 NOTE — ED Notes (Signed)
Patient sat up on room air and dropped to 89% and felt dizzy so did not ambulate patient.

## 2019-04-17 NOTE — ED Provider Notes (Signed)
[facility] Emergency Department Provider Note   ____________________________________________   First MD Initiated Contact with Patient 04/17/19 2311     (approximate)  I have reviewed the triage vital signs and the nursing notes.   HISTORY  Chief Complaint Chest Pain    HPI Erik Bush is a 53 y.o. male brought to the ED from home via EMS with a chief complaint of chest pain and shortness of breath.  Patient was hospitalized last week at the [facility] for shortness of breath due to COVID-19.  He was discharged on Monday.  Over the course of the week patient has become progressively short of breath along with nonproductive cough and low-grade temperatures.  Tonight presents with right-sided chest discomfort.  Denies abdominal pain, nausea, vomiting or diarrhea.       Past Medical History:  Diagnosis Date  . Asthma   . Coronary artery disease   . Hyperlipidemia   . Hypothermia   . Hypothyroidism   . Pneumonia 2016  . Sleep apnea    USES CPAP  . Wears contact lenses     Patient Active Problem List   Diagnosis Date Noted  . Essential hypertension 01/15/2018  . Ulnar nerve entrapment at elbow, right 01/15/2018  . Thigh numbness 01/15/2018  . Erectile dysfunction 01/15/2018  . Hyperlipidemia 01/14/2018  . OSA (obstructive sleep apnea) 01/14/2018  . Chest pain 11/25/2017  . CAD (coronary artery disease) 11/25/2017  . Abnormal EKG 11/25/2017  . Headache 11/25/2017  . Non-STEMI (non-ST elevated myocardial infarction) ([place]) 09/13/2017  . Neck mass     Past Surgical History:  Procedure Laterality Date  . ANAL FISTULOTOMY N/A 06/08/2015   Procedure: ANAL FISTULOTOMY;  Surgeon: Christene Lye, MD;  Location: [facility];  Service: General;  Laterality: N/A;  . ANTERIOR CRUCIATE LIGAMENT REPAIR     knees  . CARDIAC CATHETERIZATION    . CLOSED REDUCTION NASAL FRACTURE N/A 03/22/2016   Procedure: CLOSED REDUCTION NASAL FRACTURE;  Surgeon: Clyde Canterbury, MD;  Location: [place];  Service: ENT;  Laterality: N/A;  sleep apnea  . LEFT HEART CATH AND CORONARY ANGIOGRAPHY N/A 09/14/2017   Procedure: LEFT HEART CATH AND CORONARY ANGIOGRAPHY and possible PCI;  Surgeon: Isaias Cowman, MD;  Location: [place] [facility];  Service: Cardiovascular;  Laterality: N/A;  . LIPOMA EXCISION N/A 04/05/2017   Procedure: EXCISION LIPOMA Nuchal area.  prone position;  Surgeon: Jules Husbands, MD;  Location: [facility];  Service: General;  Laterality: N/A;  Prone  . MEDIAL COLLATERAL LIGAMENT REPAIR, KNEE    . SHOULDER ARTHROSCOPY Right     Prior to Admission medications   Medication Sig Start Date End Date Taking? Authorizing Provider  atorvastatin (LIPITOR) 80 MG tablet Take 1 tablet (80 mg total) by mouth daily at 6 PM. Patient taking differently: Take 40 mg by mouth at bedtime.  04/04/19  Yes Gollan, Kathlene November, MD  benzonatate (TESSALON) 200 MG capsule Take 200 mg by mouth every 8 (eight) hours as needed for cough.   Yes [provider]  capsaicin (ZOSTRIX) 0.025 % cream Apply 1 application topically 4 (four) times daily as needed (pain).   Yes [provider]  clopidogrel (PLAVIX) 75 MG tablet Take 1 tablet (75 mg total) by mouth daily with breakfast. 01/15/18  Yes Gollan, Kathlene November, MD  diclofenac sodium (VOLTAREN) 1 % GEL Apply 2 g topically 4 (four) times daily as needed (pain).   Yes [provider]  guaifenesin (ROBITUSSIN)  100 MG/5ML syrup Take 200 mg by mouth every 8 (eight) hours as needed for cough.   Yes [provider]  levothyroxine (SYNTHROID, LEVOTHROID) 125 MCG tablet Take 125 mcg by mouth daily before breakfast.   Yes [provider]  metoprolol succinate (TOPROL-XL) 25 MG 24 hr tablet Take 1 tablet (25 mg total) by mouth daily. 01/15/18  Yes Gollan, Kathlene November, MD  Multiple Vitamins-Minerals (MULTIVITAMIN WITH MINERALS) tablet Take 1 tablet by mouth daily.   Yes [provider]  sildenafil (REVATIO) 20 MG tablet Take 1 tablet (20 mg total) by mouth 3 (three) times daily as needed. 01/15/18  Yes Gollan, Kathlene November, MD  Vitamin D, Ergocalciferol, (DRISDOL) 50000 units CAPS capsule Take 1 capsule by mouth every 7 (seven) days.  10/06/17  Yes [provider]    Allergies Aspirin, Brussels sprouts [brassica oleracea italica], Other, and Morphine and related  Family History  Problem Relation Age of Onset  . Thyroid disease Father   . Heart attack Other   . Heart Problems Brother     Social History Social History   Tobacco Use  . Smoking status: Former Smoker    Packs/day: 2.00    Years: 6.00    Pack years: 12.00    Types: Cigarettes    Quit date: 09/12/1990    Years since quitting: 28.6  . Smokeless tobacco: Never Used  Substance Use Topics  . Alcohol use: Yes    Alcohol/week: 0.0 standard drinks    Comment: occassionally  . Drug use: No    Review of Systems  Constitutional: Positive for fever/chills Eyes: No visual changes. ENT: No sore throat. Cardiovascular: Positive for chest pain. Respiratory: Positive for cough and shortness of breath. Gastrointestinal: No abdominal pain.  No nausea, no vomiting.  No diarrhea.  No constipation. Genitourinary: Negative for dysuria. Musculoskeletal: Negative for back pain. Skin: Negative for rash. Neurological: Negative for headaches, focal weakness or numbness.   ____________________________________________   PHYSICAL EXAM:  VITAL SIGNS: ED Triage Vitals  Enc Vitals Group     BP 04/17/19 2224 109/69     Pulse Rate 04/17/19 2223 99     Resp 04/17/19 2223 (!) 26     Temp 04/17/19 2234 (!) 102.2 F (39 C)     Temp Source 04/17/19 2234 Oral     SpO2 04/17/19 2223 96 %     Weight 04/17/19 2228 219 lb (99.3 kg)     Height 04/17/19 2228 5\' 8"  (1.727 m)     Head Circumference --      Peak Flow --      Pain Score 04/17/19 2223 10     Pain Loc --      Pain Edu? --      Excl. in [place]? --      Constitutional: Alert and oriented.  Uncomfortable appearing and in mild acute distress. Eyes: Conjunctivae are normal. PERRL. EOMI. Head: Atraumatic. Nose: No congestion/rhinnorhea. Mouth/Throat: Mucous membranes are moist.  Oropharynx non-erythematous. Neck: No stridor.   Cardiovascular: Normal rate, regular rhythm. Grossly normal heart sounds.  Good peripheral circulation. Respiratory: Increased respiratory effort.  No retractions. Lungs with scattered rhonchi. Gastrointestinal: Soft and nontender. No distention. No abdominal bruits. No CVA tenderness. Musculoskeletal: No lower extremity tenderness nor edema.  No joint effusions. Neurologic:  Normal speech and language. No gross focal neurologic deficits are appreciated.  Skin:  Skin is warm, dry and intact. No rash noted. Psychiatric: Mood and affect are normal. Speech and behavior are  normal.  ____________________________________________   LABS (all labs ordered are listed, but only abnormal results are displayed)  Labs Reviewed  SARS CORONAVIRUS 2 ([place] [place] [facility]) - Abnormal; Notable for the following components:      Result Value   SARS Coronavirus 2 POSITIVE (*)    All other components within normal limits  COMPREHENSIVE METABOLIC PANEL - Abnormal; Notable for the following components:   CO2 21 (*)    Glucose, Bld 120 (*)    Calcium 8.1 (*)    ALT 53 (*)    All other components within normal limits  CBC WITH DIFFERENTIAL/PLATELET - Abnormal; Notable for the following components:   WBC 2.9 (*)    All other components within normal limits  CULTURE, BLOOD (ROUTINE X 2)  CULTURE, BLOOD (ROUTINE X 2)  LACTIC ACID, PLASMA  PROTIME-INR  LACTIC ACID, PLASMA  TROPONIN I (HIGH SENSITIVITY)   ____________________________________________  EKG  ED ECG REPORT I, , J, the attending physician, personally viewed and interpreted this ECG.   Date: 04/18/2019  EKG Time: 2224  Rate:  101  Rhythm: sinus tachycardia  Axis: Normal  Intervals:none  ST&T Change: Nonspecific  ____________________________________________  RADIOLOGY  ED MD interpretation: Scattered opacities which could be secondary to pneumonia  Official radiology report(s): Ct Angio Chest Pe W/cm &/or Wo Cm  Result Date: 04/18/2019 CLINICAL DATA:  COVID-19 positive. Chest pain, shortness of breath, productive cough. EXAM: CT ANGIOGRAPHY CHEST WITH CONTRAST TECHNIQUE: Multidetector CT imaging of the chest was performed using the standard protocol during bolus administration of intravenous contrast. Multiplanar CT image reconstructions and MIPs were obtained to evaluate the vascular anatomy. CONTRAST:  141mL OMNIPAQUE IOHEXOL 350 MG/ML SOLN COMPARISON:  11/25/2017 FINDINGS: Cardiovascular: No filling defects in the pulmonary arteries to suggest pulmonary emboli. Heart is normal size. Aorta is normal caliber. Coronary artery calcifications, diffuse in the right coronary artery and moderate in the left anterior descending and left circumflex coronary arteries. Mediastinum/Nodes: No mediastinal, hilar, or axillary adenopathy. Lungs/Pleura: There are patchy ground-glass opacities in the lungs bilaterally, most notable peripherally in the right upper lobe and both lower lobes. No effusions. Upper Abdomen: Imaging into the upper abdomen shows no acute findings. Musculoskeletal: Chest wall soft tissues are unremarkable. No acute bony abnormality. Review of the MIP images confirms the above findings. IMPRESSION: No pulmonary embolus. Age advanced coronary artery disease. Patchy peripheral ground-glass opacities in the lungs may reflect atypical/viral pneumonia. This would be compatible with COVID-19 pneumonia. Electronically Signed   By: Rolm Baptise M.D.   On: 04/18/2019 01:41   Dg Chest Portable 1 View  Result Date: 04/17/2019 CLINICAL DATA:  Chest pain productive cough with shortness of breath EXAM: PORTABLE CHEST 1 VIEW  COMPARISON:  11/25/2017 FINDINGS: Low lung volumes. Scattered perihilar and basilar opacities. Borderline cardiomegaly. No pneumothorax IMPRESSION: Low lung volumes. Scattered perihilar and vague basilar opacities which could be secondary to pneumonia Electronically Signed   By: Donavan Foil M.D.   On: 04/17/2019 22:52    ____________________________________________   PROCEDURES  Procedure(s) performed (including Critical Care):  Procedures  CRITICAL CARE Performed by: Paulette Blanch   Total critical care time: 30 minutes  Critical care time was exclusive of separately billable procedures and treating other patients.  Critical care was necessary to treat or prevent imminent or life-threatening deterioration.  Critical care was time spent personally by me on the following activities: development of treatment plan with patient and/or surrogate as well as nursing, discussions with  consultants, evaluation of patient's response to treatment, examination of patient, obtaining history from patient or surrogate, ordering and performing treatments and interventions, ordering and review of laboratory studies, ordering and review of radiographic studies, pulse oximetry and re-evaluation of patient's condition. ____________________________________________   INITIAL IMPRESSION / ASSESSMENT AND PLAN / ED COURSE  As part of my medical decision making, I reviewed the following data within the [facility] notes reviewed and incorporated, Labs reviewed, EKG interpreted, Old chart reviewed, Radiograph reviewed and Notes from prior ED visits     Erik Bush was evaluated in Emergency Department on 04/18/2019 for the symptoms described in the history of present illness. He was evaluated in the context of the global COVID-19 pandemic, which necessitated consideration that the patient might be at risk for infection with the SARS-CoV-2 virus that causes COVID-19. Institutional  protocols and algorithms that pertain to the evaluation of patients at risk for COVID-19 are in a state of rapid change based on information released by regulatory bodies including the CDC and federal and state organizations. These policies and algorithms were followed during the patient's care in the ED.   53 year old male who presents with increased cough and shortness of breath in the setting of COVID-19 status post recent hospitalization. Differential includes, but is not limited to, viral syndrome, bronchitis including COPD exacerbation, pneumonia, reactive airway disease including asthma, CHF including exacerbation with or without pulmonary/interstitial edema, pneumothorax, ACS, thoracic trauma, and pulmonary embolism.  CBC, lactic acid unremarkable.  X-ray demonstrates possible patchy pneumonia.  Awaiting results of COVID and troponin.  Nursing tried to ambulate patient on room air but he desatted to 87-88% just sitting up in the bed.  2 L nasal cannula oxygen reapplied.  Patient desires to return to the [place] for hospitalization.   Clinical Course as of Apr 18 635  Thu Apr 18, 2019  0039 Patient is Covid positive.  Will initiate transfer process for the [place].  Given patient's COVID positive status, coupled with tachypnea, chest pain or shortness of breath, will obtain CTA chest to evaluate for pulmonary embolus.   [JS]  0225 CT demonstrates no PE; consistent with COVID-19. Awaiting callback from the [place].   [JS]  [place] tells [place] there will be no telemetry beds until noon at the earliest.  Patient given the option to be transferred to [facility] but he insisted on transferring to the [place]. Home meds ordered.   [JS]  [id number] Accepted by Dr. Audery Amel from the [facility]. Awaiting bed assignment.   [JS]    Clinical Course User Index [JS] Paulette Blanch, MD     ____________________________________________   FINAL CLINICAL IMPRESSION(S) / ED DIAGNOSES  Final diagnoses:  Nonspecific chest pain   Fever, unspecified fever cause  COVID-19 virus infection  Hypoxia     ED Discharge Orders    None       Note:  This document was prepared using Dragon voice recognition software and may include unintentional dictation errors.   Paulette Blanch, MD 04/18/19 [phone]

## 2019-04-17 NOTE — ED Notes (Signed)
Pt placed on 2L  for comfort.

## 2019-04-17 NOTE — ED Triage Notes (Addendum)
Pt arrives via EMS after being seen at the [place] 5 days ago- pt is covid positive- pt having chest pain, SHOB, and productive cough- pt breathing laboured, sats are 95% on RA

## 2019-04-18 ENCOUNTER — Emergency Department: Payer: No Typology Code available for payment source

## 2019-04-18 ENCOUNTER — Encounter: Payer: Self-pay | Admitting: Radiology

## 2019-04-18 ENCOUNTER — Ambulatory Visit (HOSPITAL_COMMUNITY)
Admission: AD | Admit: 2019-04-18 | Discharge: 2019-04-18 | Disposition: A | Discharge status: Home / Self Care | Payer: No Typology Code available for payment source | Source: Other Acute Inpatient Hospital | Attending: Emergency Medicine | Admitting: Emergency Medicine

## 2019-04-18 DIAGNOSIS — R0902 Hypoxemia: Secondary | ICD-10-CM | POA: Diagnosis present

## 2019-04-18 LAB — SARS CORONAVIRUS 2 BY RT PCR (HOSPITAL ORDER, PERFORMED IN ~~LOC~~ HOSPITAL LAB): SARS Coronavirus 2: POSITIVE — AB

## 2019-04-18 LAB — TROPONIN I (HIGH SENSITIVITY): Troponin I (High Sensitivity): 3 ng/L (ref <18)

## 2019-04-18 MED ORDER — IOHEXOL 350 MG/ML SOLN
100.0000 mL | Freq: Once | INTRAVENOUS | Status: AC | PRN
  Administered 2019-04-18: 100 mL via INTRAVENOUS

## 2019-04-18 MED ORDER — CLOPIDOGREL BISULFATE 75 MG PO TABS
75.0000 mg | ORAL_TABLET | Freq: Every day | ORAL | Status: DC
  Filled 2019-04-18: qty 1

## 2019-04-18 MED ORDER — LEVOTHYROXINE SODIUM 50 MCG PO TABS
125.0000 ug | ORAL_TABLET | Freq: Every day | ORAL | Status: DC
  Filled 2019-04-18: qty 3

## 2019-04-18 MED ORDER — METOPROLOL SUCCINATE ER 50 MG PO TB24: 25.0000 mg | ORAL_TABLET | Freq: Every day | ORAL | Status: DC

## 2019-04-18 MED ORDER — SODIUM CHLORIDE 0.9 % IV BOLUS
1000.0000 mL | Freq: Once | INTRAVENOUS | Status: AC
  Administered 2019-04-18: 1000 mL via INTRAVENOUS

## 2019-04-18 MED ORDER — ATORVASTATIN CALCIUM 20 MG PO TABS: 80.0000 mg | ORAL_TABLET | Freq: Every day | ORAL | Status: DC

## 2019-04-18 NOTE — ED Notes (Signed)
[facility] [place] [facility] notified patient en route with [facility], spoke with Baxter Flattery in the Emergency Room.

## 2019-04-18 NOTE — ED Notes (Signed)
Pt's wife notified of patient's transfer to [place] via [place].

## 2019-04-18 NOTE — ED Notes (Signed)
Pt transferred to [facility] via [place] at this time.

## 2019-04-18 NOTE — ED Notes (Signed)
Patient stated he wanted to be transfered to [facility] hospital

## 2019-04-18 NOTE — ED Notes (Signed)
Pt given urinal to use the bathroom at this time.

## 2019-04-22 LAB — CULTURE, BLOOD (ROUTINE X 2)
Culture: NO GROWTH
Culture: NO GROWTH
Special Requests: ADEQUATE

## 2022-01-24 ENCOUNTER — Telehealth: Payer: Self-pay | Admitting: Cardiovascular Disease

## 2022-01-24 NOTE — Telephone Encounter (Signed)
3 attempts to schedule fu appt from recall list.   Deleting recall.
